# Patient Record
Sex: Female | Born: 1965 | Race: White | Hispanic: No | Marital: Married | Smoking: Current every day smoker
Health system: Southern US, Community
[De-identification: ages and names within clinical notes are randomized; demographics above are authoritative.]

## PROBLEM LIST (undated history)

## (undated) DIAGNOSIS — J309 Allergic rhinitis, unspecified: Secondary | ICD-10-CM

## (undated) DIAGNOSIS — K449 Diaphragmatic hernia without obstruction or gangrene: Secondary | ICD-10-CM

## (undated) DIAGNOSIS — Z91038 Other insect allergy status: Secondary | ICD-10-CM

## (undated) DIAGNOSIS — K5792 Diverticulitis of intestine, part unspecified, without perforation or abscess without bleeding: Secondary | ICD-10-CM

## (undated) DIAGNOSIS — J329 Chronic sinusitis, unspecified: Secondary | ICD-10-CM

## (undated) DIAGNOSIS — E222 Syndrome of inappropriate secretion of antidiuretic hormone: Secondary | ICD-10-CM

## (undated) DIAGNOSIS — Z1211 Encounter for screening for malignant neoplasm of colon: Secondary | ICD-10-CM

## (undated) DIAGNOSIS — M654 Radial styloid tenosynovitis [de Quervain]: Secondary | ICD-10-CM

## (undated) DIAGNOSIS — I10 Essential (primary) hypertension: Secondary | ICD-10-CM

## (undated) DIAGNOSIS — J302 Other seasonal allergic rhinitis: Secondary | ICD-10-CM

## (undated) DIAGNOSIS — E782 Mixed hyperlipidemia: Principal | ICD-10-CM

## (undated) DIAGNOSIS — E871 Hypo-osmolality and hyponatremia: Secondary | ICD-10-CM

## (undated) DIAGNOSIS — N3001 Acute cystitis with hematuria: Principal | ICD-10-CM

## (undated) HISTORY — DX: Other insect allergy status: Z91.038

## (undated) HISTORY — DX: Diverticulitis of intestine, part unspecified, without perforation or abscess without bleeding: K57.92

## (undated) HISTORY — DX: Allergic rhinitis, unspecified: J30.9

## (undated) HISTORY — DX: Diaphragmatic hernia without obstruction or gangrene: K44.9

---

## 2008-04-03 NOTE — Op Note (Unsigned)
PATIENT NAME                  PA #             MR #                Susan [name], Susan [name]                 [id number]       [id number]          SURGEON                                 SURG DATE   DIS DATE         Elson Clan, MD                   04/03/2008  04/03/2008       DATE OF BIRTH    AGE            PATIENT   TYPE      RM #             18-Nov-1965       42             SDA                                 ENDOSCOPY. HISTORY:  The patient is a 43 year old female with   a three month history ofpyrosis, Nexium resulted in headaches, Kapidex has   been only partiallyhelpful.  She denies dysphagia, odynophagia or melenic   stools. MONITORING:  Oxygen, saturation, blood pressure and heart rate were   monitoredcontinuously. MEDICATIONS FOR THE PROCEDURE:  Fentanyl 100   micrograms, Versed 5 mg. DESCRIPTION OF PROCEDURE:  Informed consent was   obtained. The patients pastmedical history, medication list and allergies were   reviewed.  The risks andbenefits and alternatives of procedures were as well   discussed.  The patientwas then placed in the left lateral decubitus position,   IV sedation wasstarted in sequential fashion until the appropriate level of   consciousnesswas achieved.  Hurricaine spray was applied to the back of the   throat.  Theendoscope was then advanced in direct visualization of the tongue,   esophagusand duodenum.  Duodenum mucosal abnormalities were not visualized.    Antegradeand retrograde views of the stomach demonstrated a small hiatal   hernia.  Thescope was then withdrawn through the hiatal hernia and a regular   Z-line wasappreciated. Biopsies were obtained to rule out Barretts esophagus.    Furtherexamination of the esophagus failed to demonstrate obvious   abnormalities. The scope was then withdrawn, the patients procedure terminated   and welltolerated, no immediate complications.   POSTPROCEDURE DIAGNOSIS:  A   regular Z-line hiatal hernia.   I suspect her symptoms are related to acid    reflux.  I have asked her to startProtonix and to discontinue Kapidex and   followup in my office in March.  Iwill contact her with biopsy results.                            Elson Clan, MD [place] /[id number] DD: 04/03/2008  DT:   04/04/2008  Job #: [id number] CC:

## 2008-11-17 LAB — POCT RAPID STREP A: Strep A Ag: NOT DETECTED

## 2008-11-17 NOTE — Progress Notes (Signed)
SUBJECTIVE:   Susan [name] is a 43 y.o. female who complains of congestion, sore throat and headache for 2 days. She denies a history of fevers and denies a history of asthma. Patient admits to smoke cigarettes.     OBJECTIVE:  She appears well, vital signs are as noted. Ears normal.  Throat and pharynx normal.  Neck supple. No adenopathy in the neck. Nose is congested. Sinuses non tender. The chest is clear, without wheezes or rales.    ASSESSMENT:   viral upper respiratory illness    PLAN:  Symptomatic therapy suggested: push fluids, rest and return office visit prn if symptoms persist or worsen. Lack of antibiotic effectiveness discussed with her. Call or return to clinic prn if these symptoms worsen or fail to improve as anticipated. Will write rx for antibiotic as [place] her in case symptoms worsen.

## 2009-01-19 NOTE — Telephone Encounter (Signed)
Suggested the pt be seen before she starts the antibiotic

## 2009-01-19 NOTE — Telephone Encounter (Signed)
Patient [place]  Now has a sinus infection, can she start taking this to help her kick this (sinus/throat/congestion/drainage/some SOB because she coughs so much), can you call her tonight, advise

## 2009-01-20 MED ORDER — AMOXICILLIN-POT CLAVULANATE 875-125 MG PO TABS
875-125 | ORAL_TABLET | Freq: Two times a day (BID) | ORAL | 0.00 refills | 9.50000 days | Status: AC
Start: 2009-01-20 — End: 2009-02-03

## 2009-01-20 MED ORDER — FLUTICASONE PROPIONATE 50 MCG/ACT NA SUSP
50 | Freq: Every day | NASAL | 0.00 refills | 60.00000 days | Status: DC
Start: 2009-01-20 — End: 2009-07-24

## 2009-01-20 NOTE — Progress Notes (Signed)
Subjective:      Patient ID: Susan [name] is a 43 y.o. female.    Sinusitis  This is a new problem. Episode onset: 3 weeks. The problem has been gradually worsening since onset. There has been no fever. The pain is moderate. Associated symptoms include congestion, coughing and sinus pressure. Pertinent negatives include no shortness of breath. She has tried oral decongestants (zyrtec-d,mucinex) for the symptoms. The treatment provided no relief.       Review of Systems   HENT: Positive for congestion, rhinorrhea, postnasal drip and sinus pressure.    Respiratory: Positive for cough. Negative for shortness of breath.        Objective:   Physical Exam   Vitals reviewed.  Constitutional: She appears well-developed. No distress.   HENT:   Right Ear: External ear normal.   Mouth/Throat: Oropharynx is clear and moist. No oropharyngeal exudate.   Cardiovascular: Normal rate, regular rhythm and normal heart sounds.    Pulmonary/Chest: Effort normal and breath sounds normal. She has no wheezes. She has no rales.   Abdominal: Soft.   Lymphadenopathy:     She has no cervical adenopathy.       Assessment:      sinusitis      Plan:      augmentin 875 bid for 10-14 d , flonase , mucinex

## 2009-01-24 ENCOUNTER — Inpatient Hospital Stay: Admit: 2009-01-24 | Discharge: 2009-01-27

## 2009-01-24 LAB — CBC WITH AUTO DIFFERENTIAL
Basophils %: 2.7 % — ABNORMAL HIGH (ref 0.0–2.0)
Basophils Absolute: 0.2 10*3 (ref 0.0–0.2)
Eosinophils %: 0.7 % (ref 0.0–5.0)
Eosinophils Absolute: 0.1 10*3 (ref 0.0–0.6)
Granulocyte Absolute Count: 6.3 10*3 (ref 1.7–7.7)
Hematocrit: 39.6 % (ref 36.0–48.0)
Hemoglobin: 13.3 g/dL (ref 12.0–16.0)
Lymphocytes %: 15.6 % — ABNORMAL LOW (ref 25.0–40.0)
Lymphocytes Absolute: 1.3 10*3 (ref 1.0–5.1)
MCH: 32.8 pg (ref 26–34)
MCHC: 33.7 g/dL (ref 31–36)
MCV: 97.6 fl (ref 80–100)
MPV: 7 fl (ref 5.0–10.5)
Monocytes %: 7.3 %
Monocytes Absolute: 0.6 10*3
Platelets: 337 10*3 (ref 135–450)
RBC: 4.06 10*6 (ref 4.0–5.2)
RDW: 13.8 % (ref 11.5–14.5)
Segs Relative: 73.7 % — ABNORMAL HIGH (ref 42.0–63.0)
WBC: 8.5 10*3 (ref 4.0–11.0)

## 2009-01-24 LAB — POCT GLUCOSE: POC Glucose: 84 mg/dL (ref 70–99)

## 2009-01-24 LAB — URINALYSIS WITH MICROSCOPIC
Bilirubin, Urine: NEGATIVE
Glucose, UA: NEGATIVE
Ketones, Urine: NEGATIVE
Leukocyte Esterase, Urine: NEGATIVE
Nitrite, Urine: NEGATIVE
Protein, UA: NEGATIVE
Specific Gravity, UA: 1.01
Urobilinogen, Urine: 0.2 EU/dl (ref <2.0)
pH, UA: 7 (ref 4.5–8.0)

## 2009-01-24 LAB — BASIC METABOLIC PANEL
BUN: 6 mg/dL — ABNORMAL LOW (ref 7–18)
CO2: 24 meq/L (ref 21–32)
Calcium: 9.2 mg/dL (ref 8.3–10.6)
Chloride: 105 meq/L (ref 99–110)
Creatinine: 0.7 mg/dL (ref 0.6–1.1)
GFR Est, African/Amer: >60
GFR, Estimated: >60 (ref >60)
Glucose: 94 mg/dL (ref 70–99)
Potassium: 5.3 meq/L — ABNORMAL HIGH (ref 3.5–5.1)
Sodium: 138 meq/L (ref 136–145)

## 2009-01-24 LAB — TROPONIN: Troponin I: <0.006 ng/ml

## 2009-01-24 NOTE — Telephone Encounter (Signed)
Dr Braulio Bosch called from the ER, Cynai has a bit of a rapid heartbeat but all tests are normal.  He asked that she be seen 01-25-09 just to do a quick follow up. I spoke with danielle about this and she is going to see her.

## 2009-01-24 NOTE — Telephone Encounter (Signed)
Pt. Was seen last sat. For cold symptoms. Husband called to let you know she is worse and passed out. He was going to take her to the ER.

## 2009-01-25 NOTE — Progress Notes (Signed)
Susan [name] is a 43 y.o. female who was seen in the ER yesterday for feeling "out of sorts" and  heart racing/ no chest pain lasted about 1 hour.  She did not pass out.  The heart racing has not happened before.  She is still complaining of congestion and sinus pressure, she started Augmentin about 4 days ago for this reason.  She notes feeling fatigued for about 3 weeks which is also the duration of time she has been sick.      ER notes/labs reviewed.    O:   BP 110/78  Pulse 88  Wt 119 lb 9.6 oz (54.25 kg)  General appearance: alert, well appearing, and in no distress, oriented to person, place, and time and normal appearing weight.  S1 and S2 normal, no murmurs, clicks, gallops or rubs. Regular rate and rhythm. Chest is clear; no wheezes or rales. No edema or JVD.  ENT exam reveals - both TM fluid noted, neck without nodes, sinuses nontender, post nasal drip noted and nasal mucosa congested.  Lymph: no cervical or supraclavicular adenopathy noted    A:   Encounter Diagnoses   Name Primary?   . Acute sinusitis Yes   . Fatigue        P:   Recommend complete antibiotic treatment and if fatigue persists return to office for further evaluation.  Fatigue likely secondary to illness.    Highly encouraged smoking cessation.    Theodoro Kalata PA-C

## 2009-05-09 LAB — POCT RAPID STREP A: Strep A Ag: NOT DETECTED

## 2009-05-09 NOTE — Progress Notes (Signed)
SUBJECTIVE:   Susan [name] is a 44 y.o. year old female who complains of moderate dry cough, post nasal drip, sinus and nasal congestion and sore throat for 5 days. Symptoms are worsening over that time. She denies a history of sweats, chills and fevers and denies a history of asthma.  She has taken nyquil which helped some.  Sick contacts: several illnesses, no known strep.    History   Substance Use Topics   ??? Tobacco Use: Yes -- 0.5 packs/day   ??? Alcohol Use: No          Current outpatient prescriptions   Medication Sig Dispense Refill   ??? Cetirizine HCl (ZYRTEC ALLERGY PO) Take  by mouth.       ??? Pseudoephedrine HCl (SUDAFED 12 HOUR PO) Take  by mouth.       ??? guaifenesin (MUCINEX) 600 MG SR tablet Take 1,200 mg by mouth 2 times daily.       ??? fluticasone (FLONASE) 50 MCG/ACT nasal spray 1 spray by Nasal route daily.  1 Bottle  2   ??? pantoprazole (PROTONIX) 40 MG tablet Take 40 mg by mouth daily.             Allergies   Allergen Reactions   ??? Sulfa Drugs Hives   ??? Cipro Xr Other (See Comments)     Muscle pain       OBJECTIVE:  BP 130/64   Temp 98.7 ??F (37.1 ??C)   Wt 120 lb 6.4 oz (54.613 kg)  General appearance: alert, well appearing, and in no distress.  S1 and S2 normal, no murmurs, clicks, gallops or rubs. Regular rate and rhythm. Chest is clear; no wheezes or rales.   ENT exam reveals - bilateral TM fluid noted, neck without nodes, sinuses nontender, post nasal drip noted and nasal mucosa congested.  The neck is supple and free of adenopathy or masses, the thyroid is normal without enlargement    ASSESSMENT:   per diagnosis    PLAN:  per orders  Strongly encourage smoking cessation  Symptomatic therapy also suggested: push fluids, rest, use ibuprofen prn and ROV prn if symptoms persist or worsen. Call or return to office prn if these symptoms worsen or fail to improve.    Theodoro Kalata PA-C

## 2009-06-12 MED ORDER — LIDOCAINE HCL (PF) 1 % IJ SOLN
1 | Freq: Once | INTRAMUSCULAR | Status: AC | PRN
Start: 2009-06-12 — End: 2009-06-12

## 2009-06-12 MED ADMIN — lactated ringers infusion: INTRAVENOUS | @ 11:00:00 | NDC 00338011704

## 2009-06-12 NOTE — Plan of Care (Signed)
Post-Operative:  1.  The patient is at or returning to pre-op pulmonary and cardiovascular status at the      conclusion of the immediate postoperative period.  2.  The patient is free from signs and symptoms of chemical, electrical, radiation,      positioning or transfer/transport injury.  3.  The patient/caregiver demonstrates knowledge of medication, pain, and wound      management.  4.  The patient/caregiver demonstrates knowledge of the expected responses to the      operative or invasive procedure.  5.  The patient demonstrates and/or reports adequate pain control throughout the      perioperative period.  6. Patient understands signs and symptoms of surgical site infections and verbalizes importance of hand hygiene.

## 2009-06-12 NOTE — Progress Notes (Signed)
Bleeding in bowels.  Miralax for prep.  Good results

## 2009-06-12 NOTE — Discharge Instructions (Signed)
DISCHARGE INSTRUCTIONS  You have had Colonoscopy      1. You received Medications for Sedation. Therefore:   You may experience light headedness for the next several hours. Plan on quiet relaxation for the remainder of the day.   You should not engage in activity requiring memory or coordination for the remainder of the day. In particular, you should not drive, operate machinery, conduct business affairs, or sign contracts.    You should not consume any alcoholic beverages today or tonight.   You should not take sleeping medication unless one has been specifically    prescribed for you by a physician.    2. Eat a light first meal and then progress back to your regular diet.     3. You may resume taking aspirin.    4. Please see the Medication Reconciliation Record regarding your other medications.    5. Do not expect to have a normal bowel movement for several days as you had cleansing of the large intestine prior to the procedure.    6. Please call the office,  If you do not hear from us within 7 days regarding pending biopsy or lab results.    7. If you experience rectal bleeding, fever or chills, or significant abdominal pain, call me.    8. I requested that you receive information regarding the following:  No Dietary Fiber  No Diverticulosis  No Polyps & Cancer No IBS  No Hemorrhoids  No Instructions re Aspirin     9.     Susan [name] Susan [name]  [phone]

## 2009-06-12 NOTE — Plan of Care (Signed)
Intraoperative:  1.  The patient is free from signs and symptoms of impaired skin.  2.  The patient is free from signs and symptoms of infection.  3.  The patient is free from signs and symptoms of chemical, electrical, radiation,      positioning, or transfer/transport injury.  4.  The patient has wound/tissue perfusion consistent with or improved from baseline      levels established preoperatively.  5.  The patient is the recipient of competent and ethical care within legal standards of      practice.

## 2009-06-12 NOTE — H&P (Signed)
Pre-sedation Assessment    History and Physical / Pre-Sedation Assessment  Patient:  Susan [name]   DOB:   07/02/1965     Intended Procedure: cls      HPI: rectal bleed  Nurses notes reviewed and agreed.  Medications reviewed  Allergies: Allergies   Allergen Reactions   ??? Sulfa Drugs Hives   ??? Cipro Xr Other (See Comments)     Muscle pain             Physical Exam:  Vital Signs: BP 97/69   Pulse 116   Temp(Src) 98 ??F (36.7 ??C) (Temporal)   Resp 16   Ht 5\' 2"  (1.575 m)   Wt 116 lb (52.617 kg)   BMI 21.22 kg/m2   SpO2 98%   LMP 06/11/2009   Breastfeeding? No Body mass index is 21.22 kg/(m^2).  Airway:Normal  Cardiac:Normal  Pulmonary:Normal  Abdomen:Normal  Specific to procedure:       Pre-Procedure Assessment/Plan:  ASA 2 - Patient with mild systemic disease with no functional limitations    Level of Sedation Plan:Mild sedation    Post Procedure plan: Return to same level of care    I assessed the patient and find that the patient is in satisfactory condition to proceed with the planned procedure and sedation plan.    I have explained the risk, benefits, and alternatives to the procedure. The patient understands and agrees to proceed.  Yes    Kumiko Fishman JOSE Adit Riddles  7:32 AM 06/12/2009

## 2009-06-12 NOTE — Op Note (Signed)
PATIENT NAME:                 PA #:            MR #Susan [name], Susan [name]                 [id number]       [id number]            SURGEON:                              SURG DATE:  DIS DATE:          Elson Clan, MD                 06/12/2009                      DATE OF BIRTH:   AGE:           PATIENT TYPE:     RM #:              Sep 06, 1965       43             SDA                                     REFERRING PHYSICIAN:  Tenna Delaine, MD     PATIENT HISTORY:  The patient is a 44 year old female, who was seen in the  office on the 18th.  A week prior to that she had periumbilical discomfort  followed by diarrhea and subsequent hematochezia.  Since that time her bowel  movements have normalized, and her tenderness has improved.  Her p.o. intake  has normalized.     MONITORING:  Oxygen saturation, blood pressure, and heart rate were monitored  continuously.      MEDICATIONS FOR THE PROCEDURE INCLUDE:  Fentanyl 100 mcg, Versed 7 mg.     DESCRIPTION OF PROCEDURE:  Informed consent was obtained.  The patients past  medical history, medication list, and allergies were reviewed.  Risks,  benefits, alternatives, and colon polyp miss rate were discussed.     The patient was then placed in the left lateral decubitus position.  IV  sedation was started in sequential fashion until the appropriate level of  consciousness was achieved.  Digital rectal examination was unremarkable.   Colonoscope was then advanced from the anus to the cecum.  The appendiceal  orifice and ileocecal valve were visualized.  Cecum was photographed.   Colonoscope was then slowly withdrawn.  Each colonic segment was carefully  evaluated.  In the right colon, 2 distinct patchy areas of mucosal  inflammation were appreciated.  This seemed to be present in various forms of  healing.  Biopsies were obtained.  Further examination of the colon did not  demonstrate mucosal abnormalities.  Retroflex view of the rectum was  unremarkable.  The  colonoscope was then withdrawn from the patient.  The  procedure was terminated.  It was well tolerated.  There were no immediate  complications.  The prep was adequate.  Colonoscope withdrawal time exceeded  6 minutes.     POSTPROCEDURE DIAGNOSIS:  Resolving colitis.     PLAN:  I suspect that she likely had an  infectious process that is resolving.   I will contact her with biopsy results, which will dictate further  management.                                            Roylene Heaton Derek Jack, MD     [id number]  DD: 06/12/2009 07:52   DT: 06/12/2009 10:13   Job #: [id number]  CC: Howell Pringle, MD  CC: Onnie Hatchel Derek Jack, MD

## 2009-06-12 NOTE — Plan of Care (Signed)
Pre-Operative:  1.  The patient is free from signs and symptoms of injury.  2.  The patient/caregiver participates in decisions affecting his or her Perioperative plan      of care.  3.  The patient's right to privacy is maintained.  4.  The Patient is the recipient of competent and ethical care within legal standards of      practice.  5.  The patient/caregiver has reduced anxiety.  Interventions - Familiarize with      environment and equipment.  6. Patient understands signs and symptoms of surgical site infections and verbalizes importance of hand hygiene.

## 2009-06-12 NOTE — Brief Op Note (Signed)
Post-Sedation Assessment  Susan [name]   06/12/2009  A pre-sedation re-evaluation was performed immediately prior to beginning the procedure.  Procedure: cls  Preprocedure Dx: rectal bl  Postprocedure Dx: colitis  Medications: Procedural sedation with fentanyl, versed  Complications: None  Estimated Blood Loss: <5cc  Specimens: were obtained    Susan [name] Susan [name]

## 2009-06-12 NOTE — Plan of Care (Signed)
POCT  Pregnancy Test:  neg    Blood Glucose:      (Normal Range 70-99)    PT:      (Normal Range 10-13.5)    INR:      (Normal Range 0.85-1.15)

## 2009-06-14 MED FILL — MIDAZOLAM HCL 5 MG/ML IJ SOLN: 5 MG/ML | INTRAMUSCULAR | Qty: 2

## 2009-06-15 MED FILL — LACTATED RINGERS IV SOLN: INTRAVENOUS | Qty: 1000

## 2009-07-24 NOTE — Progress Notes (Signed)
Subjective:      Patient ID: Susan [name] is a 44 y.o. female.    Agree with MA note: "Can't hear out of rt ear and feels like full of fluid since yesterday had cold sx before this"    Nursing note/chief complaint reviewed and discussed with patient.    Vital signs, Allergies. Past Medical, Social  and Surgical Histories as well as Family History reviewed and discussed with patient.    Otalgia   There is pain in the right ear. This is a new problem. The current episode started in the past 7 days. The problem occurs constantly. The problem has been gradually worsening. There has been no fever. The pain is at a severity of 8/10. The pain is severe. Associated symptoms include coughing, hearing loss and a sore throat. Pertinent negatives include no headaches, rash or vomiting. Treatments tried: Zyrtec, Vicks, Afrin, Sinus Rinse, Sudafed. The treatment provided no relief.       Review of Systems   Constitutional: Negative for fever and chills.   HENT: Positive for hearing loss, ear pain (Right), sore throat, facial swelling (right sided), sneezing, postnasal drip and sinus pressure (rt maxillary). Negative for trouble swallowing and voice change.    Respiratory: Positive for cough.    Gastrointestinal: Negative for nausea and vomiting.   Skin: Negative for rash.   Neurological: Negative for headaches.   Hematological: Positive for adenopathy.   All other systems reviewed and are negative.        Objective:   Physical Exam   Nursing note and vitals reviewed.  Constitutional: She is oriented to person, place, and time. She appears well-developed and well-nourished. She appears distressed.   HENT:   Head: Normocephalic.   Right Ear: External ear normal. Tympanic membrane is erythematous. A middle ear effusion is present. Decreased hearing is noted.   Left Ear: External ear normal. Tympanic membrane is not injected and not erythematous. A middle ear effusion is present. No decreased hearing is noted.   Nose: Mucosal edema  present. No rhinorrhea. Right sinus exhibits maxillary sinus tenderness. Right sinus exhibits no frontal sinus tenderness. Left sinus exhibits maxillary sinus tenderness. Left sinus exhibits no frontal sinus tenderness.   Mouth/Throat: Oropharynx is clear and moist and mucous membranes are normal.   Eyes: Conjunctivae are normal. Right eye exhibits no discharge. Left eye exhibits no discharge.   Neck: Normal range of motion. Neck supple.   Cardiovascular: Normal rate, regular rhythm, normal heart sounds and intact distal pulses.    Pulmonary/Chest: Effort normal and breath sounds normal. No respiratory distress.   Musculoskeletal: Normal range of motion.   Lymphadenopathy:     She has cervical adenopathy.        Right cervical: Posterior cervical adenopathy present.        Left cervical: No posterior cervical adenopathy present.   Neurological: She is alert and oriented to person, place, and time.   Skin: Skin is warm and dry.   Psychiatric: She has a normal mood and affect.       Assessment:      1. Otitis media  amoxicillin-clavulanate (AUGMENTIN) 875-125 MG per tablet   2. Sinusitis  amoxicillin-clavulanate (AUGMENTIN) 875-125 MG per tablet, mometasone (NASONEX) 50 MCG/ACT nasal spray   3. ETD (eustachian tube dysfunction)  predniSONE (DELTASONE) 20 MG tablet, mometasone (NASONEX) 50 MCG/ACT nasal spray   4. Lymphadenopathy  predniSONE (DELTASONE) 20 MG tablet           Plan:  Stop/Slow down your smoking- it delays healing and increases the likelihood of illness  Push plenty of fluids  Neti Pot at bedtime and as needed   Antibiotics until gone (Rx given) (Augmentin)  Nasonex as needed for ear/nasal congestion (Rx given)  Prednisone as directed (Rx given)  Tylenol/Ibuprofen as needed for pain / fever  F/U  in 5-7 days if not improving   Otitis Media (general) instructions given

## 2009-07-24 NOTE — Patient Instructions (Signed)
Stop/Slow down your smoking- it delays healing and increases the likelihood of illness  Push plenty of fluids  Neti Pot at bedtime and as needed   Antibiotics until gone (Rx given) (Augmentin)  Nasonex as needed for ear/nasal congestion (Rx given)  Prednisone as directed (Rx given)  Tylenol/Ibuprofen as needed for pain / fever  F/U  in 5-7 days if not improving   Otitis Media (general) instructions given  Otitis Media   GENERAL INFORMATION:   What is it? Otitis (o-TI-tis) media is an infection (in-FECK-shun) of the middle ear (the space behind the eardrum). This infection is very common in young children, but people of any age can get it. You should feel better two to three days after starting your medicine. If the ear infection is not treated, your eardrum may burst or the infection may spread. Frequent ear infections can cause life-long hearing problems. If your ear infection is being treated with antibiotics, always take them exactly as directed by your caregiver.        What causes an ear infection?   ?? You may get an ear infection when your eustachian (u-STAY-shun) tubes become swollen or blocked. Eustachian tubes are tiny tubes that connect the middle ear to the back of the nose and throat. Eustachian tubes drain fluid away from the middle ear. They keep fresh air flowing in and out of the ears and control air pressure in the middle ear. Fresh air and the right pressure are needed so that you can hear properly.    ?? When eustachian tubes become blocked, usually because of a cold or allergy, fluid cannot drain from the ear. Fluid that is trapped behind the eardrum is a perfect place for germs called bacteria (bak-TEER-e-uh) to grow. As the trapped fluid builds up, it puts increased pressure against the eardrum. If too much pressure builds up, the eardrum may burst. This is usually not a serious problem, because with time, the eardrum repairs itself.   What are the signs and symptoms of an ear infection?   ?? Ear  pain.    ?? Fever.    ?? Trouble hearing.    ?? Your ear may feel plugged or full. You may have ringing or buzzing in your ear.    ?? Headache.    ?? Dizziness or loss of balance.     ?? Nausea (upset stomach) or vomiting (throwing up).     ?? You may also have fluid leaking from your ear if the eardrum has burst.   How is an ear infection diagnosed? Your caregiver will use an otoscope (O-tuh-skop) to look inside your ears. The caregiver may blow a puff of air inside your ears. The otoscope and air help your caregiver to see if your eardrums look healthy. If your ear is infected, the eardrum may be red and bulging. A normal eardrum is able to move a small amount. If there is fluid or pus behind it, the eardrum will not move the way it normally does. A tympanogram (tim-PAN-o-gram) is another test that may be done. It can help your caregiver learn if you have fluid in your middle ear. During the test, an earplug is put into each of your ears to see how the eardrum moves.   How is an ear infection treated?   ?? Your caregiver will plan your treatment based on any past ear problems and the type of infection you have. Acetaminophen (uh-c-tuh-MIN-o-fin) and ibuprofen (i-bew-PRO-fin) may help stop your ear pain  and fever. You may also be given eardrops to treat your ear pain. Your caregiver may or may not choose to give you antibiotic (an-ti-bi-AH-tik) medicine.    ?? Sometimes, otitis media does not go away easily. Your caregiver may start you on a dose of antibiotics, and then decide to give you a higher dose. You may need to try several different antibiotic medicines to make your otitis media go away. This is because your infection may be caused by bacteria that is resistant to (cannot be killed by) the antibiotic you are taking. If you are taking antibiotics, take them exactly as ordered by your caregiver. Keep taking them until the last one is gone, even if you feel better. Stopping your antibiotic before your caregiver tells  you to may cause bacteria to form resistance to that antibiotic.   CARE AGREEMENT:   You have the right to help plan your care. To help with this plan, you must learn about your health condition and how it may be treated. You can then discuss treatment options with your caregivers. Work with them to decide what care may be used to treat you. You always have the right to refuse treatment.   Copyright ?? 2009. [employer]. All rights reserved. Information is for End User's use only and may not be sold, redistributed or otherwise used for commercial purposes.  The above information is an educational aid only. It is not intended as medical advice for individual conditions or treatments. Talk to your doctor, nurse or pharmacist before following any medical regimen to see if it is safe and effective for you.

## 2010-05-09 ENCOUNTER — Inpatient Hospital Stay
Admit: 2010-05-09 | Discharge: 2010-05-09 | Disposition: A | Discharge status: Home / Self Care | Attending: Emergency Medicine

## 2010-05-09 LAB — CBC WITH AUTO DIFFERENTIAL
Basophils %: 0 % (ref 0.0–2.0)
Basophils Absolute: 0 10*3 (ref 0.0–0.2)
Eosinophils %: 0.8 % (ref 0.0–5.0)
Eosinophils Absolute: 0.1 10*3 (ref 0.0–0.6)
Granulocyte Absolute Count: 7.4 10*3 (ref 1.7–7.7)
Hematocrit: 41.9 % (ref 36.0–48.0)
Hemoglobin: 14.3 gm/dl (ref 12.0–16.0)
Lymphocytes %: 17.5 % — ABNORMAL LOW (ref 25.0–40.0)
Lymphocytes Absolute: 1.6 10*3 (ref 1.0–5.1)
MCH: 33 pg (ref 26–34)
MCHC: 34 gm/dl (ref 31–36)
MCV: 97 fl (ref 80–100)
MPV: 8.2 fl (ref 5.0–10.5)
Monocytes %: 2.9 % (ref 0.0–12.0)
Monocytes Absolute: 0.3 10*3 (ref 0.0–1.3)
Platelets: 342 10*3 (ref 135–450)
RBC: 4.32 10*6 (ref 4.0–5.2)
RDW: 14.2 % (ref 11.5–14.5)
Segs Relative: 78.8 % — ABNORMAL HIGH (ref 42.0–63.0)
WBC: 9.4 10*3 (ref 4.0–11.0)

## 2010-05-09 LAB — COMPREHENSIVE METABOLIC PANEL
ALT: 12 U/L (ref 10–40)
AST: 18 U/L (ref 15–37)
Albumin: 4.5 gm/dl (ref 3.4–5.0)
Alkaline Phosphatase: 53 U/L (ref 45–129)
BUN: 6 mg/dl — ABNORMAL LOW (ref 7–18)
CO2: 25 mEq/L (ref 21–32)
Calcium: 10.3 mg/dl (ref 8.3–10.6)
Chloride: 104 mEq/L (ref 99–110)
Creatinine: 0.8 mg/dl (ref 0.6–1.1)
GFR Est, African/Amer: >60
GFR, Estimated: >60 (ref >60)
Glucose: 87 mg/dl (ref 70–99)
Potassium: 4 mEq/L (ref 3.5–5.1)
Sodium: 139 mEq/L (ref 136–145)
Total Bilirubin: 0.51 mg/dl (ref 0.0–1.0)
Total Protein: 7.3 gm/dl (ref 6.4–8.2)

## 2010-05-09 LAB — CK: Total CK: 97 U/L (ref 26–140)

## 2010-05-09 NOTE — ED Provider Notes (Signed)
HPI  History of Present Illness     Patient Identification  Susan [name] is a 45 y.o. female.    Chief Complaint   Other      History of Present Illness:    This is a  45 y.o. female who presents with, "throbbing of the veins of the left leg and right hand and wrist," which started today. Pt recently started on Pravastatin. Pt recently had a CT of abdomen in [place] for hematuria which showed mild to moderate abdominal aortic calcification. Pt is a smoker. No SOB, CP, numbness. No other complaints, modifying factors, or associated symptoms. Pt just drove from [place] last night.   She has no weakness or numbness in arms or legs.      Past Medical History   Diagnosis Date   ??? GERD (gastroesophageal reflux disease)    ??? Hiatal hernia        No current facility-administered medications for this encounter.     Current Outpatient Prescriptions   Medication Sig Dispense Refill   ??? Loratadine (CLARITIN) 10 MG CAPS Take  by mouth.         ??? pravastatin (PRAVACHOL) 20 MG tablet Take 20 mg by mouth daily.         ??? mometasone (NASONEX) 50 MCG/ACT nasal spray 2 sprays by Nasal route daily as needed (Take 1 -2 sprays each nostril once daily as needed for ear congestion). Sample given  17 g  0   ??? pantoprazole (PROTONIX) 40 MG tablet Take 40 mg by mouth daily.           Allergies   Allergen Reactions   ??? Cipro Xr Other (See Comments)     Muscle pain   ??? Sulfa Drugs Hives       History     Social History   ??? Marital Status: Married     Spouse Name: N/A     Number of Children: N/A   ??? Years of Education: N/A     Occupational History   ??? Not on file.     Social History Main Topics   ??? Smoking status: Current Everyday Smoker -- 0.5 packs/day   ??? Smokeless tobacco: Not on file   ??? Alcohol Use: No   ??? Drug Use:    ??? Sexually Active:      Other Topics Concern   ??? Not on file     Social History Narrative   ??? No narrative on file       Nursing Notes Reviewed    Review of Systems  10 systems reviewed, pertinent positives per HPI  otherwise noted to be negative    Physical Exam   Nursing note and vitals reviewed.  Constitutional: She is oriented to person, place, and time. She appears well-developed and well-nourished.   HENT:   Head: Normocephalic.   Right Ear: External ear normal.   Left Ear: External ear normal.   Eyes: Conjunctivae are normal. Pupils are equal, round, and reactive to light. Right eye exhibits no discharge. Left eye exhibits no discharge.   Neck: Neck supple. No tracheal deviation present.   Cardiovascular: Normal rate, regular rhythm, normal heart sounds and intact distal pulses.  Exam reveals no gallop and no friction rub.    No murmur heard.  Pulses:       Dorsalis pedis pulses are 2+ on the right side, and 2+ on the left side.   Pulmonary/Chest: Effort normal and breath sounds normal. No respiratory  distress. She has no wheezes. She has no rales. She exhibits no tenderness.   Abdominal: Soft. Bowel sounds are normal. She exhibits no distension and no mass. No tenderness. She has no rebound and no guarding.   Musculoskeletal: She exhibits no edema.        No obvious deformity   Neurological: She is alert and oriented to person, place, and time. She has normal strength. No sensory deficit. GCS eye subscore is 4. GCS verbal subscore is 5. GCS motor subscore is 6.   Skin: Skin is warm and dry.   Psychiatric: She has a normal mood and affect. Her behavior is normal.       Procedures    MDM    Radiology  Vascular Extremity Venous Duplex Left    05/09/2010  Reason for exam-->rule out dvt   Left arm venous Doppler ultrasound examination 05/09/2010 at 1609 hours   Indication- Left upper extremity swelling.   Findings- The left basilic vein, the left brachial vein, the left axillary vein and left subclavian vein were visualized and found to be patent by compression and/or color flow Doppler. The left cephalic vein was not identified as a discrete structure.   Opinion- No evidence of left upper extremity venous thrombosis.      Dictated on- 05/09/2010 16-32-27        Read BySerita Sheller  M.D.      Released BySerita Sheller  M.D.      Released Date Time- 05/09/10 1636        Transcriptionist- Serita Sheller  M.D.  ------------------------------------------------------------------------------     Vascular Extremity Venous Duplex Left    05/09/2010  Reason for exam-->swelling   LEFT LOWER EXTREMITY VENOUS DUPLEX ULTRASOUND DATED - 05/09/2010   INDICATION- Swelling   COMPARISON- None   FINDINGS-   Left common femoral, superficial femoral, popliteal, and calf veins appear patent   IMPRESSION-   No DVT noted     Dictated on- 05/09/2010 16-26-50        Read ByVernard Gambles M.D.      Released ByVernard Gambles M.D.      Released Date Time- 05/09/10 1631        Transcriptionist- Vernard Gambles M.D.  ------------------------------------------------------------------------------      5:46 PM ABIs >1    No s/s of rhabdo, ABIs>1, thus no claudication, and no sign of DVT by U/S. Reccommended f/u with PCP may consider stopping statin.     Results for orders placed during the hospital encounter of 05/09/10   CBC WITH AUTO DIFFERENTIAL       Component Value Range    WBC 9.4  4.0 - 11.0 (X 1000)    RBC 4.32  4.0 - 5.2 (X(10)6)    Hemoglobin 14.3  12.0 - 16.0 (gm/dl)    Hematocrit 30.8  65.7 - 48.0 (%)    MCV 97.0  80 - 100 (fl)    MCH 33.0  26 - 34 (pg)    MCHC 34.0  31 - 36 (gm/dl)    RDW 84.6  96.2 - 95.2 (%)    Platelets 342  135 - 450 (X(10)3)    MPV 8.2  5.0 - 10.5 (fl)    Segs Relative 78.8 (*) 42.0 - 63.0 (%)    Lymphocytes Relative 17.5 (*) 25.0 - 40.0 (%)    Monocytes Relative 2.9  0.0 - 12.0 (%)    Eosinophils Relative 0.8  0.0 - 5.0 (%)    Basophils Relative 0.0  0.0 - 2.0 (%)    GRANULOCYTE ABSOLUTE COUNT 7.4  1.7 - 7.7 (X(10)3)    Lymphocytes Absolute 1.6  1.0 - 5.1 (X(10)3)    Monocytes Absolute 0.3  0.0 - 1.3 (X(10)3)    Eosinophils Absolute 0.1  0.0 - 0.6 (X(10)3)    Basophils Absolute 0.0  0.0 - 0.2 (X(10)3)    Differential Type Auto      COMPREHENSIVE METABOLIC PANEL       Component Value Range    Sodium 139  136 - 145 (mEq/L)    Potassium 4.0  3.5 - 5.1 (mEq/L)    Chloride 104  99 - 110 (mEq/L)    CO2 25  21 - 32 (mEq/L)    Glucose 87  70 - 99 (mg/dl)    BUN 6 (*) 7 - 18 (mg/dl)    Creatinine, Ser 0.8  0.6 - 1.1 (mg/dl)    Total Protein 7.3  6.4 - 8.2 (gm/dl)    Alb 4.5  3.4 - 5.0 (gm/dl)    Total Bilirubin 1.61  0.0 - 1.0 (mg/dl)    Alkaline Phosphatase 53  45 - 129 (U/L)    AST 18  15 - 37 (U/L)    ALT 12  10 - 40 (U/L)    Calcium 10.3  8.3 - 10.6 (mg/dl)    GFR, Estimated >09  >60 mL/min/1.65m2     GFR Est, African/Amer >60  SEE BELOW    CK       Component Value Range    Total CK 97  26 - 140 (U/L)     1. Cramps, muscle, general        Disclaimer: All medical record entries made by the scribe, Lamarr Lulas, were at my direction. I have reviewed the chart and agree that the record accurately reflects my personal performance of the history physical exam, medical decision making, and the emergency department course for this patient. I have also personally directed the scribe to generate the discharge instructions.          Blenda Peals, MD  05/09/10 [phone]

## 2010-05-09 NOTE — ED Notes (Signed)
LEFT ER VIA WC TO [place] BY RN    Bing Neighbors, RN  05/09/10 [phone]

## 2017-05-28 ENCOUNTER — Ambulatory Visit: Payer: 59 | Admitting: Allergy and Immunology

## 2017-05-28 ENCOUNTER — Encounter: Payer: Self-pay | Admitting: Allergy and Immunology

## 2017-05-28 VITALS — BP 138/86 | HR 84 | Temp 98.2°F | Resp 16 | Ht 61.6 in | Wt 132.0 lb

## 2017-05-28 DIAGNOSIS — J3089 Other allergic rhinitis: Secondary | ICD-10-CM | POA: Diagnosis not present

## 2017-05-28 DIAGNOSIS — H1013 Acute atopic conjunctivitis, bilateral: Secondary | ICD-10-CM | POA: Diagnosis not present

## 2017-05-28 DIAGNOSIS — H1010 Acute atopic conjunctivitis, unspecified eye: Secondary | ICD-10-CM | POA: Insufficient documentation

## 2017-05-28 MED ORDER — FLUTICASONE PROPIONATE 93 MCG/ACT NA EXHU: 2.0000 | INHALANT_SUSPENSION | Freq: Two times a day (BID) | NASAL | 12 refills | Status: AC

## 2017-05-28 MED ORDER — EPINEPHRINE 0.3 MG/0.3ML IJ SOAJ: 0.3000 mg | Freq: Once | INTRAMUSCULAR | 1 refills | Status: AC

## 2017-05-28 MED ORDER — OLOPATADINE HCL 0.2 % OP SOLN: 1.0000 [drp] | OPHTHALMIC | 5 refills | Status: AC

## 2017-05-28 MED ORDER — CARBINOXAMINE MALEATE 6 MG PO TABS: 6.0000 mg | ORAL_TABLET | Freq: Two times a day (BID) | ORAL | 5 refills | Status: AC

## 2017-05-28 NOTE — Assessment & Plan Note (Signed)
   Treatment plan as outlined above for allergic rhinitis.  A prescription has been provided for Pataday, one drop per eye daily as needed.  I have also recommended eye lubricant drops (i.e., Natural Tears) as needed. 

## 2017-05-28 NOTE — Assessment & Plan Note (Addendum)
The patient will resume aeroallergen immunotherapy under our care.  As she plans to be here for the foreseeable future, we will mix her allergen vials in accordance with her previous allergist's vial contents/dose.  Of note, her skin testing indicate reactivity to cat and mold, and immunotherapy sheets  from her previous allergist indicate that the contents of her immunotherapy vial is cat and mold, however the vial itself, with correct her name and date of birth, indicate that dust mite antigen is in the vial.  For now, continue appropriate allergen avoidance measures.  A prescription has been provided for RyVent (carbinoxamine maleate) 6mg  every 6-8 hours as needed.  [facility], 2 actuations per nostril twice a day. Proper technique has been discussed and demonstrated.  If needed, add azelastine nasal spray, 2 sprays per nostril 1-2 times daily.  Nasal saline spray (i.e., Simply Saline) or nasal saline lavage (i.e., NeilMed) is recommended as needed and prior to medicated nasal sprays.  Return for follow-up in 6 months, or sooner if necessary.

## 2017-05-28 NOTE — Patient Instructions (Signed)
Perennial allergic rhinitis The patient will resume aeroallergen immunotherapy under our care.  As she plans to be here for the foreseeable future, we will mix her allergen vials in accordance with her previous allergist's vial contents/dose.   For now, continue appropriate allergen avoidance measures.  A prescription has been provided for RyVent (carbinoxamine maleate) 6mg  every 6-8 hours as needed.  [facility] 2 actuations per nostril twice a day. Proper technique has been discussed and demonstrated.  If needed, add azelastine nasal spray, 2 sprays per nostril 1-2 times daily.  Nasal saline spray (i.e., Simply Saline) or nasal saline lavage (i.e., NeilMed) is recommended as needed and prior to medicated nasal sprays.  Return for follow-up in 6 months, or sooner if necessary.  Allergic conjunctivitis  Treatment plan as outlined above for allergic rhinitis.  A prescription has been provided for Pataday, one drop per eye daily as needed.  I have also recommended eye lubricant drops (i.e., Natural Tears) as needed.   Return in about 6 months (around 11/28/2017), or if symptoms worsen or fail to improve.

## 2017-05-28 NOTE — Progress Notes (Signed)
New Patient Note  RE: Ocia Fenner MRN: [id number] DOB: Jan 18, 1966 Date of Office Visit: 05/28/2017  Referring provider: No ref. provider found Primary carKarolee Stampse provider: System, Pcp Not In  Chief Complaint: Allergic Rhinitis    History of present illness: Karolee Stampsnnette Pintor is a 52 y.o. female presenting today for evaluation of allergic rhinoconjunctivitis. She experiences nasal congestion, rhinorrhea, sneezing, postnasal drainage, nasal pruritus, ocular pruritus, and lacrimation.  She was started on aeroallergen immunotherapy in March 2018 and reached maintenance dose in October 2018, but has not noticed significant benefit at this point.  Her allergy skin tests were positive to cat and mold.  She has 2 cats in the home. [facility] [place] [facility] continuation of treatment.  She is currently attempting to control her nasal/sinus/ocular symptoms with levocetirizine, montelukast, azelastine nasal spray, fluticasone nasal spray, and cromolyn eyedrops without adequate symptom relief.    Assessment and plan: Perennial allergic rhinitis The patient will resume aeroallergen immunotherapy under our care.  As she plans to be here for the foreseeable future, we will mix her allergen vials in accordance with her previous allergist's vial contents/dose.  Of note, her skin testing indicate reactivity to cat and mold, and immunotherapy sheets  from her previous allergist indicate that the contents of her immunotherapy vial is cat and mold, however the vial itself, with correct her name and date of birth, indicate that dust mite antigen is in the vial.  For now, continue appropriate allergen avoidance measures.  A prescription has been provided for RyVent (carbinoxamine maleate) 6mg  every 6-8 hours as needed.  A prescription has been provided for [facility], 2 actuations per nostril twice a day. Proper technique has been discussed and demonstrated.  If needed, add azelastine  nasal spray, 2 sprays per nostril 1-2 times daily.  Nasal saline spray (i.e., Simply Saline) or nasal saline lavage (i.e., NeilMed) is recommended as needed and prior to medicated nasal sprays.  Return for follow-up in 6 months, or sooner if necessary.  Allergic conjunctivitis  Treatment plan as outlined above for allergic rhinitis.  A prescription has been provided for Pataday, one drop per eye daily as needed.  I have also recommended eye lubricant drops (i.e., Natural Tears) as needed.   Meds ordered this encounter  Medications  . Fluticasone Propionate (XHANCE) 93 MCG/ACT EXHU    Sig: Place 2 puffs into the nose 2 (two) times daily.    Dispense:  32 mL    Refill:  12  . Olopatadine HCl (PATADAY) 0.2 % SOLN    Sig: Place 1 drop into both eyes 1 day or 1 dose.    Dispense:  1 Bottle    Refill:  5  . Carbinoxamine Maleate (RYVENT) 6 MG TABS    Sig: Take 6 mg by mouth 2 (two) times daily.    Dispense:  30 tablet    Refill:  5    RUN AS CASH PAY ONLY! BIN [id number] CardholderID [id number]  . EPINEPHrine (AUVI-Q) 0.3 mg/0.3 mL IJ SOAJ injection    Sig: Inject 0.3 mLs (0.3 mg total) into the muscle once for 1 dose.    Dispense:  2 Device    Refill:  1      Physical examination: Blood pressure 138/86, pulse 84, temperature 98.2 F (36.8 C), temperature source Oral, resp. rate 16, height 5' 1.6" (1.565 m), weight 132 lb (59.9 kg), SpO2 98 %.  General: Alert, interactive, in no acute distress.  HEENT: TMs pearly gray, turbinates moderately edematous with thick discharge, post-pharynx moderately erythematous.  Infraorbital cyanosis and edema bilaterally. Neck: Supple without lymphadenopathy. Lungs: Clear to auscultation without wheezing, rhonchi or rales. CV: Normal S1, S2 without murmurs. Abdomen: Nondistended, nontender. Skin: Warm and dry, without lesions or rashes. Extremities:  No clubbing, cyanosis or edema. Neuro:   Grossly intact.  Review of  systems:  Review of systems negative except as noted in HPI / PMHx or noted below: Review of Systems  Constitutional: Negative.   HENT: Negative.   Eyes: Negative.   Respiratory: Negative.   Cardiovascular: Negative.   Gastrointestinal: Negative.   Genitourinary: Negative.   Musculoskeletal: Negative.   Skin: Negative.   Neurological: Negative.   Endo/Heme/Allergies: Negative.   Psychiatric/Behavioral: Negative.     Past medical history:  Past Medical History:  Diagnosis Date  . 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 Diverticulitis   . Hiatal hernia     Past surgical history:  History reviewed. No pertinent surgical history.  Family history: Family History  Problem Relation Age of Onset  . Asthma Mother   . Allergic rhinitis Mother   . Asthma Brother   . Angioedema Neg Hx   . Eczema Neg Hx   . Immunodeficiency Neg Hx   . Urticaria Neg Hx     Social history: Social History   Socioeconomic History  . Marital status: Married    Spouse name: Not on file  . Number of children: Not on file  . Years of education: Not on file  . Highest education level: Not on file  Social Needs  . Financial resource strain: Not on file  . Food insecurity - worry: Not on file  . Food insecurity - inability: Not on file  . Transportation needs - medical: Not on file  . Transportation needs - non-medical: Not on file  Occupational History  . Not on file  Tobacco Use  . Smoking status: Current Every Day Smoker    Packs/day: 0.50    Types: Cigarettes  . Smokeless tobacco: Never Used  Substance and Sexual Activity  . Alcohol use: Yes  . Drug use: No  . Sexual activity: Not on file  Other Topics Concern  . Not on file  Social History Narrative  . Not on file   Environmental History: The patient lives in a house with carpeting throughout, gas heat, and central air.  There are 2 cats in the home which do not have access to her bedroom.  There is no known mold/water damage in the home.  She  is a cigarette smoker, having smoked half pack per day on average for the past 20 years.  Allergies as of 05/28/2017      Reactions   Ciprofloxacin    Sulfa Antibiotics       Medication List        Accurate as of 05/28/17 10:44 AM. Always use your most recent med list.          atorvastatin 40 MG tablet Commonly known as:  LIPITOR Take 40 mg by mouth daily.   azelastine 0.1 % nasal spray Commonly known as:  ASTELIN Place 2 sprays into both nostrils 2 (two) times daily. Use in each nostril as directed   Carbinoxamine Maleate 6 MG Tabs Commonly known as:  RYVENT Take 6 mg by mouth 2 (two) times daily.   cromolyn 4 % ophthalmic solution Commonly known as:  OPTICROM Place 1 drop into both eyes 4 (four) times  daily.   EPINEPHrine 0.3 mg/0.3 mL Soaj injection Commonly known as:  AUVI-Q Inject 0.3 mLs (0.3 mg total) into the muscle once for 1 dose.   fluticasone 50 MCG/ACT nasal spray Commonly known as:  FLONASE Place 2 sprays into both nostrils 2 (two) times daily.   Fluticasone Propionate 93 MCG/ACT Exhu Commonly known as:  XHANCE Place 2 puffs into the nose 2 (two) times daily.   levocetirizine 5 MG tablet Commonly known as:  XYZAL Take 5 mg by mouth every evening.   montelukast 10 MG tablet Commonly known as:  SINGULAIR Take 10 mg by mouth at bedtime.   Olopatadine HCl 0.2 % Soln Commonly known as:  PATADAY Place 1 drop into both eyes 1 day or 1 dose.   pantoprazole 40 MG tablet Commonly known as:  PROTONIX Take 40 mg by mouth daily.   SYSTANE OP Apply 1 drop to eye 2 (two) times daily.       Known medication allergies: Allergies  Allergen Reactions  . Ciprofloxacin   . Sulfa Antibiotics     I appreciate the opportunity to take part in Justene's care. Please do not hesitate to contact me with questions.  Sincerely,   R. Jorene Guest, MD

## 2017-06-05 ENCOUNTER — Encounter: Payer: Self-pay | Admitting: Emergency Medicine

## 2017-06-05 ENCOUNTER — Ambulatory Visit: Payer: 59 | Admitting: Emergency Medicine

## 2017-06-05 VITALS — BP 132/80 | HR 97 | Temp 99.0°F | Resp 16 | Ht 61.6 in | Wt 132.6 lb

## 2017-06-05 DIAGNOSIS — B349 Viral infection, unspecified: Secondary | ICD-10-CM | POA: Diagnosis not present

## 2017-06-05 DIAGNOSIS — R070 Pain in throat: Secondary | ICD-10-CM | POA: Diagnosis not present

## 2017-06-05 DIAGNOSIS — Z7689 Persons encountering health services in other specified circumstances: Secondary | ICD-10-CM | POA: Diagnosis not present

## 2017-06-05 NOTE — Patient Instructions (Addendum)
IF you received an x-ray today, you will receive an invoice from [facility]. Please contact [facility] at [phone] with questions or concerns regarding your invoice.   IF you received labwork today, you will receive an invoice from [place]. Please contact [facility] at [phone] with questions or concerns regarding your invoice.   Our billing staff will not be able to assist you with questions regarding bills from these companies.  You will be contacted with the lab results as soon as they are available. The fastest way to get your results is to activate your My Chart account. Instructions are located on the last page of this paperwork. If you have not heard from [place] regarding the results in 2 weeks, please contact this office.      Viral Illness, Adult Viruses are tiny germs that can get into a person's body and cause illness. There are many different types of viruses, and they cause many types of illness. Viral illnesses can range from mild to severe. They can affect various parts of the body. Common illnesses that are caused by a virus include colds and the flu. Viral illnesses also include serious conditions such as HIV/AIDS (human immunodeficiency virus/acquired immunodeficiency syndrome). A few viruses have been linked to certain cancers. What are the causes? Many types of viruses can cause illness. Viruses invade cells in your body, multiply, and cause the infected cells to malfunction or die. When the cell dies, it releases more of the virus. When this happens, you develop symptoms of the illness, and the virus continues to spread to other cells. If the virus takes over the function of the cell, it can cause the cell to divide and grow out of control, as is the case when a virus causes cancer. Different viruses get into the body in different ways. You can get a virus by:  Swallowing food or water that is contaminated with the virus.  Breathing in droplets  that have been coughed or sneezed into the air by an infected person.  Touching a surface that has been contaminated with the virus and then touching your eyes, nose, or mouth.  Being bitten by an insect or animal that carries the virus.  Having sexual contact with a person who is infected with the virus.  Being exposed to blood or fluids that contain the virus, either through an open cut or during a transfusion.  If a virus enters your body, your body's defense system (immune system) will try to fight the virus. You may be at higher risk for a viral illness if your immune system is weak. What are the signs or symptoms? Symptoms vary depending on the type of virus and the location of the cells that it invades. Common symptoms of the main types of viral illnesses include: Cold and flu viruses  Fever.  Headache.  Sore throat.  Muscle aches.  Nasal congestion.  Cough. Digestive system (gastrointestinal) viruses  Fever.  Abdominal pain.  Nausea.  Diarrhea. Liver viruses (hepatitis)  Loss of appetite.  Tiredness.  Yellowing of the skin (jaundice). Brain and spinal cord viruses  Fever.  Headache.  Stiff neck.  Nausea and vomiting.  Confusion or sleepiness. Skin viruses  Warts.  Itching.  Rash. Sexually transmitted viruses  Discharge.  Swelling.  Redness.  Rash. How is this treated? Viruses can be difficult to treat because they live within cells. Antibiotic medicines do not treat viruses because these drugs do not get inside cells. Treatment for a viral  illness may include:  Resting and drinking plenty of fluids.  Medicines to relieve symptoms. These can include over-the-counter medicine for pain and fever, medicines for cough or congestion, and medicines to relieve diarrhea.  Antiviral medicines. These drugs are available only for certain types of viruses. They may help reduce flu symptoms if taken early. There are also many antiviral medicines  for hepatitis and HIV/AIDS.  Some viral illnesses can be prevented with vaccinations. A common example is the flu shot. Follow these instructions at home: Medicines   Take over-the-counter and prescription medicines only as told by your health care provider.  If you were prescribed an antiviral medicine, take it as told by your health care provider. Do not stop taking the medicine even if you start to feel better.  Be aware of when antibiotics are needed and when they are not needed. Antibiotics do not treat viruses. If your health care provider thinks that you may have a bacterial infection as well as a viral infection, you may get an antibiotic. ? Do not ask for an antibiotic prescription if you have been diagnosed with a viral illness. That will not make your illness go away faster. ? Frequently taking antibiotics when they are not needed can lead to antibiotic resistance. When this develops, the medicine no longer works against the bacteria that it normally fights. General instructions  Drink enough fluids to keep your urine clear or pale yellow.  Rest as much as possible.  Return to your normal activities as told by your health care provider. Ask your health care provider what activities are safe for you.  Keep all follow-up visits as told by your health care provider. This is important. How is this prevented? Take these actions to reduce your risk of viral infection:  Eat a healthy diet and get enough rest.  Wash your hands often with soap and water. This is especially important when you are in public places. If soap and water are not available, use hand sanitizer.  Avoid close contact with friends and family who have a viral illness.  If you travel to areas where viral gastrointestinal infection is common, avoid drinking water or eating raw food.  Keep your immunizations up to date. Get a flu shot every year as told by your health care provider.  Do not share toothbrushes,  nail clippers, razors, or needles with other people.  Always practice safe sex.  Contact a health care provider if:  You have symptoms of a viral illness that do not go away.  Your symptoms come back after going away.  Your symptoms get worse. Get help right away if:  You have trouble breathing.  You have a severe headache or a stiff neck.  You have severe vomiting or abdominal pain. This information is not intended to replace advice given to you by your health care provider. Make sure you discuss any questions you have with your health care provider. Document Released: 07/20/2015 Document Revised: 08/22/2015 Document Reviewed: 07/20/2015 Elsevier Interactive Patient Education  2018 Elsevier Inc.  Sore Throat When you have a sore throat, your throat may:  Hurt.  Burn.  Feel irritated.  Feel scratchy.  Many things can cause a sore throat, including:  An infection.  Allergies.  Dryness in the air.  Smoke or pollution.  Gastroesophageal reflux disease (GERD).  A tumor.  A sore throat can be the first sign of another sickness. It can happen with other problems, like coughing or a fever. Most sore throats  go away without treatment. Follow these instructions at home:  Take over-the-counter medicines only as told by your doctor.  Drink enough fluids to keep your pee (urine) clear or pale yellow.  Rest when you feel you need to.  To help with pain, try: ? Sipping warm liquids, such as broth, herbal tea, or warm water. ? Eating or drinking cold or frozen liquids, such as frozen ice pops. ? Gargling with a salt-water mixture 3-4 times a day or as needed. To make a salt-water mixture, add -1 tsp of salt in 1 cup of warm water. Mix it until you cannot see the salt anymore. ? Sucking on hard candy or throat lozenges. ? Putting a cool-mist humidifier in your bedroom at night. ? Sitting in the bathroom with the door closed for 5-10 minutes while you run hot water in  the shower.  Do not use any tobacco products, such as cigarettes, chewing tobacco, and e-cigarettes. If you need help quitting, ask your doctor. Contact a doctor if:  You have a fever for more than 2-3 days.  You keep having symptoms for more than 2-3 days.  Your throat does not get better in 7 days.  You have a fever and your symptoms suddenly get worse. Get help right away if:  You have trouble breathing.  You cannot swallow fluids, soft foods, or your saliva.  You have swelling in your throat or neck that gets worse.  You keep feeling like you are going to throw up (vomit).  You keep throwing up. This information is not intended to replace advice given to you by your health care provider. Make sure you discuss any questions you have with your health care provider. Document Released: 12/18/2007 Document Revised: 11/04/2015 Document Reviewed: 12/29/2014 Elsevier Interactive Patient Education  [employer].

## 2017-06-05 NOTE — Progress Notes (Signed)
Vanessa Shaw 52 y.o.   Chief Complaint  Patient presents with  . Establish Care    rt side of throat feels swollen x 2 days     HISTORY OF PRESENT ILLNESS: This is a 52 y.o. female complaining of soreness of her throat for 3-4 days.  Feels like an ache with mild swelling.  No other significant symptoms. Here to establish care.  Has a history of chronic allergies and GERD.  Medications reviewed.  HPI   Prior to Admission medications   Medication Sig Start Date End Date Taking? Authorizing Provider  atorvastatin (LIPITOR) 40 MG tablet Take 40 mg by mouth daily.   Yes [provider]  azelastine (ASTELIN) 0.1 % nasal spray Place 2 sprays into both nostrils 2 (two) times daily. Use in each nostril as directed   Yes [provider]  Carbinoxamine Maleate (RYVENT) 6 MG TABS Take 6 mg by mouth 2 (two) times daily. 05/28/17  Yes Bobbitt, Heywood Iles, MD  Fluticasone Propionate Timmothy Sours) 93 MCG/ACT EXHU Place 2 puffs into the nose 2 (two) times daily. 05/28/17  Yes Bobbitt, Heywood Iles, MD  levocetirizine (XYZAL) 5 MG tablet Take 5 mg by mouth every evening.   Yes [provider]  Olopatadine HCl (PATADAY) 0.2 % SOLN Place 1 drop into both eyes 1 day or 1 dose. 05/28/17  Yes Bobbitt, Heywood Iles, MD  pantoprazole (PROTONIX) 40 MG tablet Take 40 mg by mouth daily.   Yes [provider]  Polyethyl Glycol-Propyl Glycol (SYSTANE OP) Apply 1 drop to eye 2 (two) times daily.   Yes [provider]  cromolyn (OPTICROM) 4 % ophthalmic solution Place 1 drop into both eyes 4 (four) times daily.    [provider]  fluticasone (FLONASE) 50 MCG/ACT nasal spray Place 2 sprays into both nostrils 2 (two) times daily.    [provider]  montelukast (SINGULAIR) 10 MG tablet Take 10 mg by mouth at bedtime.    [provider]    Allergies  Allergen Reactions  . Ciprofloxacin   . Sulfa Antibiotics     Patient Active Problem List   Diagnosis Date Noted  . Perennial allergic rhinitis 05/28/2017  . Allergic conjunctivitis 05/28/2017    Past Medical History:  Diagnosis Date  . 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 Diverticulitis   . Hiatal hernia     No past surgical history on file.  Social History   Socioeconomic History  . Marital status: Married    Spouse name: Not on file  . Number of children: Not on file  . Years of education: Not on file  . Highest education level: Not on file  Social Needs  . Financial resource strain: Not on file  . Food insecurity - worry: Not on file  . Food insecurity - inability: Not on file  . Transportation needs - medical: Not on file  . Transportation needs - non-medical: Not on file  Occupational History  . Not on file  Tobacco Use  . Smoking status: Current Every Day Smoker    Packs/day: 0.50    Types: Cigarettes  . Smokeless tobacco: Never Used  Substance and Sexual Activity  . Alcohol use: Yes  . Drug use: No  . Sexual activity: Not on file  Other Topics Concern  . Not on file  Social History Narrative  . Not on file    Family History  Problem Relation Age of Onset  . Asthma Mother   . Allergic rhinitis  Mother   . Asthma Brother   . Angioedema Neg Hx   . Eczema Neg Hx   . Immunodeficiency Neg Hx   . Urticaria Neg Hx      Review of Systems  Constitutional: Negative.  Negative for chills and fever.  HENT: Positive for sore throat. Negative for congestion, nosebleeds and sinus pain.   Eyes: Negative.  Negative for discharge and redness.  Respiratory: Negative.  Negative for cough, shortness of breath and wheezing.   Cardiovascular: Negative.  Negative for chest pain and palpitations.  Gastrointestinal: Negative.  Negative for abdominal pain, nausea and vomiting.  Genitourinary: Negative.  Negative for dysuria and hematuria.  Musculoskeletal: Negative.  Negative for back pain, myalgias and neck pain.  Skin: Negative.  Negative for rash.  Neurological:  Negative.  Negative for dizziness and headaches.  Endo/Heme/Allergies: Negative.   All other systems reviewed and are negative.   Vitals:   06/05/17 0826  BP: 132/80  Pulse: 97  Resp: 16  Temp: 99 F (37.2 C)  SpO2: 98%    Physical Exam  Constitutional: She is oriented to person, place, and time. She appears well-developed and well-nourished.  HENT:  Head: Normocephalic and atraumatic.  Right Ear: Hearing, tympanic membrane, external ear and ear canal normal.  Left Ear: Hearing, tympanic membrane, external ear and ear canal normal.  Nose: Nose normal.  Mouth/Throat: Oropharynx is clear and moist.  Eyes: Conjunctivae and EOM are normal. Pupils are equal, round, and reactive to light.  Neck: Normal range of motion. Neck supple. No JVD present. No thyromegaly present.  Cardiovascular: Normal rate, regular rhythm and normal heart sounds.  Pulmonary/Chest: Effort normal and breath sounds normal.  Musculoskeletal: Normal range of motion. She exhibits no edema.  Lymphadenopathy:    She has no cervical adenopathy.  Neurological: She is alert and oriented to person, place, and time. No sensory deficit. She exhibits normal muscle tone.  Skin: Skin is warm and dry. Capillary refill takes less than 2 seconds. No rash noted.  Psychiatric: She has a normal mood and affect. Her behavior is normal.  Vitals reviewed.    ASSESSMENT & PLAN:  [name] was seen today for establish care.  Diagnoses and all orders for this visit:  Throat pain  Viral illness  Encounter to establish care     Patient Instructions       IF you received an x-ray today, you will receive an invoice from [facility]. Please contact [facility] at [phone] with questions or concerns regarding your invoice.   IF you received labwork today, you will receive an invoice from [facility]. Please contact [facility] at [phone] with questions or concerns regarding your invoice.   Our  billing staff will not be able to assist you with questions regarding bills from these companies.  You will be contacted with the lab results as soon as they are available. The fastest way to get your results is to activate your My Chart account. Instructions are located on the last page of this paperwork. If you have not heard from us regarding the results in 2 weeks, please contact this office.      Viral Illness, Adult Viruses are tiny germs that can get into a person's body and cause illness. There are many different types of viruses, and they cause many types of illness. Viral illnesses can range from mild to severe. They can affect various parts of the body. Common illnesses that are caused by a virus include colds and the flu.  Viral illnesses also include serious conditions such as HIV/AIDS (human immunodeficiency virus/acquired immunodeficiency syndrome). A few viruses have been linked to certain cancers. What are the causes? Many types of viruses can cause illness. Viruses invade cells in your body, multiply, and cause the infected cells to malfunction or die. When the cell dies, it releases more of the virus. When this happens, you develop symptoms of the illness, and the virus continues to spread to other cells. If the virus takes over the function of the cell, it can cause the cell to divide and grow out of control, as is the case when a virus causes cancer. Different viruses get into the body in different ways. You can get a virus by:  Swallowing food or water that is contaminated with the virus.  Breathing in droplets that have been coughed or sneezed into the air by an infected person.  Touching a surface that has been contaminated with the virus and then touching your eyes, nose, or mouth.  Being bitten by an insect or animal that carries the virus.  Having sexual contact with a person who is infected with the virus.  Being exposed to blood or fluids that contain the virus,  either through an open cut or during a transfusion.  If a virus enters your body, your body's defense system (immune system) will try to fight the virus. You may be at higher risk for a viral illness if your immune system is weak. What are the signs or symptoms? Symptoms vary depending on the type of virus and the location of the cells that it invades. Common symptoms of the main types of viral illnesses include: Cold and flu viruses  Fever.  Headache.  Sore throat.  Muscle aches.  Nasal congestion.  Cough. Digestive system (gastrointestinal) viruses  Fever.  Abdominal pain.  Nausea.  Diarrhea. Liver viruses (hepatitis)  Loss of appetite.  Tiredness.  Yellowing of the skin (jaundice). Brain and spinal cord viruses  Fever.  Headache.  Stiff neck.  Nausea and vomiting.  Confusion or sleepiness. Skin viruses  Warts.  Itching.  Rash. Sexually transmitted viruses  Discharge.  Swelling.  Redness.  Rash. How is this treated? Viruses can be difficult to treat because they live within cells. Antibiotic medicines do not treat viruses because these drugs do not get inside cells. Treatment for a viral illness may include:  Resting and drinking plenty of fluids.  Medicines to relieve symptoms. These can include over-the-counter medicine for pain and fever, medicines for cough or congestion, and medicines to relieve diarrhea.  Antiviral medicines. These drugs are available only for certain types of viruses. They may help reduce flu symptoms if taken early. There are also many antiviral medicines for hepatitis and HIV/AIDS.  Some viral illnesses can be prevented with vaccinations. A common example is the flu shot. Follow these instructions at home: Medicines   Take over-the-counter and prescription medicines only as told by your health care provider.  If you were prescribed an antiviral medicine, take it as told by your health care provider. Do not stop  taking the medicine even if you start to feel better.  Be aware of when antibiotics are needed and when they are not needed. Antibiotics do not treat viruses. If your health care provider thinks that you may have a bacterial infection as well as a viral infection, you may get an antibiotic. ? Do not ask for an antibiotic prescription if you have been diagnosed with a viral illness. That will  not make your illness go away faster. ? Frequently taking antibiotics when they are not needed can lead to antibiotic resistance. When this develops, the medicine no longer works against the bacteria that it normally fights. General instructions  Drink enough fluids to keep your urine clear or pale yellow.  Rest as much as possible.  Return to your normal activities as told by your health care provider. Ask your health care provider what activities are safe for you.  Keep all follow-up visits as told by your health care provider. This is important. How is this prevented? Take these actions to reduce your risk of viral infection:  Eat a healthy diet and get enough rest.  Wash your hands often with soap and water. This is especially important when you are in public places. If soap and water are not available, use hand sanitizer.  Avoid close contact with friends and family who have a viral illness.  If you travel to areas where viral gastrointestinal infection is common, avoid drinking water or eating raw food.  Keep your immunizations up to date. Get a flu shot every year as told by your health care provider.  Do not share toothbrushes, nail clippers, razors, or needles with other people.  Always practice safe sex.  Contact a health care provider if:  You have symptoms of a viral illness that do not go away.  Your symptoms come back after going away.  Your symptoms get worse. Get help right away if:  You have trouble breathing.  You have a severe headache or a stiff neck.  You have  severe vomiting or abdominal pain. This information is not intended to replace advice given to you by your health care provider. Make sure you discuss any questions you have with your health care provider. Document Released: 07/20/2015 Document Revised: 08/22/2015 Document Reviewed: 07/20/2015 Elsevier Interactive Patient Education  2018 Elsevier Inc.  Sore Throat When you have a sore throat, your throat may:  Hurt.  Burn.  Feel irritated.  Feel scratchy.  Many things can cause a sore throat, including:  An infection.  Allergies.  Dryness in the air.  Smoke or pollution.  Gastroesophageal reflux disease (GERD).  A tumor.  A sore throat can be the first sign of another sickness. It can happen with other problems, like coughing or a fever. Most sore throats go away without treatment. Follow these instructions at home:  Take over-the-counter medicines only as told by your doctor.  Drink enough fluids to keep your pee (urine) clear or pale yellow.  Rest when you feel you need to.  To help with pain, try: ? Sipping warm liquids, such as broth, herbal tea, or warm water. ? Eating or drinking cold or frozen liquids, such as frozen ice pops. ? Gargling with a salt-water mixture 3-4 times a day or as needed. To make a salt-water mixture, add -1 tsp of salt in 1 cup of warm water. Mix it until you cannot see the salt anymore. ? Sucking on hard candy or throat lozenges. ? Putting a cool-mist humidifier in your bedroom at night. ? Sitting in the bathroom with the door closed for 5-10 minutes while you run hot water in the shower.  Do not use any tobacco products, such as cigarettes, chewing tobacco, and e-cigarettes. If you need help quitting, ask your doctor. Contact a doctor if:  You have a fever for more than 2-3 days.  You keep having symptoms for more than 2-3 days.  Your throat  does not get better in 7 days.  You have a fever and your symptoms suddenly get  worse. Get help right away if:  You have trouble breathing.  You cannot swallow fluids, soft foods, or your saliva.  You have swelling in your throat or neck that gets worse.  You keep feeling like you are going to throw up (vomit).  You keep throwing up. This information is not intended to replace advice given to you by your health care provider. Make sure you discuss any questions you have with your health care provider. Document Released: 12/18/2007 Document Revised: 11/04/2015 Document Reviewed: 12/29/2014 Elsevier Interactive Patient Education  2018 Elsevier Inc.      Edwina Barth, MD [facility]

## 2017-06-17 ENCOUNTER — Ambulatory Visit: Payer: 59 | Admitting: Allergy and Immunology

## 2017-06-17 ENCOUNTER — Encounter: Payer: Self-pay | Admitting: Allergy and Immunology

## 2017-06-17 VITALS — BP 122/82 | HR 62 | Temp 98.4°F | Resp 16

## 2017-06-17 DIAGNOSIS — J3089 Other allergic rhinitis: Secondary | ICD-10-CM

## 2017-06-17 DIAGNOSIS — H1013 Acute atopic conjunctivitis, bilateral: Secondary | ICD-10-CM | POA: Diagnosis not present

## 2017-06-17 DIAGNOSIS — Z91038 Other insect allergy status: Secondary | ICD-10-CM

## 2017-06-17 DIAGNOSIS — Z91018 Allergy to other foods: Secondary | ICD-10-CM | POA: Diagnosis not present

## 2017-06-17 MED ORDER — MOMETASONE FUROATE 50 MCG/ACT NA SUSP: 1.0000 | Freq: Two times a day (BID) | NASAL | 5 refills | Status: AC | PRN

## 2017-06-17 MED ORDER — CARBINOXAMINE MALEATE 6 MG PO TABS: 6.0000 mg | ORAL_TABLET | Freq: Three times a day (TID) | ORAL | 5 refills | Status: AC | PRN

## 2017-06-17 MED ORDER — OLOPATADINE HCL 0.7 % OP SOLN: 1.0000 [drp] | OPHTHALMIC | 5 refills | Status: AC

## 2017-06-17 NOTE — Assessment & Plan Note (Signed)
The patient's history suggests possible food allergy.  Food allergen skin tests were negative today despite a positive histamine control.  However, there is still a 5% chance that the allergy exists.  A laboratory order form has been provided for serum specific IgE against shellfish panel, tree nut panel, cow's milk, and cows milk component.  Carefully avoid these foods and have access to epinephrine autoinjectors until food allergy has been definitively ruled out.Marland Kitchen

## 2017-06-17 NOTE — Assessment & Plan Note (Signed)
The patient's history suggests hymenoptera venom hypersensitivity.  A laboratory order form has been provided for baseline tryptase level and serum specific IgE to hymenoptera panel.  For now, attempt to avoid flying insects and have access to epinephrine autoinjectors.

## 2017-06-17 NOTE — Assessment & Plan Note (Signed)
   Treatment plan as outlined above for allergic rhinitis.  A prescription has been provided for Pazeo, one drop per eye daily as needed.  I have also recommended eye lubricant drops (i.e., Natural Tears) as needed. 

## 2017-06-17 NOTE — Progress Notes (Signed)
ryvent

## 2017-06-17 NOTE — Assessment & Plan Note (Signed)
   Aeroallergen avoidance measures have been discussed and provided in written form.  A prescription has been provided for RyVent (carbinoxamine maleate) 6mg  every 6-8 hours as needed.  A prescription has been provided for Nasonex nasal spray, one spray per nostril 1-2 times daily as needed. Proper nasal spray technique has been discussed and demonstrated.  Nasal saline lavage (NeilMed) has been recommended as needed and prior to medicated nasal sprays along with instructions for proper administration.  If allergen avoidance measures and medications fail to adequately relieve symptoms, aeroallergen immunotherapy will be considered.

## 2017-06-17 NOTE — Patient Instructions (Addendum)
Perennial and seasonal allergic rhinitis  Aeroallergen avoidance measures have been discussed and provided in written form.  A prescription has been provided for RyVent (carbinoxamine maleate) 6mg  every 6-8 hours as needed.  A prescription has been provided for Nasonex nasal spray, one spray per nostril 1-2 times daily as needed. Proper nasal spray technique has been discussed and demonstrated.  Nasal saline lavage (NeilMed) has been recommended as needed and prior to medicated nasal sprays along with instructions for proper administration.  If allergen avoidance measures and medications fail to adequately relieve symptoms, aeroallergen immunotherapy will be considered.  Allergic conjunctivitis  Treatment plan as outlined above for allergic rhinitis.  A prescription has been provided for Pazeo, one drop per eye daily as needed.  I have also recommended eye lubricant drops (i.e., Natural Tears) as needed.  History of food allergy The patient's history suggests possible food allergy.  Food allergen skin tests were negative today despite a positive histamine control.  However, there is still a 5% chance that the allergy exists.  A laboratory order form has been provided for serum specific IgE against shellfish panel, tree nut panel, cow's milk, and cows milk component.  Carefully avoid these foods and have access to epinephrine autoinjectors until food allergy has been definitively ruled out.Marland Kitchen.  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  A laboratory order form has been provided for baseline tryptase level and serum specific IgE to hymenoptera panel.  For now, attempt to avoid flying insects and have access to epinephrine autoinjectors.   When lab results have returned the patient will be called with further recommendations and follow up instructions.  Control of Mold Allergen  Mold and fungi can grow on a variety of  surfaces provided certain temperature and moisture conditions exist.  Outdoor molds grow on plants, decaying vegetation and soil.  The major outdoor mold, Alternaria and Cladosporium, are found in very high numbers during hot and dry conditions.  Generally, a late Summer - Fall peak is seen for common outdoor fungal spores.  Rain will temporarily lower outdoor mold spore count, but counts rise rapidly when the rainy period ends.  The most important indoor molds are Aspergillus and Penicillium.  Dark, humid and poorly ventilated basements are ideal sites for mold growth.  The next most common sites of mold growth are the bathroom and the kitchen.  Outdoor MicrosoftMold Control 1. Use air conditioning and keep windows closed 2. Avoid exposure to decaying vegetation. 3. Avoid leaf raking. 4. Avoid grain handling. 5. Consider wearing a face mask if working in moldy areas.  Indoor Mold Control 1. Maintain humidity below 50%. 2. Clean washable surfaces with 5% bleach solution. 3. Remove sources e.g. Contaminated carpets.  Control of Dog or Cat Allergen  Avoidance is the best way to manage a dog or cat allergy. If you have a dog or cat and are allergic to dog or cats, consider removing the dog or cat from the home. If you have a dog or cat but don't want to find it a new home, or if your family wants a pet even though someone in the household is allergic, here are some strategies that may help keep symptoms at bay:  1. Keep the pet out of your bedroom and restrict it to only a few rooms. Be advised that keeping the dog or cat in only one room will not limit the allergens to that room. 2. Don't pet, hug or kiss the dog  or cat; if you do, wash your hands with soap and water. 3. High-efficiency particulate air (HEPA) cleaners run continuously in a bedroom or living room can reduce allergen levels over time. 4. Place electrostatic material sheet in the air inlet vent in the bedroom. 5. Regular use of a  high-efficiency vacuum cleaner or a central vacuum can reduce allergen levels. 6. Giving your dog or cat a bath at least once a week can reduce airborne allergen.  Reducing Pollen Exposure  The American Academy of Allergy, Asthma and Immunology suggests the following steps to reduce your exposure to pollen during allergy seasons.    1. Do not hang sheets or clothing out to dry; pollen may collect on these items. 2. Do not mow lawns or spend time around freshly cut grass; mowing stirs up pollen. 3. Keep windows closed at night.  Keep car windows closed while driving. 4. Minimize morning activities outdoors, a time when pollen counts are usually at their highest. 5. Stay indoors as much as possible when pollen counts or humidity is high and on windy days when pollen tends to remain in the air longer. 6. Use air conditioning when possible.  Many air conditioners have filters that trap the pollen spores. 7. Use a HEPA room air filter to remove pollen form the indoor air you breathe.

## 2017-06-17 NOTE — Progress Notes (Signed)
Follow-up Note  RE: Vanessa Shaw MRN: [id number] DOB: [birth year] Date of Office Visit: 06/17/2017  Primary care provider: System, Pcp Not In Referring provider: No ref. provider found  History of present illness: Vanessa Shaw is a 52 y.o. female with allergic rhinoconjunctivitis presenting today for allergy skin testing.  He was previously seen in this clinic for her initial evaluation on May 28, 2016.  There was some confusion regarding the content of the vials provided by her previous allergist, therefore we will skin test today to ensure accurate and comprehensive vial content.  She has been off of antihistamines for the past 3 days in anticipation of today's testing.  She also reports some pharyngeal pruritus with consumption of certain tree nuts, sharp chatter, and shellfish.  She reports that she is able to consume other types of cheese and yogurt without symptoms.  She currently carries an epinephrine auto-injector.  She reports that when she was 52 years old she was stung by 3 wasps.  She had a systemic reaction resulting in syncope.  She was treated in the emergency department with epinephrine.  She currently does not have an epinephrine autoinjector but does her best to avoid wasps.  Assessment and plan: Perennial and seasonal allergic rhinitis  Aeroallergen avoidance measures have been discussed and provided in written form.  A prescription has been provided for RyVent (carbinoxamine maleate) 6mg  every 6-8 hours as needed.  A prescription has been provided for Nasonex nasal spray, one spray per nostril 1-2 times daily as needed. Proper nasal spray technique has been discussed and demonstrated.  Nasal saline lavage (NeilMed) has been recommended as needed and prior to medicated nasal sprays along with instructions for proper administration.  If allergen avoidance measures and medications fail to adequately relieve symptoms, aeroallergen immunotherapy will be  considered.  Allergic conjunctivitis  Treatment plan as outlined above for allergic rhinitis.  A prescription has been provided for Pazeo, one drop per eye daily as needed.  I have also recommended eye lubricant drops (i.e., Natural Tears) as needed.  History of food allergy The patient's history suggests possible food allergy.  Food allergen skin tests were negative today despite a positive histamine control.  However, there is still a 5% chance that the allergy exists.  A laboratory order form has been provided for serum specific IgE against shellfish panel, tree nut panel, cow's milk, and cows milk component.  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  A laboratory order form has been provided for baseline tryptase level and serum specific IgE to hymenoptera panel.  For now, attempt to avoid flying insects and have access to epinephrine autoinjectors.   Meds ordered this encounter  Medications  . Carbinoxamine Maleate (RYVENT) 6 MG TABS    Sig: Take 6 mg by mouth every 8 (eight) hours as needed.    Dispense:  30 tablet    Refill:  5    RUN AS CASH PAY ONLY! BIN [id number] RXPCN [id number] [id number]  . mometasone (NASONEX) 50 MCG/ACT nasal spray    Sig: Place 1 spray into the nose 2 (two) times daily as needed.    Dispense:  17 g    Refill:  5  . Olopatadine HCl (PAZEO) 0.7 % SOLN    Sig: Place 1 drop into both eyes 1 day or 1 dose.    Dispense:  1 Bottle    Refill:  5    Diagnostics: Environmental skin testing: Positive to grass pollen, ragweed pollen, weed pollen, molds, and cat hair. Food allergen testing: Negative despite a positive histamine control.    Physical examination: Blood pressure 122/82, pulse 62, temperature 98.4 F (36.9 C), temperature source Oral,  resp. rate 16, SpO2 98 %.  General: Alert, interactive, in no acute distress. HEENT: TMs pearly gray, turbinates edematous with clear discharge, post-pharynx moderately erythematous. Neck: Supple without lymphadenopathy. Lungs: Clear to auscultation without wheezing, rhonchi or rales. CV: Normal S1, S2 without murmurs. Skin: Warm and dry, without lesions or rashes.  The following portions of the patient's history were reviewed and updated as appropriate: allergies, current medications, past family history, past medical history, past social history, past surgical history and problem list.  Allergies as of 06/17/2017      Reactions   Ciprofloxacin    Sulfa Antibiotics       Medication List        Accurate as of 06/17/17 10:36 AM. Always use your most recent med list.          atorvastatin 40 MG tablet Commonly known as:  LIPITOR Take 40 mg by mouth daily.   azelastine 0.1 % nasal spray Commonly known as:  ASTELIN Place 2 sprays into both nostrils 2 (two) times daily. Use in each nostril as directed   Carbinoxamine Maleate 6 MG Tabs Commonly known as:  RYVENT Take 6 mg by mouth 2 (two) times daily.   Carbinoxamine Maleate 6 MG Tabs Commonly known as:  RYVENT Take 6 mg by mouth every 8 (eight) hours as needed.   cromolyn 4 % ophthalmic solution Commonly known as:  OPTICROM Place 1 drop into both eyes 4 (four) times daily.   Fluticasone Propionate 93 MCG/ACT Exhu Commonly known as:  XHANCE Place 2 puffs into the nose 2 (two) times daily.   mometasone 50 MCG/ACT nasal spray Commonly known as:  NASONEX Place 1 spray into the nose 2 (two) times daily as needed.   montelukast 10 MG tablet Commonly known as:  SINGULAIR Take 10 mg by mouth at bedtime.   Olopatadine HCl 0.2 % Soln Commonly known as:  PATADAY Place 1 drop into both eyes 1 day or 1 dose.   Olopatadine HCl 0.7 % Soln Commonly known as:  PAZEO Place 1 drop into both eyes 1 day or 1 dose.   pantoprazole  40 MG tablet Commonly known as:  PROTONIX Take 40 mg by mouth daily.   SYSTANE OP Apply 1 drop to eye 2 (two) times daily.       Allergies  Allergen Reactions  . Ciprofloxacin   . Sulfa Antibiotics    Review of systems: Review of systems negative except as noted in HPI / PMHx or noted below: Constitutional: Negative.  HENT: Negative.   Eyes: Negative.  Respiratory: Negative.   Cardiovascular: Negative.  Gastrointestinal: Negative.  Genitourinary: Negative.  Musculoskeletal: Negative.  Neurological: Negative.  Endo/Heme/Allergies: Negative.  Cutaneous: Negative.  Past Medical History:  Diagnosis Date  . Allergic rhinitis   . 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 Diverticulitis   . Hiatal hernia     Family History  Problem Relation Age of Onset  . Asthma Mother   . Allergic rhinitis Mother   . Asthma Brother   . Angioedema Neg Hx   . Eczema Neg Hx   . Immunodeficiency Neg Hx   . Urticaria Neg Hx  Social History   Socioeconomic History  . Marital status: Married    Spouse name: Not on file  . Number of children: Not on file  . Years of education: Not on file  . Highest education level: Not on file  Occupational History  . Not on file  Social Needs  . Financial resource strain: Not on file  . Food insecurity:    Worry: Not on file    Inability: Not on file  . Transportation needs:    Medical: Not on file    Non-medical: Not on file  Tobacco Use  . Smoking status: Current Every Day Smoker    Packs/day: 0.50    Years: 20.00    Pack years: 10.00    Types: Cigarettes  . Smokeless tobacco: Never Used  Substance and Sexual Activity  . Alcohol use: Yes  . Drug use: No  . Sexual activity: Not on file  Lifestyle  . Physical activity:    Days per week: Not on file    Minutes per session: Not on file  . Stress: Not on file  Relationships  . Social connections:    Talks on phone: Not on file    Gets together: Not on file    Attends religious service: Not  on file    Active member of club or organization: Not on file    Attends meetings of clubs or organizations: Not on file    Relationship status: Not on file  . Intimate partner violence:    Fear of current or ex partner: Not on file    Emotionally abused: Not on file    Physically abused: Not on file    Forced sexual activity: Not on file  Other Topics Concern  . Not on file  Social History Narrative  . Not on file    I appreciate the opportunity to take part in Vanessa Shaw's care. Please do not hesitate to contact me with questions.  Sincerely,   R. Jorene Guestarter Lucca Greggs, MD

## 2017-06-22 LAB — ALLERGEN HYMENOPTERA PANEL
Bumblebee: <0.1 kU/L
Honeybee IgE: <0.1 kU/L
Hornet, White Face, IgE: <0.1 kU/L
Hornet, Yellow, IgE: <0.1 kU/L
Paper Wasp IgE: <0.1 kU/L
Yellow Jacket, IgE: <0.1 kU/L

## 2017-06-22 LAB — ALLERGY PANEL 18, NUT MIX GROUP
Allergen Coconut IgE: <0.1 kU/L
F020-IgE Almond: <0.1 kU/L
F202-IgE Cashew Nut: <0.1 kU/L
Hazelnut (Filbert) IgE: <0.1 kU/L
Peanut IgE: <0.1 kU/L
Pecan Nut IgE: <0.1 kU/L
Sesame Seed IgE: <0.1 kU/L

## 2017-06-22 LAB — ALLERGEN PROFILE, SHELLFISH
Clam IgE: <0.1 kU/L
F023-IgE Crab: <0.1 kU/L
F080-IgE Lobster: <0.1 kU/L
F290-IgE Oyster: <0.1 kU/L
Scallop IgE: <0.1 kU/L
Shrimp IgE: <0.1 kU/L

## 2017-06-22 LAB — MILK COMPONENT PANEL
F076-IgE Alpha Lactalbumin: <0.1 kU/L
F077-IgE Beta Lactoglobulin: <0.1 kU/L
F078-IgE Casein: <0.1 kU/L

## 2017-06-22 LAB — ALLERGEN, BRAZIL NUT, F18: Brazil Nut IgE: <0.1 kU/L

## 2017-06-22 LAB — TRYPTASE: Tryptase: 5.7 ug/L (ref 2.2–13.2)

## 2017-06-22 LAB — ALLERGEN MILK: Milk IgE: <0.1 kU/L

## 2017-06-22 LAB — ALLERGEN WALNUT F256: Walnut IgE: <0.1 kU/L

## 2017-07-09 ENCOUNTER — Encounter: Payer: Self-pay | Admitting: Emergency Medicine

## 2017-07-09 ENCOUNTER — Ambulatory Visit (INDEPENDENT_AMBULATORY_CARE_PROVIDER_SITE_OTHER): Payer: 59 | Admitting: Emergency Medicine

## 2017-07-09 ENCOUNTER — Other Ambulatory Visit: Payer: Self-pay

## 2017-07-09 VITALS — BP 139/85 | HR 88 | Temp 98.7°F | Resp 16 | Ht 61.5 in | Wt 131.4 lb

## 2017-07-09 DIAGNOSIS — Z0000 Encounter for general adult medical examination without abnormal findings: Secondary | ICD-10-CM

## 2017-07-09 NOTE — Progress Notes (Signed)
Vanessa Shaw 52 y.o.   Chief Complaint  Patient presents with  . Annual Exam    patient is FASTING    HISTORY OF PRESENT ILLNESS: This is a 52 y.o. female Here for annual exam; no complaints and no medical concerns.   HPI   Prior to Admission medications   Medication Sig Start Date End Date Taking? Authorizing Provider  atorvastatin (LIPITOR) 40 MG tablet Take 40 mg by mouth daily.   Yes [provider]  azelastine (ASTELIN) 0.1 % nasal spray Place 2 sprays into both nostrils 2 (two) times daily. Use in each nostril as directed   Yes [provider]  Vanessa Shaw Vanessa Shaw (RYVENT) 6 MG TABS Take 6 mg by mouth 2 (two) times daily. 05/28/17  Yes Vanessa Shaw, Vanessa Muta, MD  mometasone (NASONEX) 50 MCG/ACT nasal spray Place 1 spray into the nose 2 (two) times daily as needed. 06/17/17  Yes Vanessa Shaw, Vanessa Muta, MD  montelukast (SINGULAIR) 10 MG tablet Take 10 mg by mouth at bedtime.   Yes [provider]  Olopatadine HCl (PAZEO) 0.7 % SOLN Place 1 drop into both eyes 1 day or 1 dose. 06/17/17  Yes Vanessa Shaw, Vanessa Muta, MD  pantoprazole (PROTONIX) 40 MG tablet Take 40 mg by mouth daily.   Yes [provider]  Polyethyl Glycol-Propyl Glycol (SYSTANE OP) Apply 1 drop to eye 2 (two) times daily.   Yes [provider]  Vanessa Shaw Vanessa Shaw (RYVENT) 6 MG TABS Take 6 mg by mouth every 8 (eight) hours as needed. 06/17/17   Vanessa Shaw, Vanessa Muta, MD  cromolyn (OPTICROM) 4 % ophthalmic solution Place 1 drop into both eyes 4 (four) times daily.    [provider]  Fluticasone Propionate (XHANCE) 93 MCG/ACT EXHU Place 2 puffs into the nose 2 (two) times daily. Patient not taking: Reported on 07/09/2017 05/28/17   Vanessa Shaw, Vanessa Muta, MD  Olopatadine HCl (PATADAY) 0.2 % SOLN Place 1 drop into both eyes 1 day or 1 dose. Patient not taking: Reported on 07/09/2017 05/28/17   Vanessa Shaw, Vanessa Muta, MD    Allergies  Allergen Reactions  . Ciprofloxacin    . Sulfa Antibiotics     Patient Active Problem List   Diagnosis Date Noted  . History of food allergy 06/17/2017  . History of allergy to insect stings 06/17/2017  . Perennial and seasonal allergic rhinitis 05/28/2017  . Allergic conjunctivitis 05/28/2017    Past Medical History:  Diagnosis Date  . Allergic rhinitis   . 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 Diverticulitis   . Hiatal hernia     No past surgical history on file.  Social History   Socioeconomic History  . Marital status: Married    Spouse name: Not on file  . Number of children: Not on file  . Years of education: Not on file  . Highest education level: Not on file  Occupational History  . Not on file  Social Needs  . Financial resource strain: Not on file  . Food insecurity:    Worry: Not on file    Inability: Not on file  . Transportation needs:    Medical: Not on file    Non-medical: Not on file  Tobacco Use  . Smoking status: Current Every Day Smoker    Packs/day: 0.50    Years: 20.00    Pack years: 10.00    Types: Cigarettes  . Smokeless tobacco: Never Used  Substance and Sexual Activity  . Alcohol use: Yes  . Drug  use: No  . Sexual activity: Not on file  Lifestyle  . Physical activity:    Days per week: Not on file    Minutes per session: Not on file  . Stress: Not on file  Relationships  . Social connections:    Talks on phone: Not on file    Gets together: Not on file    Attends religious service: Not on file    Active member of club or organization: Not on file    Attends meetings of clubs or organizations: Not on file    Relationship status: Not on file  . Intimate partner violence:    Fear of current or ex partner: Not on file    Emotionally abused: Not on file    Physically abused: Not on file    Forced sexual activity: Not on file  Other Topics Concern  . Not on file  Social History Narrative  . Not on file    Family History  Problem Relation Age of Onset  . Asthma Mother    . Allergic rhinitis Mother   . Asthma Brother   . Angioedema Neg Hx   . Eczema Neg Hx   . Immunodeficiency Neg Hx   . Urticaria Neg Hx      Review of Systems  Constitutional: Negative.  Negative for chills and fever.  HENT: Negative.  Negative for congestion, nosebleeds and sore throat.   Eyes: Negative.   Respiratory: Negative.  Negative for cough.   Cardiovascular: Negative.  Negative for chest pain and palpitations.  Gastrointestinal: Negative.  Negative for abdominal pain, diarrhea, heartburn, nausea and vomiting.  Genitourinary: Negative.  Negative for dysuria and hematuria.  Musculoskeletal: Positive for back pain (right lumbar area).  Skin: Negative.  Negative for rash.  Neurological: Negative.  Negative for dizziness and headaches.  Endo/Heme/Allergies:       Chronic allergies  All other systems reviewed and are negative.  Vitals:   07/09/17 1539  BP: 139/85  Pulse: 88  Resp: 16  Temp: 98.7 F (37.1 C)  SpO2: 95%     Physical Exam  Constitutional: She appears well-developed and well-nourished.  HENT:  Head: Normocephalic and atraumatic.  Nose: Nose normal.  Mouth/Throat: Oropharynx is clear and moist.  Eyes: Pupils are equal, round, and reactive to light. Conjunctivae are normal.  Neck: Normal range of motion. No thyromegaly present.  Cardiovascular: Normal rate, regular rhythm and normal heart sounds.  Pulmonary/Chest: Effort normal and breath sounds normal.  Abdominal: Soft. Bowel sounds are normal. Hernia confirmed negative in the right inguinal area and confirmed negative in the left inguinal area.  Genitourinary: Vagina normal. Pelvic exam was performed with patient supine. There is no rash, tenderness or lesion on the right labia. There is no rash, tenderness or lesion on the left labia. Cervix exhibits no motion tenderness, no discharge and no friability. No erythema, tenderness or bleeding in the vagina. No vaginal discharge found.  Lymphadenopathy:     She has no cervical adenopathy. No inguinal adenopathy noted on the right or left side.  Vitals reviewed.    ASSESSMENT & PLAN: Vanessa Shaw was seen today for annual exam.  Diagnoses and all orders for this visit:  Routine general medical examination at a health care facility -     CBC with Differential -     Comprehensive metabolic panel -     Hemoglobin A1c -     TSH -     HIV antibody -     Pap  IG and Chlamydia/Gonococcus, NAA ([facility]) -     Cancel: Pap IG, CT/NG w/ reflex HPV when ASC-U   Patient Instructions       IF you received an x-ray today, you will receive an invoice from [facility]. Please contact [facility] at [phone] with questions or concerns regarding your invoice.   IF you received labwork today, you will receive an invoice from [facility]. Please contact [facility] at [phone] with questions or concerns regarding your invoice.   Our billing staff will not be able to assist you with questions regarding bills from these companies.  You will be contacted with the lab results as soon as they are available. The fastest way to get your results is to activate your My Chart account. Instructions are located on the last page of this paperwork. If you have not heard from [place] regarding the results in 2 weeks, please contact this office.     Health Maintenance, Female Adopting a healthy lifestyle and getting preventive care can go a long way to promote health and wellness. Talk with your health care provider about what schedule of regular examinations is right for you. This is a good chance for you to check in with your provider about disease prevention and staying healthy. In between checkups, there are plenty of things you can do on your own. Experts have done a lot of research about which lifestyle changes and preventive measures are most likely to keep you healthy. Ask your health care provider for more information. Weight and diet Eat  a healthy diet  Be sure to include plenty of vegetables, fruits, low-fat dairy products, and lean protein.  Do not eat a lot of foods high in solid fats, added sugars, or salt.  Get regular exercise. This is one of the most important things you can do for your health. ? Most adults should exercise for at least 150 minutes each week. The exercise should increase your heart rate and make you sweat (moderate-intensity exercise). ? Most adults should also do strengthening exercises at least twice a week. This is in addition to the moderate-intensity exercise.  Maintain a healthy weight  Body mass index (BMI) is a measurement that can be used to identify possible weight problems. It estimates body fat based on height and weight. Your health care provider can help determine your BMI and help you achieve or maintain a healthy weight.  For females 21 years of age and older: ? A BMI below 18.5 is considered underweight. ? A BMI of 18.5 to 24.9 is normal. ? A BMI of 25 to 29.9 is considered overweight. ? A BMI of 30 and above is considered obese.  Watch levels of cholesterol and blood lipids  You should start having your blood tested for lipids and cholesterol at 51 years of age, then have this test every 5 years.  You may need to have your cholesterol levels checked more often if: ? Your lipid or cholesterol levels are high. ? You are older than 51 years of age. ? You are at high risk for heart disease.  Cancer screening Lung Cancer  Lung cancer screening is recommended for adults 46-24 years old who are at high risk for lung cancer because of a history of smoking.  A yearly low-dose CT scan of the lungs is recommended for people who: ? Currently smoke. ? Have quit within the past 15 years. ? Have at least a 30-pack-year history of smoking. A pack year is smoking  an average of one pack of cigarettes a day for 1 year.  Yearly screening should continue until it has been 15 years since you  quit.  Yearly screening should stop if you develop a health problem that would prevent you from having lung cancer treatment.  Breast Cancer  Practice breast self-awareness. This means understanding how your breasts normally appear and feel.  It also means doing regular breast self-exams. Let your health care provider know about any changes, no matter how small.  If you are in your 20s or 30s, you should have a clinical breast exam (CBE) by a health care provider every 1-3 years as part of a regular health exam.  If you are 72 or older, have a CBE every year. Also consider having a breast X-ray (mammogram) every year.  If you have a family history of breast cancer, talk to your health care provider about genetic screening.  If you are at high risk for breast cancer, talk to your health care provider about having an MRI and a mammogram every year.  Breast cancer gene (BRCA) assessment is recommended for women who have family members with BRCA-related cancers. BRCA-related cancers include: ? Breast. ? Ovarian. ? Tubal. ? Peritoneal cancers.  Results of the assessment will determine the need for genetic counseling and BRCA1 and BRCA2 testing.  Cervical Cancer Your health care provider may recommend that you be screened regularly for cancer of the pelvic organs (ovaries, uterus, and vagina). This screening involves a pelvic examination, including checking for microscopic changes to the surface of your cervix (Pap test). You may be encouraged to have this screening done every 3 years, beginning at age 5.  For women ages 59-65, health care providers may recommend pelvic exams and Pap testing every 3 years, or they may recommend the Pap and pelvic exam, combined with testing for human papilloma virus (HPV), every 5 years. Some types of HPV increase your risk of cervical cancer. Testing for HPV may also be done on women of any age with unclear Pap test results.  Other health care providers  may not recommend any screening for nonpregnant women who are considered low risk for pelvic cancer and who do not have symptoms. Ask your health care provider if a screening pelvic exam is right for you.  If you have had past treatment for cervical cancer or a condition that could lead to cancer, you need Pap tests and screening for cancer for at least 20 years after your treatment. If Pap tests have been discontinued, your risk factors (such as having a new sexual partner) need to be reassessed to determine if screening should resume. Some women have medical problems that increase the chance of getting cervical cancer. In these cases, your health care provider may recommend more frequent screening and Pap tests.  Colorectal Cancer  This type of cancer can be detected and often prevented.  Routine colorectal cancer screening usually begins at 51 years of age and continues through 51 years of age.  Your health care provider may recommend screening at an earlier age if you have risk factors for colon cancer.  Your health care provider may also recommend using home test kits to check for hidden blood in the stool.  A small camera at the end of a tube can be used to examine your colon directly (sigmoidoscopy or colonoscopy). This is done to check for the earliest forms of colorectal cancer.  Routine screening usually begins at age 5.  Direct examination of  the colon should be repeated every 5-10 years through 51 years of age. However, you may need to be screened more often if early forms of precancerous polyps or small growths are found.  Skin Cancer  Check your skin from head to toe regularly.  Tell your health care provider about any new moles or changes in moles, especially if there is a change in a mole's shape or color.  Also tell your health care provider if you have a mole that is larger than the size of a pencil eraser.  Always use sunscreen. Apply sunscreen liberally and repeatedly  throughout the day.  Protect yourself by wearing long sleeves, pants, a wide-brimmed hat, and sunglasses whenever you are outside.  Heart disease, diabetes, and high blood pressure  High blood pressure causes heart disease and increases the risk of stroke. High blood pressure is more likely to develop in: ? People who have blood pressure in the high end of the normal range (130-139/85-89 mm Hg). ? People who are overweight or obese. ? People who are African American.  If you are 72-89 years of age, have your blood pressure checked every 3-5 years. If you are 102 years of age or older, have your blood pressure checked every year. You should have your blood pressure measured twice-once when you are at a hospital or clinic, and once when you are not at a hospital or clinic. Record the average of the two measurements. To check your blood pressure when you are not at a hospital or clinic, you can use: ? An automated blood pressure machine at a pharmacy. ? A home blood pressure monitor.  If you are between 17 years and 27 years old, ask your health care provider if you should take aspirin to prevent strokes.  Have regular diabetes screenings. This involves taking a blood sample to check your fasting blood sugar level. ? If you are at a normal weight and have a low risk for diabetes, have this test once every three years after 51 years of age. ? If you are overweight and have a high risk for diabetes, consider being tested at a younger age or more often. Preventing infection Hepatitis B  If you have a higher risk for hepatitis B, you should be screened for this virus. You are considered at high risk for hepatitis B if: ? You were born in a country where hepatitis B is common. Ask your health care provider which countries are considered high risk. ? Your parents were born in a high-risk country, and you have not been immunized against hepatitis B (hepatitis B vaccine). ? You have HIV or AIDS. ? You  use needles to inject street drugs. ? You live with someone who has hepatitis B. ? You have had sex with someone who has hepatitis B. ? You get hemodialysis treatment. ? You take certain medicines for conditions, including cancer, organ transplantation, and autoimmune conditions.  Hepatitis C  Blood testing is recommended for: ? Everyone born from [birth year] through 1964. ? Anyone with known risk factors for hepatitis C.  Sexually transmitted infections (STIs)  You should be screened for sexually transmitted infections (STIs) including gonorrhea and chlamydia if: ? You are sexually active and are younger than 51 years of age. ? You are older than 51 years of age and your health care provider tells you that you are at risk for this type of infection. ? Your sexual activity has changed since you were last screened and you are at  an increased risk for chlamydia or gonorrhea. Ask your health care provider if you are at risk.  If you do not have HIV, but are at risk, it may be recommended that you take a prescription medicine daily to prevent HIV infection. This is called pre-exposure prophylaxis (PrEP). You are considered at risk if: ? You are sexually active and do not regularly use condoms or know the HIV status of your partner(s). ? You take drugs by injection. ? You are sexually active with a partner who has HIV.  Talk with your health care provider about whether you are at high risk of being infected with HIV. If you choose to begin PrEP, you should first be tested for HIV. You should then be tested every 3 months for as long as you are taking PrEP. Pregnancy  If you are premenopausal and you may become pregnant, ask your health care provider about preconception counseling.  If you may become pregnant, take 400 to 800 micrograms (mcg) of folic acid every day.  If you want to prevent pregnancy, talk to your health care provider about birth control (contraception). Osteoporosis and  menopause  Osteoporosis is a disease in which the bones lose minerals and strength with aging. This can result in serious bone fractures. Your risk for osteoporosis can be identified using a bone density scan.  If you are 60 years of age or older, or if you are at risk for osteoporosis and fractures, ask your health care provider if you should be screened.  Ask your health care provider whether you should take a calcium or vitamin D supplement to lower your risk for osteoporosis.  Menopause may have certain physical symptoms and risks.  Hormone replacement therapy may reduce some of these symptoms and risks. Talk to your health care provider about whether hormone replacement therapy is right for you. Follow these instructions at home:  Schedule regular health, dental, and eye exams.  Stay current with your immunizations.  Do not use any tobacco products including cigarettes, chewing tobacco, or electronic cigarettes.  If you are pregnant, do not drink alcohol.  If you are breastfeeding, limit how much and how often you drink alcohol.  Limit alcohol intake to no more than 1 drink per day for nonpregnant women. One drink equals 12 ounces of beer, 5 ounces of wine, or 1 ounces of hard liquor.  Do not use street drugs.  Do not share needles.  Ask your health care provider for help if you need support or information about quitting drugs.  Tell your health care provider if you often feel depressed.  Tell your health care provider if you have ever been abused or do not feel safe at home. This information is not intended to replace advice given to you by your health care provider. Make sure you discuss any questions you have with your health care provider. Document Released: 09/23/2010 Document Revised: 08/16/2015 Document Reviewed: 12/12/2014 [employer] Interactive Patient Education  2018 [employer]      Agustina Caroli, MD [facility]

## 2017-07-09 NOTE — Patient Instructions (Addendum)
   IF you received an x-ray today, you will receive an invoice from [facility]. Please contact [facility] at [phone] with questions or concerns regarding your invoice.   IF you received labwork today, you will receive an invoice from [facility]. Please contact [facility] at [phone] with questions or concerns regarding your invoice.   Our billing staff will not be able to assist you with questions regarding bills from these companies.  You will be contacted with the lab results as soon as they are available. The fastest way to get your results is to activate your My Chart account. Instructions are located on the last page of this paperwork. If you have not heard from us regarding the results in 2 weeks, please contact this office.     Health Maintenance, Female Adopting a healthy lifestyle and getting preventive care can go a long way to promote health and wellness. Talk with your health care provider about what schedule of regular examinations is right for you. This is a good chance for you to check in with your provider about disease prevention and staying healthy. In between checkups, there are plenty of things you can do on your own. Experts have done a lot of research about which lifestyle changes and preventive measures are most likely to keep you healthy. Ask your health care provider for more information. Weight and diet Eat a healthy diet  Be sure to include plenty of vegetables, fruits, low-fat dairy products, and lean protein.  Do not eat a lot of foods high in solid fats, added sugars, or salt.  Get regular exercise. This is one of the most important things you can do for your health. ? Most adults should exercise for at least 150 minutes each week. The exercise should increase your heart rate and make you sweat (moderate-intensity exercise). ? Most adults should also do strengthening exercises at least twice a week. This is in addition to the  moderate-intensity exercise.  Maintain a healthy weight  Body mass index (BMI) is a measurement that can be used to identify possible weight problems. It estimates body fat based on height and weight. Your health care provider can help determine your BMI and help you achieve or maintain a healthy weight.  For females 20 years of age and older: ? A BMI below 18.5 is considered underweight. ? A BMI of 18.5 to 24.9 is normal. ? A BMI of 25 to 29.9 is considered overweight. ? A BMI of 30 and above is considered obese.  Watch levels of cholesterol and blood lipids  You should start having your blood tested for lipids and cholesterol at 51 years of age, then have this test every 5 years.  You may need to have your cholesterol levels checked more often if: ? Your lipid or cholesterol levels are high. ? You are older than 50 years of age. ? You are at high risk for heart disease.  Cancer screening Lung Cancer  Lung cancer screening is recommended for adults 55-80 years old who are at high risk for lung cancer because of a history of smoking.  A yearly low-dose CT scan of the lungs is recommended for people who: ? Currently smoke. ? Have quit within the past 15 years. ? Have at least a 30-pack-year history of smoking. A pack year is smoking an average of one pack of cigarettes a day for 1 year.  Yearly screening should continue until it has been 15 years since you quit.  Yearly   screening should stop if you develop a health problem that would prevent you from having lung cancer treatment.  Breast Cancer  Practice breast self-awareness. This means understanding how your breasts normally appear and feel.  It also means doing regular breast self-exams. Let your health care provider know about any changes, no matter how small.  If you are in your 20s or 30s, you should have a clinical breast exam (CBE) by a health care provider every 1-3 years as part of a regular health exam.  If you  are 73 or older, have a CBE every year. Also consider having a breast X-ray (mammogram) every year.  If you have a family history of breast cancer, talk to your health care provider about genetic screening.  If you are at high risk for breast cancer, talk to your health care provider about having an MRI and a mammogram every year.  Breast cancer gene (BRCA) assessment is recommended for women who have family members with BRCA-related cancers. BRCA-related cancers include: ? Breast. ? Ovarian. ? Tubal. ? Peritoneal cancers.  Results of the assessment will determine the need for genetic counseling and BRCA1 and BRCA2 testing.  Cervical Cancer Your health care provider may recommend that you be screened regularly for cancer of the pelvic organs (ovaries, uterus, and vagina). This screening involves a pelvic examination, including checking for microscopic changes to the surface of your cervix (Pap test). You may be encouraged to have this screening done every 3 years, beginning at age 61.  For women ages 90-65, health care providers may recommend pelvic exams and Pap testing every 3 years, or they may recommend the Pap and pelvic exam, combined with testing for human papilloma virus (HPV), every 5 years. Some types of HPV increase your risk of cervical cancer. Testing for HPV may also be done on women of any age with unclear Pap test results.  Other health care providers may not recommend any screening for nonpregnant women who are considered low risk for pelvic cancer and who do not have symptoms. Ask your health care provider if a screening pelvic exam is right for you.  If you have had past treatment for cervical cancer or a condition that could lead to cancer, you need Pap tests and screening for cancer for at least 20 years after your treatment. If Pap tests have been discontinued, your risk factors (such as having a new sexual partner) need to be reassessed to determine if screening should  resume. Some women have medical problems that increase the chance of getting cervical cancer. In these cases, your health care provider may recommend more frequent screening and Pap tests.  Colorectal Cancer  This type of cancer can be detected and often prevented.  Routine colorectal cancer screening usually begins at 51 years of age and continues through 51 years of age.  Your health care provider may recommend screening at an earlier age if you have risk factors for colon cancer.  Your health care provider may also recommend using home test kits to check for hidden blood in the stool.  A small camera at the end of a tube can be used to examine your colon directly (sigmoidoscopy or colonoscopy). This is done to check for the earliest forms of colorectal cancer.  Routine screening usually begins at age 67.  Direct examination of the colon should be repeated every 5-10 years through 51 years of age. However, you may need to be screened more often if early forms of precancerous polyps  or small growths are found.  Skin Cancer  Check your skin from head to toe regularly.  Tell your health care provider about any new moles or changes in moles, especially if there is a change in a mole's shape or color.  Also tell your health care provider if you have a mole that is larger than the size of a pencil eraser.  Always use sunscreen. Apply sunscreen liberally and repeatedly throughout the day.  Protect yourself by wearing long sleeves, pants, a wide-brimmed hat, and sunglasses whenever you are outside.  Heart disease, diabetes, and high blood pressure  High blood pressure causes heart disease and increases the risk of stroke. High blood pressure is more likely to develop in: ? People who have blood pressure in the high end of the normal range (130-139/85-89 mm Hg). ? People who are overweight or obese. ? People who are African American.  If you are 18-39 years of age, have your blood  pressure checked every 3-5 years. If you are 40 years of age or older, have your blood pressure checked every year. You should have your blood pressure measured twice-once when you are at a hospital or clinic, and once when you are not at a hospital or clinic. Record the average of the two measurements. To check your blood pressure when you are not at a hospital or clinic, you can use: ? An automated blood pressure machine at a pharmacy. ? A home blood pressure monitor.  If you are between 55 years and 79 years old, ask your health care provider if you should take aspirin to prevent strokes.  Have regular diabetes screenings. This involves taking a blood sample to check your fasting blood sugar level. ? If you are at a normal weight and have a low risk for diabetes, have this test once every three years after 51 years of age. ? If you are overweight and have a high risk for diabetes, consider being tested at a younger age or more often. Preventing infection Hepatitis B  If you have a higher risk for hepatitis B, you should be screened for this virus. You are considered at high risk for hepatitis B if: ? You were born in a country where hepatitis B is common. Ask your health care provider which countries are considered high risk. ? Your parents were born in a high-risk country, and you have not been immunized against hepatitis B (hepatitis B vaccine). ? You have HIV or AIDS. ? You use needles to inject street drugs. ? You live with someone who has hepatitis B. ? You have had sex with someone who has hepatitis B. ? You get hemodialysis treatment. ? You take certain medicines for conditions, including cancer, organ transplantation, and autoimmune conditions.  Hepatitis C  Blood testing is recommended for: ? Everyone born from 1945 through 1965. ? Anyone with known risk factors for hepatitis C.  Sexually transmitted infections (STIs)  You should be screened for sexually transmitted  infections (STIs) including gonorrhea and chlamydia if: ? You are sexually active and are younger than 51 years of age. ? You are older than 51 years of age and your health care provider tells you that you are at risk for this type of infection. ? Your sexual activity has changed since you were last screened and you are at an increased risk for chlamydia or gonorrhea. Ask your health care provider if you are at risk.  If you do not have HIV, but are at risk,   it may be recommended that you take a prescription medicine daily to prevent HIV infection. This is called pre-exposure prophylaxis (PrEP). You are considered at risk if: ? You are sexually active and do not regularly use condoms or know the HIV status of your partner(s). ? You take drugs by injection. ? You are sexually active with a partner who has HIV.  Talk with your health care provider about whether you are at high risk of being infected with HIV. If you choose to begin PrEP, you should first be tested for HIV. You should then be tested every 3 months for as long as you are taking PrEP. Pregnancy  If you are premenopausal and you may become pregnant, ask your health care provider about preconception counseling.  If you may become pregnant, take 400 to 800 micrograms (mcg) of folic acid every day.  If you want to prevent pregnancy, talk to your health care provider about birth control (contraception). Osteoporosis and menopause  Osteoporosis is a disease in which the bones lose minerals and strength with aging. This can result in serious bone fractures. Your risk for osteoporosis can be identified using a bone density scan.  If you are 34 years of age or older, or if you are at risk for osteoporosis and fractures, ask your health care provider if you should be screened.  Ask your health care provider whether you should take a calcium or vitamin D supplement to lower your risk for osteoporosis.  Menopause may have certain physical  symptoms and risks.  Hormone replacement therapy may reduce some of these symptoms and risks. Talk to your health care provider about whether hormone replacement therapy is right for you. Follow these instructions at home:  Schedule regular health, dental, and eye exams.  Stay current with your immunizations.  Do not use any tobacco products including cigarettes, chewing tobacco, or electronic cigarettes.  If you are pregnant, do not drink alcohol.  If you are breastfeeding, limit how much and how often you drink alcohol.  Limit alcohol intake to no more than 1 drink per day for nonpregnant women. One drink equals 12 ounces of beer, 5 ounces of wine, or 1 ounces of hard liquor.  Do not use street drugs.  Do not share needles.  Ask your health care provider for help if you need support or information about quitting drugs.  Tell your health care provider if you often feel depressed.  Tell your health care provider if you have ever been abused or do not feel safe at home. This information is not intended to replace advice given to you by your health care provider. Make sure you discuss any questions you have with your health care provider. Document Released: 09/23/2010 Document Revised: 08/16/2015 Document Reviewed: 12/12/2014 Elsevier Interactive Patient Education  [employer].

## 2017-07-10 LAB — COMPREHENSIVE METABOLIC PANEL
A/G RATIO: 2.4 — AB (ref 1.2–2.2)
ALT: 15 IU/L (ref 0–32)
AST: 21 IU/L (ref 0–40)
Albumin: 4.8 g/dL (ref 3.5–5.5)
Alkaline Phosphatase: 65 IU/L (ref 39–117)
BILIRUBIN TOTAL: 0.7 mg/dL (ref 0.0–1.2)
BUN/Creatinine Ratio: 8 — ABNORMAL LOW (ref 9–23)
BUN: 6 mg/dL (ref 6–24)
CALCIUM: 9.9 mg/dL (ref 8.7–10.2)
CHLORIDE: 97 mmol/L (ref 96–106)
CO2: 24 mmol/L (ref 20–29)
Creatinine, Ser: 0.75 mg/dL (ref 0.57–1.00)
GFR, EST AFRICAN AMERICAN: 107 mL/min/{1.73_m2} (ref >59)
GFR, EST NON AFRICAN AMERICAN: 93 mL/min/{1.73_m2} (ref >59)
GLUCOSE: 88 mg/dL (ref 65–99)
Globulin, Total: 2 g/dL (ref 1.5–4.5)
POTASSIUM: 4.1 mmol/L (ref 3.5–5.2)
Sodium: 136 mmol/L (ref 134–144)
TOTAL PROTEIN: 6.8 g/dL (ref 6.0–8.5)

## 2017-07-10 LAB — CBC WITH DIFFERENTIAL/PLATELET
BASOS ABS: 0 10*3/uL (ref 0.0–0.2)
BASOS: 0 %
EOS (ABSOLUTE): 0.2 10*3/uL (ref 0.0–0.4)
Eos: 2 %
Hematocrit: 41.2 % (ref 34.0–46.6)
Hemoglobin: 13.5 g/dL (ref 11.1–15.9)
IMMATURE GRANS (ABS): 0 10*3/uL (ref 0.0–0.1)
IMMATURE GRANULOCYTES: 0 %
LYMPHS: 28 %
Lymphocytes Absolute: 2.8 10*3/uL (ref 0.7–3.1)
MCH: 30.8 pg (ref 26.6–33.0)
MCHC: 32.8 g/dL (ref 31.5–35.7)
MCV: 94 fL (ref 79–97)
MONOS ABS: 0.8 10*3/uL (ref 0.1–0.9)
Monocytes: 8 %
NEUTROS PCT: 62 %
Neutrophils Absolute: 6.4 10*3/uL (ref 1.4–7.0)
PLATELETS: 425 10*3/uL — AB (ref 150–379)
RBC: 4.39 x10E6/uL (ref 3.77–5.28)
RDW: 14.5 % (ref 12.3–15.4)
WBC: 10.3 10*3/uL (ref 3.4–10.8)

## 2017-07-10 LAB — HEMOGLOBIN A1C
ESTIMATED AVERAGE GLUCOSE: 120 mg/dL
Hgb A1c MFr Bld: 5.8 % — ABNORMAL HIGH (ref 4.8–5.6)

## 2017-07-10 LAB — HIV ANTIBODY (ROUTINE TESTING W REFLEX): HIV SCREEN 4TH GENERATION: NONREACTIVE

## 2017-07-10 LAB — TSH: TSH: 1.86 u[IU]/mL (ref 0.450–4.500)

## 2017-07-12 LAB — PAP IG AND CT-NG NAA
Chlamydia, Nuc. Acid Amp: NEGATIVE
Gonococcus by Nucleic Acid Amp: NEGATIVE
PAP SMEAR COMMENT: 0

## 2017-07-14 ENCOUNTER — Encounter: Payer: Self-pay | Admitting: Radiology

## 2017-07-16 ENCOUNTER — Telehealth: Payer: Self-pay

## 2017-07-16 NOTE — Telephone Encounter (Signed)
Pt given results per notes of Dr. Alvy BimlerSagardia on 07/13/17.Unable to document in result note due to [facility].

## 2017-07-16 NOTE — Telephone Encounter (Signed)
Copied from CRM [phone]. Topic: Quick Communication - Lab Results >> Jul 14, 2017  8:55 AM Clydia Llanoosenthal, [place] wrote: Called patient to inform them of  lab results. When patient returns call, triage nurse may disclose results.

## 2017-08-20 ENCOUNTER — Emergency Department (HOSPITAL_COMMUNITY): Payer: Self-pay

## 2017-08-20 ENCOUNTER — Emergency Department (HOSPITAL_COMMUNITY)
Admission: EM | Admit: 2017-08-20 | Discharge: 2017-08-20 | Disposition: A | Discharge status: Home / Self Care | Payer: Self-pay | Attending: Emergency Medicine | Admitting: Emergency Medicine

## 2017-08-20 ENCOUNTER — Encounter (HOSPITAL_COMMUNITY): Payer: Self-pay | Admitting: Emergency Medicine

## 2017-08-20 DIAGNOSIS — R42 Dizziness and giddiness: Secondary | ICD-10-CM | POA: Insufficient documentation

## 2017-08-20 DIAGNOSIS — Z79899 Other long term (current) drug therapy: Secondary | ICD-10-CM | POA: Insufficient documentation

## 2017-08-20 DIAGNOSIS — E871 Hypo-osmolality and hyponatremia: Secondary | ICD-10-CM | POA: Insufficient documentation

## 2017-08-20 LAB — URINALYSIS, ROUTINE W REFLEX MICROSCOPIC
BACTERIA UA: NONE SEEN
BILIRUBIN URINE: NEGATIVE
Glucose, UA: NEGATIVE mg/dL
KETONES UR: 5 mg/dL — AB
Leukocytes, UA: NEGATIVE
NITRITE: NEGATIVE
PROTEIN: NEGATIVE mg/dL
Specific Gravity, Urine: 1.003 — ABNORMAL LOW (ref 1.005–1.030)
pH: 7 (ref 5.0–8.0)

## 2017-08-20 LAB — BASIC METABOLIC PANEL
ANION GAP: 7 (ref 5–15)
BUN: 7 mg/dL (ref 6–20)
CALCIUM: 9.2 mg/dL (ref 8.9–10.3)
CO2: 28 mmol/L (ref 22–32)
Chloride: 92 mmol/L — ABNORMAL LOW (ref 101–111)
Creatinine, Ser: 0.74 mg/dL (ref 0.44–1.00)
Glucose, Bld: 105 mg/dL — ABNORMAL HIGH (ref 65–99)
POTASSIUM: 3.9 mmol/L (ref 3.5–5.1)
Sodium: 127 mmol/L — ABNORMAL LOW (ref 135–145)

## 2017-08-20 LAB — CBC
HEMATOCRIT: 39.1 % (ref 36.0–46.0)
HEMOGLOBIN: 13.6 g/dL (ref 12.0–15.0)
MCH: 32.3 pg (ref 26.0–34.0)
MCHC: 34.8 g/dL (ref 30.0–36.0)
MCV: 92.9 fL (ref 78.0–100.0)
Platelets: 387 10*3/uL (ref 150–400)
RBC: 4.21 MIL/uL (ref 3.87–5.11)
RDW: 13.7 % (ref 11.5–15.5)
WBC: 11.1 10*3/uL — ABNORMAL HIGH (ref 4.0–10.5)

## 2017-08-20 LAB — I-STAT BETA HCG BLOOD, ED (MC, WL, AP ONLY)

## 2017-08-20 LAB — CBG MONITORING, ED: GLUCOSE-CAPILLARY: 86 mg/dL (ref 65–99)

## 2017-08-20 NOTE — ED Provider Notes (Signed)
[facility]-EMERGENCY DEPT Provider Note   CSN: [id number] Arrival date & time: 08/20/17  1458     History   Chief Complaint Chief Complaint  Patient presents with  . Dizziness    HPI Vanessa Shaw is a 52 y.o. female history of ocular migraines, here presenting with dizziness, headache.  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  She states that she feels lightheaded the posterior aspect of the head.  She denies any blurry vision or changes in vision or vomiting. She denies any weakness or numbness.  She states that this is not similar to her previous ocular migraines. Denies personal or family hx of cerebral aneurysms   The history is provided by the patient and the spouse.    History reviewed. No pertinent past medical history.  There are no active problems to display for this patient.   History reviewed. No pertinent surgical history.   OB History   None      Home Medications    Prior to Admission medications   Medication Sig Start Date End Date Taking? Authorizing Provider  atorvastatin (LIPITOR) 40 MG tablet Take 40 mg by mouth daily. 08/15/17  Yes [provider]  CROMOLYN SODIUM OP Apply 1 drop to eye 4 (four) times daily.   Yes [provider]  ibuprofen (ADVIL,MOTRIN) 200 MG tablet Take 600 mg by mouth daily as needed ("sore").   Yes [provider]  mometasone (NASONEX) 50 MCG/ACT nasal spray PLACE 1 SPRAY INTO THE NOSE 2 (TWO) TIMES DAILY AS NEEDED. 06/18/17  Yes [provider]  montelukast (SINGULAIR) 10 MG tablet Take 10 mg by mouth every evening. 08/15/17  Yes [provider]  Olopatadine HCl (PAZEO OP) Place 1 drop into both eyes daily.    Yes [provider]  pantoprazole (PROTONIX) 40 MG tablet Take 40 mg by mouth daily. 08/15/17  Yes [provider]  RYVENT 6 MG TABS  Take 1 tablet by mouth every 8 (eight) hours as needed. 07/10/17  Yes [provider]    Family History No family history on file.  Social History Social History   Tobacco Use  . Smoking status: Not on file  Substance Use Topics  . Alcohol use: Not on file  . Drug use: Not on file     Allergies   Ciprofloxacin and Sulfa antibiotics   Review of Systems Review of Systems  Neurological: Positive for dizziness.  All other systems reviewed and are negative.    Physical Exam Updated Vital Signs BP (!) 144/89 (BP Location: Left Arm)   Pulse 69   Temp 98.5 F (36.9 C) (Oral)   Resp 16   Ht  (1.575 m)   Wt 59.1 kg (130 lb 3.2 oz)   SpO2 96%   BMI 23.81 kg/m   Physical Exam  Constitutional: She is oriented to person, place, and time. She appears well-developed.  HENT:  Head: Normocephalic and atraumatic.  Mouth/Throat: Oropharynx is clear and moist.  Eyes: Pupils are equal, round, and reactive to light. Conjunctivae and EOM are normal.  Neck: Normal range of motion. Neck supple.  Cardiovascular: Normal rate, regular rhythm and normal heart sounds.  Pulmonary/Chest: Effort normal and breath sounds normal. No stridor. No respiratory distress. She has no wheezes.  Abdominal: Soft. Bowel sounds are normal. She exhibits no distension. There is no  tenderness. There is no guarding.  Musculoskeletal: Normal range of motion. She exhibits no edema.  Neurological: She is alert and oriented to person, place, and time. She displays normal reflexes. No cranial nerve deficit. Coordination normal.  CN 2- 12 intact. Nl strength and sensation. Nl finger to nose. Nl gait   Skin: Skin is warm.  Psychiatric: She has a normal mood and affect.  Nursing note and vitals reviewed.    ED Treatments / Results  Labs (all labs ordered are listed, but only abnormal results are displayed) Labs Reviewed  BASIC METABOLIC PANEL - Abnormal; Notable for the following components:       Result Value   Sodium 127 (*)    Chloride 92 (*)    Glucose, Bld 105 (*)    All other components within normal limits  CBC - Abnormal; Notable for the following components:   WBC 11.1 (*)    All other components within normal limits  URINALYSIS, ROUTINE W REFLEX MICROSCOPIC - Abnormal; Notable for the following components:   Color, Urine STRAW (*)    Specific Gravity, Urine 1.003 (*)    Hgb urine dipstick MODERATE (*)    Ketones, ur 5 (*)    All other components within normal limits  CBG MONITORING, ED  CBG MONITORING, ED  I-STAT BETA HCG BLOOD, ED ([facility], [facility], [facility] ONLY)    EKG EKG Interpretation  Date/Time:  Thursday Aug 20 2017 15:10:22 EDT Ventricular Rate:  78 PR Interval:    QRS Duration: 90 QT Interval:  365 QTC Calculation: 416 R Axis:   70 Text Interpretation:  Sinus rhythm No previous ECGs available Confirmed by Richardean Canal [id number]) on 08/20/2017 6:09:39 PM   Radiology Ct Head Wo Contrast  Result Date: 08/20/2017 CLINICAL DATA:  Sudden onset dizziness 1 hour ago. EXAM: CT HEAD WITHOUT CONTRAST TECHNIQUE: Contiguous axial images were obtained from the base of the skull through the vertex without intravenous contrast. COMPARISON:  None. FINDINGS: Brain: No evidence of acute infarction, hemorrhage, hydrocephalus, extra-axial collection or mass lesion/mass effect. Vascular: No hyperdense vessel or unexpected calcification. Skull: Normal. Negative for fracture or focal lesion. Sinuses/Orbits: No acute finding. Other: None. IMPRESSION: No cause for the patient's symptoms identified. No acute intracranial abnormality noted. Electronically Signed   By: Gerome Sam III M.D   On: 08/20/2017 19:53    Procedures Procedures (including critical care time)  Medications Ordered in ED Medications - No data to display   Initial Impression / Assessment and Plan / ED Course  I have reviewed the triage vital signs and the nursing notes.  Pertinent labs & imaging results that were  available during my care of the patient were reviewed by me and considered in my medical decision making (see chart for details).     Vanessa Shaw is a 52 y.o. female here with dizziness, headaches. Likely migraines. Consider SAH as well and she is within 4 hr window so if CT head neg, will not need LP. Will get labs as well.   8:22 PM CT head unremarkable. Labs showed sodium of 127. UA showed no UTI but there is very low specific gravity. She states that she drinks lots of water. I wonder if she has hyponatremia causing her symptoms. I told her to eat more salt or to drink gatorate instead. Recommend repeat BMP in a week.    Final Clinical Impressions(s) / ED Diagnoses   Final diagnoses:  None    ED Discharge Orders    None  Charlynne Pander, MD 08/20/17 [phone]

## 2017-08-20 NOTE — ED Notes (Signed)
EDP Freida Busman made aware of patient status.

## 2017-08-20 NOTE — Discharge Instructions (Signed)
Your salt level is low and that is likely causing your dizziness. Increase salt intake and drink gatorade instead of water.   Continue tylenol, motrin for headaches.   Repeat BMP with your doctor in 1-2 weeks   See your doctor  Return to ER if you have worse headaches, dizziness, passing out, weakness, numbness, trouble speaking.

## 2017-08-20 NOTE — ED Triage Notes (Signed)
Patient c/o sudden onset of dizziness x1 hour ago. Denies headache and blurred vision. A&Ox4. Ambulatory.

## 2017-08-21 ENCOUNTER — Encounter: Payer: Self-pay | Admitting: Emergency Medicine

## 2017-12-01 ENCOUNTER — Telehealth: Payer: Self-pay | Admitting: *Deleted

## 2017-12-01 MED ORDER — MONTELUKAST SODIUM 10 MG PO TABS: 10.0000 mg | ORAL_TABLET | Freq: Every day | ORAL | 3 refills | Status: AC

## 2017-12-01 MED ORDER — LEVOCETIRIZINE DIHYDROCHLORIDE 5 MG PO TABS: 5.0000 mg | ORAL_TABLET | Freq: Every evening | ORAL | 3 refills | Status: AC

## 2017-12-01 MED ORDER — MOMETASONE FUROATE 50 MCG/ACT NA SUSP: 1.0000 | Freq: Two times a day (BID) | NASAL | 3 refills | Status: AC | PRN

## 2017-12-01 NOTE — Telephone Encounter (Signed)
Pt has moved to [place]. She wanted refills on her singulair, levocetirizine, mometasone nasal spray. Sent meds, she will work on finding a Lawyer.

## 2017-12-03 ENCOUNTER — Ambulatory Visit: Payer: 59 | Admitting: Allergy and Immunology

## 2017-12-08 ENCOUNTER — Telehealth: Payer: Self-pay | Admitting: Emergency Medicine

## 2017-12-08 NOTE — Telephone Encounter (Signed)
Copied from CRM [id number]. Topic: Quick Communication - Rx Refill/Question >> Dec 08, 2017 10:45 AM Gaynelle AduPoole, Shalonda wrote: Medication: atorvastatin (LIPITOR) 40 MG tablet,pantoprazole (PROTONIX) 40 MG tablet  Has the patient contacted their pharmacy? No   Preferred Pharmacy (with phone number or street name): [id number] - [place], [place] - [phone] (Phone) [phone] (Fax)    Agent: Please be advised that RX refills may take up to 3 business days. We ask that you follow-up with your pharmacy.

## 2017-12-11 NOTE — Telephone Encounter (Signed)
Lipitor 40 mg tab refill. Previously prescribed by historical provider. No lipid panel noted in lab results.  Pantoprazole (Protonix) 40 mg tab refill. Previously prescribed by historical provider.  Last OV: 07/09/17 PCP: Dr. Alvy BimlerSagardia Pharmacy:[facility] in [place]  [address]  Unable to refill medications per protocol. Medications not addressed during OV on 07/09/17 and previously filled by historical provider.   Called pt and spoke to pt regarding refills of requested medications. Pt states at the time of her visit with Dr. Alvy BimlerSagardia she did not need refills and the last refills were by her doctor in [place] before moving. Pt also states that she believes that she had a lipid panel done by another provider before her visit with Dr. Alvy BimlerSagardia.Pt asking if she could have refills of the medications until she is able to find a provider in [place]. Pt notes that she is out of the medications at this time.

## 2017-12-14 ENCOUNTER — Telehealth: Payer: Self-pay | Admitting: *Deleted

## 2017-12-14 MED ORDER — ATORVASTATIN CALCIUM 40 MG PO TABS: 40.0000 mg | ORAL_TABLET | Freq: Every day | ORAL | 0 refills | Status: AC

## 2017-12-14 MED ORDER — PANTOPRAZOLE SODIUM 40 MG PO TBEC: 40.0000 mg | DELAYED_RELEASE_TABLET | Freq: Every day | ORAL | 0 refills | Status: AC

## 2017-12-14 NOTE — Telephone Encounter (Signed)
Done

## 2017-12-14 NOTE — Telephone Encounter (Signed)
Pt advised medication sent to pharmacy for Atorvastatin and Protonix.

## 2017-12-14 NOTE — Addendum Note (Signed)
Addended by: Evie LacksSAGARDIA, Evarose Alt[place] J on: 12/14/2017 10:03 AM   Modules accepted: Orders

## 2017-12-14 NOTE — Telephone Encounter (Signed)
Pt is requesting a refill for Atorvastatin 40 mg and Protonix 40 mg. Last filled on 08/15/17 in epic. Per PEC note she stated that [place] has moved to [place] and would like refills until she finds a doctor there. CPE here was on 07/09/17.

## 2018-04-20 LAB — HM MAMMOGRAPHY: Mammography, External: NEGATIVE

## 2018-05-10 ENCOUNTER — Emergency Department: Admit: 2018-05-10 | Payer: PRIVATE HEALTH INSURANCE | Primary: Family Medicine

## 2018-05-10 ENCOUNTER — Emergency Department: Payer: PRIVATE HEALTH INSURANCE | Primary: Family Medicine

## 2018-05-10 ENCOUNTER — Inpatient Hospital Stay
Admit: 2018-05-10 | Discharge: 2018-05-11 | Disposition: A | Discharge status: Home / Self Care | Payer: PRIVATE HEALTH INSURANCE | Attending: Emergency Medicine

## 2018-05-10 DIAGNOSIS — R079 Chest pain, unspecified: Secondary | ICD-10-CM

## 2018-05-10 LAB — CBC WITH AUTO DIFFERENTIAL
Basophils %: 1 %
Basophils Absolute: 0.1 10*3/uL (ref 0.0–0.2)
Eosinophils %: 1.7 %
Eosinophils Absolute: 0.1 10*3/uL (ref 0.0–0.6)
Hematocrit: 42.4 % (ref 36.0–48.0)
Hemoglobin: 14.4 g/dL (ref 12.0–16.0)
Lymphocytes %: 28.6 %
Lymphocytes Absolute: 2.3 10*3/uL (ref 1.0–5.1)
MCH: 32.2 pg (ref 26.0–34.0)
MCHC: 33.9 g/dL (ref 31.0–36.0)
MCV: 95 fL (ref 80.0–100.0)
MPV: 7.3 fL (ref 5.0–10.5)
Monocytes %: 7.1 %
Monocytes Absolute: 0.6 10*3/uL (ref 0.0–1.3)
Neutrophils %: 61.6 %
Neutrophils Absolute: 5 10*3/uL (ref 1.7–7.7)
Platelets: 378 10*3/uL (ref 135–450)
RBC: 4.46 M/uL (ref 4.00–5.20)
RDW: 13.6 % (ref 12.4–15.4)
WBC: 8 10*3/uL (ref 4.0–11.0)

## 2018-05-10 LAB — COMPREHENSIVE METABOLIC PANEL
ALT: 11 U/L (ref 10–40)
AST: 25 U/L (ref 15–37)
Albumin/Globulin Ratio: 1.6 (ref 1.1–2.2)
Albumin: 3.4 g/dL (ref 3.4–5.0)
Alkaline Phosphatase: 39 U/L — ABNORMAL LOW (ref 40–129)
Anion Gap: 7 (ref 3–16)
BUN: 5 mg/dL — ABNORMAL LOW (ref 7–20)
CO2: 22 mmol/L (ref 21–32)
Calcium: 7.1 mg/dL — ABNORMAL LOW (ref 8.3–10.6)
Chloride: 98 mmol/L — ABNORMAL LOW (ref 99–110)
Creatinine: 0.9 mg/dL (ref 0.6–1.1)
GFR African American: >60 (ref >60)
GFR Non-African American: >60 (ref >60)
Globulin: 2.1 g/dL
Glucose: 85 mg/dL (ref 70–99)
Potassium: 5.2 mmol/L — ABNORMAL HIGH (ref 3.5–5.1)
Sodium: 127 mmol/L — ABNORMAL LOW (ref 136–145)
Total Bilirubin: 0.4 mg/dL (ref 0.0–1.0)
Total Protein: 5.5 g/dL — ABNORMAL LOW (ref 6.4–8.2)

## 2018-05-10 LAB — SPECIMEN REJECTION

## 2018-05-10 LAB — URINALYSIS
Bilirubin Urine: NEGATIVE
Glucose, Ur: NEGATIVE mg/dL
Ketones, Urine: NEGATIVE mg/dL
Leukocyte Esterase, Urine: NEGATIVE
Nitrite, Urine: NEGATIVE
Protein, UA: NEGATIVE mg/dL
Specific Gravity, UA: 1.01 (ref 1.005–1.030)
Urobilinogen, Urine: 0.2 E.U./dL (ref <2.0)
pH, UA: 7 (ref 5.0–8.0)

## 2018-05-10 LAB — TROPONIN: Troponin: <0.01 ng/mL (ref <0.01)

## 2018-05-10 LAB — LIPASE: Lipase: 30 U/L (ref 13.0–60.0)

## 2018-05-10 LAB — MICROSCOPIC URINALYSIS

## 2018-05-10 LAB — D-DIMER, QUANTITATIVE: D-Dimer, Quant: 473 ng/mL DDU — ABNORMAL HIGH (ref 0–229)

## 2018-05-10 LAB — PREGNANCY, URINE: HCG(Urine) Pregnancy Test: NEGATIVE

## 2018-05-10 LAB — BRAIN NATRIURETIC PEPTIDE: Pro-BNP: 57 pg/mL (ref 0–124)

## 2018-05-10 MED ORDER — NITROGLYCERIN 2 % TD OINT
2 % | Freq: Once | TRANSDERMAL | Status: AC
Start: 2018-05-10 — End: 2018-05-10
  Administered 2018-05-10: 22:00:00 1 [in_us] via TOPICAL

## 2018-05-10 MED ORDER — ASPIRIN 81 MG PO CHEW
81 MG | Freq: Once | ORAL | Status: AC
Start: 2018-05-10 — End: 2018-05-10
  Administered 2018-05-10: 22:00:00 243 mg via ORAL

## 2018-05-10 MED ORDER — IOPAMIDOL 76 % IV SOLN
76 % | Freq: Once | INTRAVENOUS | Status: AC | PRN
Start: 2018-05-10 — End: 2018-05-10
  Administered 2018-05-10: 23:00:00 75 mL via INTRAVENOUS

## 2018-05-10 MED FILL — ASPIRIN LOW STRENGTH 81 MG PO CHEW: 81 mg | ORAL | Qty: 3

## 2018-05-10 MED FILL — NITRO-BID 2 % TD OINT: 2 % | TRANSDERMAL | Qty: 1

## 2018-05-10 NOTE — ED Provider Notes (Signed)
[facility] Emergency Department    CHIEF COMPLAINT  Chief Complaint   Patient presents with   ??? Chest Pain     since this am      HISTORY OF PRESENT ILLNESS  Susan [name] is a 53 y.o. female  who presents to the ED complaining of chest discomfort, central, since this morning.  Has history of hiatal hernia on Protonix chronically.  Different sensation than her previous heartburn due to its severity and persistence today.  Under a lot of stress due to sick family member in the hospital with an MI.  Smoker.  On atorvastatin for HLD.  No dx of HTN but is hypertensive here.  No diabetes.  Just traveled here from [place] where she now lives to visit her mother.  Radiates into the back.  Feels a little SOB.  No abdominal pains, vomiting or diarrhea, but some nausea.  No personal history of cardiac assessments in the past.    No other complaints, modifying factors or associated symptoms.     I have reviewed the following from the nursing documentation.    Past Medical History:   Diagnosis Date   ??? GERD (gastroesophageal reflux disease)    ??? Hiatal hernia      Past Surgical History:   Procedure Laterality Date   ??? COLONOSCOPY      2011     History reviewed. No pertinent family history.  Social History     Socioeconomic History   ??? Marital status: Married     Spouse name: Not on file   ??? Number of children: Not on file   ??? Years of education: Not on file   ??? Highest education level: Not on file   Occupational History   ??? Not on file   Social Needs   ??? Financial resource strain: Not on file   ??? Food insecurity:     Worry: Not on file     Inability: Not on file   ??? Transportation needs:     Medical: Not on file     Non-medical: Not on file   Tobacco Use   ??? Smoking status: Current Every Day Smoker     Packs/day: 0.50   ??? Smokeless tobacco: Never Used   Substance and Sexual Activity   ??? Alcohol use: No   ??? Drug use: Not on file   ??? Sexual activity: Not on file   Lifestyle   ??? Physical activity:     Days per  week: Not on file     Minutes per session: Not on file   ??? Stress: Not on file   Relationships   ??? Social connections:     Talks on phone: Not on file     Gets together: Not on file     Attends religious service: Not on file     Active member of club or organization: Not on file     Attends meetings of clubs or organizations: Not on file     Relationship status: Not on file   ??? Intimate partner violence:     Fear of current or ex partner: Not on file     Emotionally abused: Not on file     Physically abused: Not on file     Forced sexual activity: Not on file   Other Topics Concern   ??? Not on file   Social History Narrative   ??? Not on file     No current facility-administered  medications for this encounter.      Current Outpatient Medications   Medication Sig Dispense Refill   ??? montelukast (SINGULAIR) 10 MG tablet Take 10 mg by mouth nightly     ??? atorvastatin (LIPITOR) 40 MG tablet Take 40 mg by mouth daily     ??? calcium citrate-vitamin D (CITRICAL + D) 315-250 MG-UNIT TABS per tablet Take 1 tablet by mouth 2 times daily (with meals)     ??? Loratadine (CLARITIN) 10 MG CAPS Take  by mouth.       ??? pantoprazole (PROTONIX) 40 MG tablet Take 40 mg by mouth daily.       Allergies   Allergen Reactions   ??? Cipro Xr Other (See Comments)     Muscle pain   ??? Sulfa Antibiotics Hives       REVIEW OF SYSTEMS  10 systems reviewed, pertinent positives per HPI otherwise noted to be negative.    PHYSICAL EXAM  BP 132/76    Pulse 80    Temp 98.4 ??F (36.9 ??C)    Resp 18    Wt 118 lb (53.5 kg)    LMP 07/19/2016    SpO2 97%    BMI 21.58 kg/m??    GENERAL APPEARANCE: Awake and alert. Cooperative. No distress.  HENT: Normocephalic. Atraumatic. Mucous membranes are moist.  NECK: Supple.   EYES: PERRL. EOM's grossly intact.  HEART/CHEST: RRR. No murmurs.  No chest wall tenderness.  LUNGS: Respirations unlabored. CTAB. Good air exchange. Speaking comfortably in full sentences.   ABDOMEN: No tenderness. Soft. Non-distended. No masses. No  organomegaly. No guarding or rebound. Normal bowel sounds throughout.  MUSCULOSKELETAL: No extremity edema. Compartments soft.  No deformity.  No tenderness in the extremities.  All extremities neurovascularly intact.  SKIN: Warm and dry. No acute rashes.   NEUROLOGICAL: Alert and oriented. CN's 2-12 intact. No gross facial drooping. Strength 5/5, sensation intact. 2 plus DTR's in knees bilaterally.  Gait normal.  PSYCHIATRIC: Normal mood and affect.    LABS  I have reviewed all labs for this visit.   Results for orders placed or performed during the hospital encounter of 05/10/18   CBC Auto Differential   Result Value Ref Range    WBC 8.0 4.0 - 11.0 K/uL    RBC 4.46 4.00 - 5.20 M/uL    Hemoglobin 14.4 12.0 - 16.0 g/dL    Hematocrit 78.242.4 95.636.0 - 48.0 %    MCV 95.0 80.0 - 100.0 fL    MCH 32.2 26.0 - 34.0 pg    MCHC 33.9 31.0 - 36.0 g/dL    RDW 21.313.6 08.612.4 - 57.815.4 %    Platelets 378 135 - 450 K/uL    MPV 7.3 5.0 - 10.5 fL    Neutrophils % 61.6 %    Lymphocytes % 28.6 %    Monocytes % 7.1 %    Eosinophils % 1.7 %    Basophils % 1.0 %    Neutrophils Absolute 5.0 1.7 - 7.7 K/uL    Lymphocytes Absolute 2.3 1.0 - 5.1 K/uL    Monocytes Absolute 0.6 0.0 - 1.3 K/uL    Eosinophils Absolute 0.1 0.0 - 0.6 K/uL    Basophils Absolute 0.1 0.0 - 0.2 K/uL   Urinalysis   Result Value Ref Range    Color, UA Yellow Straw/Yellow    Clarity, UA Clear Clear    Glucose, Ur Negative Negative mg/dL    Bilirubin Urine Negative Negative    Ketones, Urine Negative Negative  mg/dL    Specific Gravity, UA 1.010 1.005 - 1.030    Blood, Urine MODERATE (A) Negative    pH, UA 7.0 5.0 - 8.0    Protein, UA Negative Negative mg/dL    Urobilinogen, Urine 0.2 <2.0 E.U./dL    Nitrite, Urine Negative Negative    Leukocyte Esterase, Urine Negative Negative    Microscopic Examination YES     Urine Type NotGiven    SPECIMEN REJECTION   Result Value Ref Range    Rejected Test cmp     Reason for Rejection see below    Comprehensive Metabolic Panel   Result Value Ref  Range    Sodium 127 (L) 136 - 145 mmol/L    Potassium 5.2 (H) 3.5 - 5.1 mmol/L    Chloride 98 (L) 99 - 110 mmol/L    CO2 22 21 - 32 mmol/L    Anion Gap 7 3 - 16    Glucose 85 70 - 99 mg/dL    BUN 5 (L) 7 - 20 mg/dL    CREATININE 0.9 0.6 - 1.1 mg/dL    GFR Non-African American >60 >60    GFR African American >60 >60    Calcium 7.1 (L) 8.3 - 10.6 mg/dL    Total Protein 5.5 (L) 6.4 - 8.2 g/dL    Alb 3.4 3.4 - 5.0 g/dL    Albumin/Globulin Ratio 1.6 1.1 - 2.2    Total Bilirubin 0.4 0.0 - 1.0 mg/dL    Alkaline Phosphatase 39 (L) 40 - 129 U/L    ALT 11 10 - 40 U/L    AST 25 15 - 37 U/L    Globulin 2.1 g/dL   Pregnancy, Urine   Result Value Ref Range    HCG(Urine) Pregnancy Test Negative Detects HCG level >20 MIU/mL   Microscopic Urinalysis   Result Value Ref Range    WBC, UA 3-5 0 - 5 /HPF    RBC, UA 11-20 (A) 0 - 4 /HPF    Epi Cells 2-5 0 - 5 /HPF    Bacteria, UA 1+ (A) None Seen /HPF   Troponin   Result Value Ref Range    Troponin <0.01 <0.01 ng/mL   Brain Natriuretic Peptide   Result Value Ref Range    Pro-BNP 57 0 - 124 pg/mL   D-dimer, quantitative   Result Value Ref Range    D-Dimer, Quant 473 (H) 0 - 229 ng/mL DDU   Lipase   Result Value Ref Range    Lipase 30.0 13.0 - 60.0 U/L   Troponin   Result Value Ref Range    Troponin <0.01 <0.01 ng/mL   EKG 12 Lead   Result Value Ref Range    Ventricular Rate 81 BPM    Atrial Rate 81 BPM    P-R Interval 146 ms    QRS Duration 78 ms    Q-T Interval 348 ms    QTc Calculation (Bazett) 404 ms    P Axis 84 degrees    R Axis 64 degrees    T Axis 66 degrees    Diagnosis       Normal sinus rhythmNormal ECGNo previous ECGs available       The 12 lead EKG was interpreted by me as follows:  Rate: normal with a rate of 81  Rhythm: sinus  Axis: normal  Intervals: normal PR, narrow QRS, normal QTc  ST segments: no ST elevations or depressions  T waves: no abnormal inversions  Non-specific T wave  changes: not present  Prior EKG comparison: EKG dated 01/24/09 is not significantly  different    RADIOLOGY    Xr Chest Standard (2 Vw)    Result Date: 05/10/2018  EXAMINATION: TWO XRAY VIEWS OF THE CHEST 05/10/2018 4:40 pm COMPARISON: 02/20/2009 HISTORY: ORDERING SYSTEM PROVIDED HISTORY: chest pain TECHNOLOGIST PROVIDED HISTORY: Reason for exam:->chest pain Reason for Exam: chest pains since this morning Acuity: Acute Type of Exam: Initial FINDINGS: Heart size and pulmonary vessels within normal limits.  Lungs appear hyperinflated.  Lungs clear.  Costophrenic angles sharp     Probable obstructive airways disease with no acute process demonstrated     Ct Chest Pulmonary Embolism W Contrast    Result Date: 05/10/2018  EXAMINATION: CTA OF THE CHEST, 05/10/2018 6:01 pm TECHNIQUE: CTA of the chest was performed after the administration of intravenous contrast.  Multiplanar reformatted images are provided for review.  MIP images are provided for review. Dose modulation, iterative reconstruction, and/or weight based adjustment of the mA/kV was utilized to reduce the radiation dose to as low as reasonably achievable. COMPARISON: None HISTORY: ORDERING SYSTEM PROVIDED HISTORY: +D-dimer CP TECHNOLOGIST PROVIDED HISTORY: Reason for exam:->+D-dimer CP Reason for Exam: Chest pain and burning starting this am, hx of blood clot in right leg. Acuity: Acute Type of Exam: Initial Relevant Medical/Surgical History: Current smoker x20 yrs, no hx of surg to chest; hiatal hernia. Acute shortness of breath and chest pain. FINDINGS: Pulmonary Arteries: Pulmonary arteries are adequately opacified for evaluation.  No evidence of intraluminal filling defect to suggest pulmonary embolism.  Main pulmonary artery is normal in caliber. Mediastinum: No evidence of mediastinal lymphadenopathy.  The heart and pericardium demonstrate no acute abnormality.  There is no acute abnormality of the thoracic aorta. Lungs/pleura: The lungs are without acute process.  No focal consolidation or pulmonary edema.  No evidence of pleural effusion or  pneumothorax. Upper Abdomen: Limited images of the upper abdomen are unremarkable. Soft Tissues/Bones: No aggressive lytic or blastic bony lesion.     No evidence of pulmonary embolism or acute pulmonary abnormality.     ED COURSE/MDM  Patient seen and evaluated. Old records reviewed. Labs and imaging reviewed and results discussed with patient.      After initial evaluation, differential diagnostic considerations included: acute coronary syndrome, pulmonary embolism, COPD/asthma, pneumonia, musculoskeletal, reflux/PUD/gastritis, pneumothorax, CHF, thoracic aortic dissection, anxiety    The patient's ED workup was notable for chest pain with initially reassuring work-up including a normal EKG and a negative troponin.  Her d-dimer was positive but CAT scan demonstrates no PE or other intrathoracic findings.  She is on a chronic PPI and also states that anxiety may be contributing, however she is also mildly hyponatremic and was given fluids for this and has a history of hyperlipidemia, smoking, is hypertensive initially on arrival without a formal diagnosis of this and a family history of CAD.  As such based on her heart score I did recommend admission to the hospital however she is adamant that she would refuse this.  She was willing to compromise and obtain a delta troponin which is not my preferred management of her care however was the best I could convince her to do in a shared medical decision making discussion.  Her second troponin was negative.  She says because her mother was discharged from the hospital today for cardiac disease she is insisting that she needs to be discharged home.  She is from [place] and has no providers in this area but I  did recommend she follow-up closely with her PCP and consider an outpatient stress test urgently as well as return precautions strictly to the ED with any new or worsening symptoms including worsening chest pains, shortness of breath, syncope, or other  concerns.      During the patient's ED course, the patient was given:  Medications   aspirin chewable tablet 243 mg (243 mg Oral Given 05/10/18 1652)   nitroglycerin (NITRO-BID) 2 % ointment 1 inch (1 inch Topical Given 05/10/18 1652)   iopamidol (ISOVUE-370) 76 % injection 75 mL (75 mLs Intravenous Given 05/10/18 1806)   aluminum & magnesium hydroxide-simethicone (MAALOX) 30 mL, lidocaine viscous hcl (XYLOCAINE) 5 mL (GI COCKTAIL) ( Oral Given 05/10/18 1931)   0.9 % sodium chloride IV bolus 1,000 mL (1,000 mLs Intravenous New Bag 05/10/18 1931)        CLINICAL IMPRESSION  1. Acute chest pain    2. Hyponatremia        Blood pressure 132/76, pulse 80, temperature 98.4 ??F (36.9 ??C), resp. rate 18, weight 118 lb (53.5 kg), last menstrual period 07/19/2016, SpO2 97 %, not currently breastfeeding.    DISPOSITION  Susan [name] was discharged to home in stable condition.    I have discussed the findings of today's workup with the patient and addressed the patient's questions and concerns.  Important warning signs as well as new or worsening symptoms which would necessitate immediate return to the ED were discussed.  The plan is to discharge from the ED at this time, and the patient is in stable condition.  The patient acknowledged understanding is agreeable with this plan.      Follow-up with:  Your PCP in [place]    Schedule an appointment as soon as possible for a visit in 1 week  For symptom re-evaluation - get your sodium rechecked and consider a stress test    [facility]  ED  [address]  [place] [address]  [phone]  Go to   If symptoms worsen      DISCLAIMER: This chart was created using Dragon dictation software.  Efforts were made by me to ensure accuracy, however some errors may be present due to limitations of this technology and occasionally words are not transcribed correctly.       Dalbert Batman, MD  05/10/18 2042

## 2018-05-10 NOTE — ED Notes (Signed)
PIV in Right AC with sluggish flush and no blood return. PIV fluids hooked up but not continuously flowing (PIV very positional). RN attempting to reposition and site blowing. Site clean, dry, intact. Dressing applied.      Kelby Aline, RN  05/10/18 2007

## 2018-05-11 LAB — EKG 12-LEAD
Atrial Rate: 81 {beats}/min
P Axis: 84 degrees
P-R Interval: 146 ms
Q-T Interval: 348 ms
QRS Duration: 78 ms
QTc Calculation (Bazett): 404 ms
R Axis: 64 degrees
T Axis: 66 degrees
Ventricular Rate: 81 {beats}/min

## 2018-05-11 LAB — TROPONIN: Troponin: <0.01 ng/mL (ref <0.01)

## 2018-05-11 MED ORDER — ALUM & MAG HYDROXIDE-SIMETH 200-200-20 MG/5ML PO SUSP
200-200-205 MG/5ML | Freq: Once | ORAL | Status: AC
Start: 2018-05-11 — End: 2018-05-10
  Administered 2018-05-11: 01:00:00 via ORAL

## 2018-05-11 MED ORDER — SODIUM CHLORIDE 0.9% BOLUS (FLUID RESUSCITATION)
0.9 % | Freq: Once | INTRAVENOUS | Status: AC
Start: 2018-05-11 — End: 2018-05-10
  Administered 2018-05-11: 01:00:00 1000 mL via INTRAVENOUS

## 2018-05-11 MED FILL — MAG-AL PLUS 200-200-20 MG/5ML PO LIQD: 200-200-20 MG/5ML | ORAL | Qty: 30

## 2018-05-11 MED FILL — SODIUM CHLORIDE 0.9 % IV SOLN: 0.9 % | INTRAVENOUS | Qty: 1000

## 2018-11-10 IMAGING — CT CT HEAD W/O CM
3 series · 16 of 46 positions shown, 19 images · non-contrast
Comparison: None.

CLINICAL DATA: Sudden onset dizziness 1 hour ago.

EXAM:
CT HEAD WITHOUT CONTRAST
TECHNIQUE: Contiguous axial images were obtained from the base of the skull
through the vertex without intravenous contrast.

[Series 2: head wo · axial · 0.47mm/px · z∈[+1229,+1349]mm · 10 of 29 slices shown, 13 images]
[im 3/29  brain]
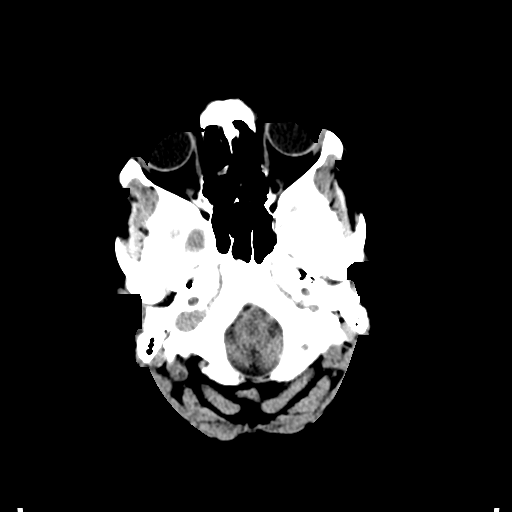
[im 3/29  bone]
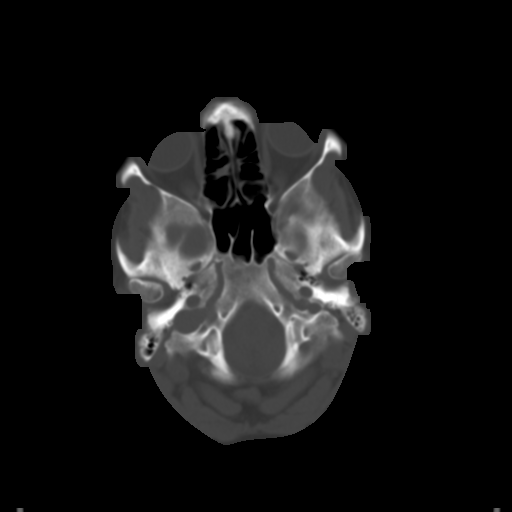
[im 6/29  brain]
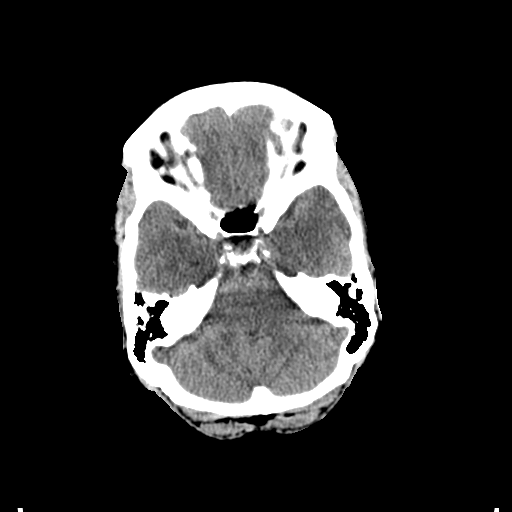
[im 8/29  brain]
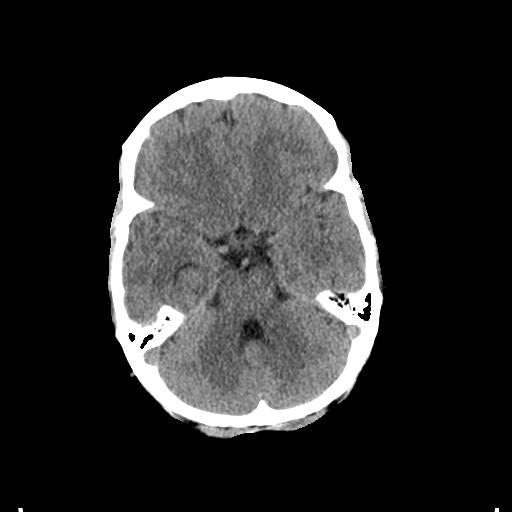
[im 11/29  brain]
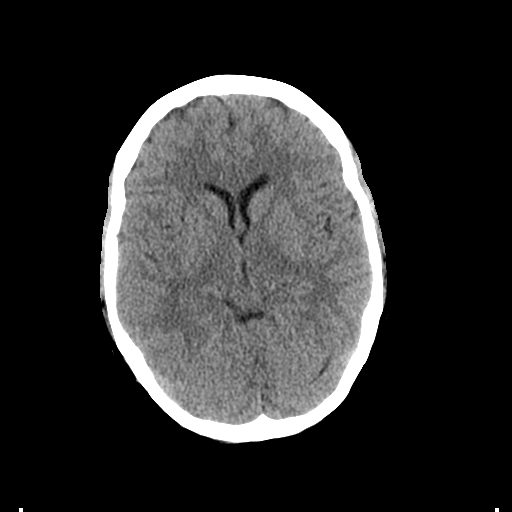
[im 14/29  brain]
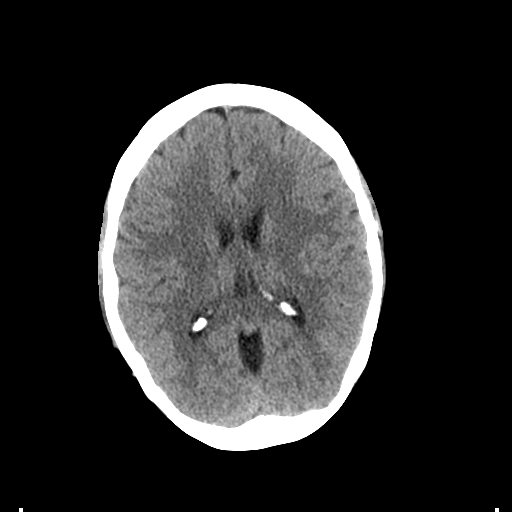
[im 14/29  bone]
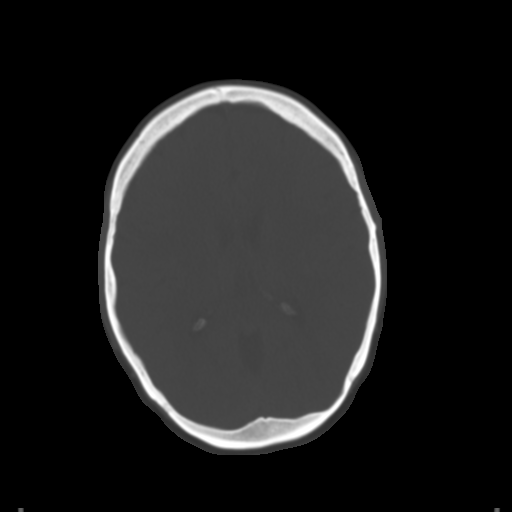
[im 16/29  brain]
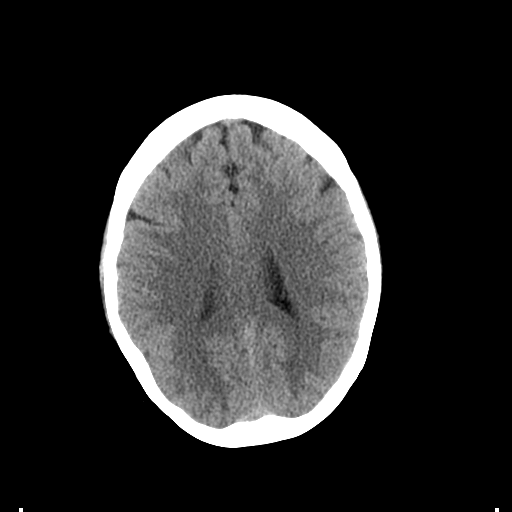
[im 19/29  brain]
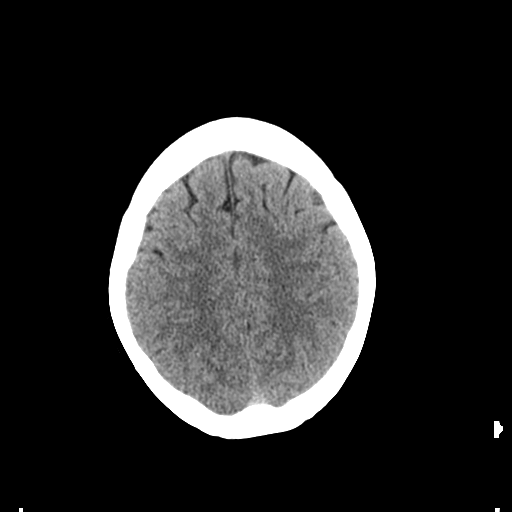
[im 22/29  brain]
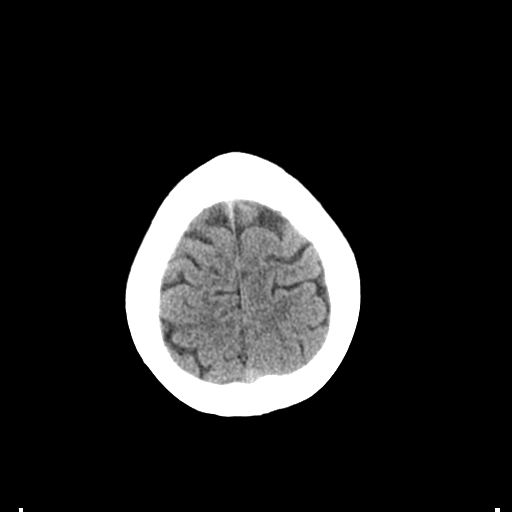
[im 24/29  brain]
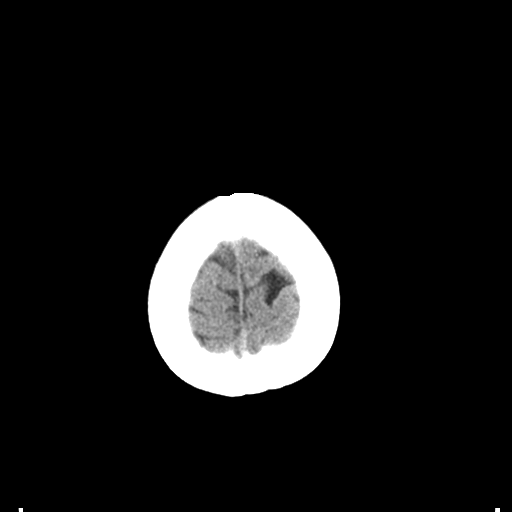
[im 24/29  bone]
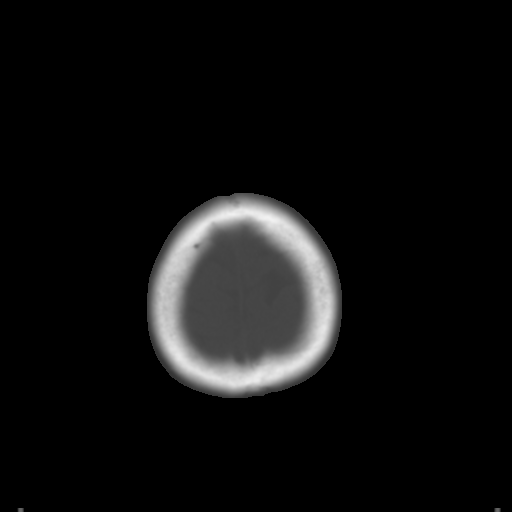
[im 27/29  brain]
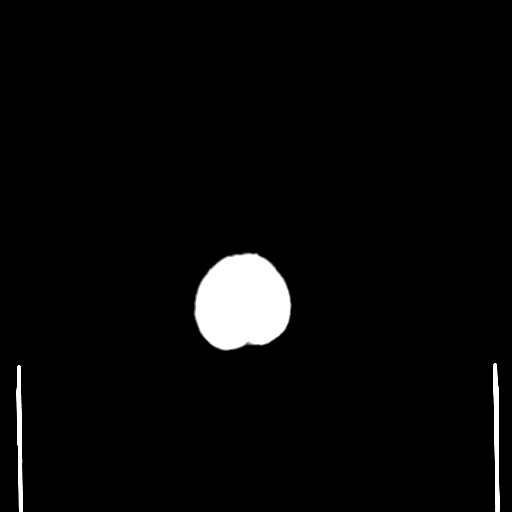

[Series 4: coronal soft tissue · coronal · 0.28mm/px · 3 of 63 slices shown]
[im 21/63  brain]
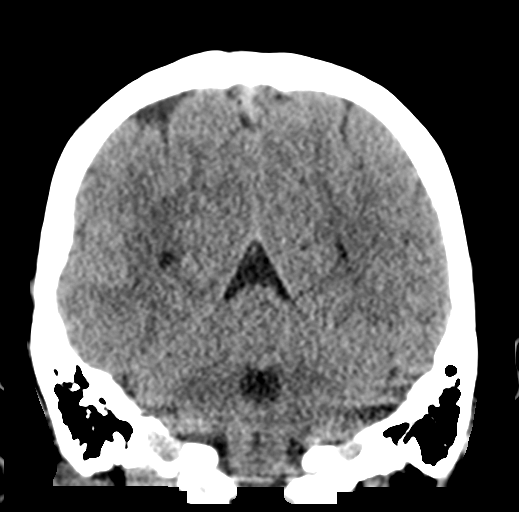
[im 28/63  brain]
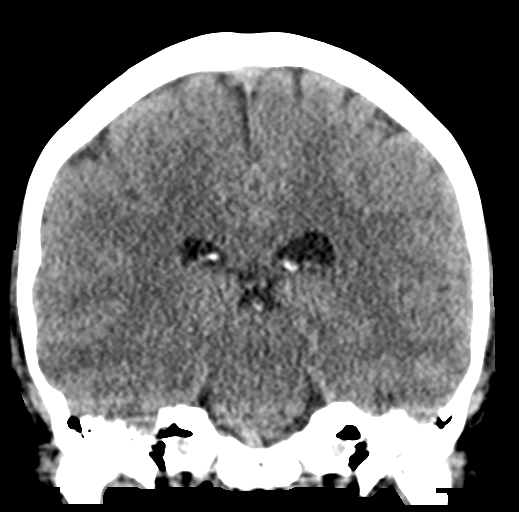
[im 35/63  brain]
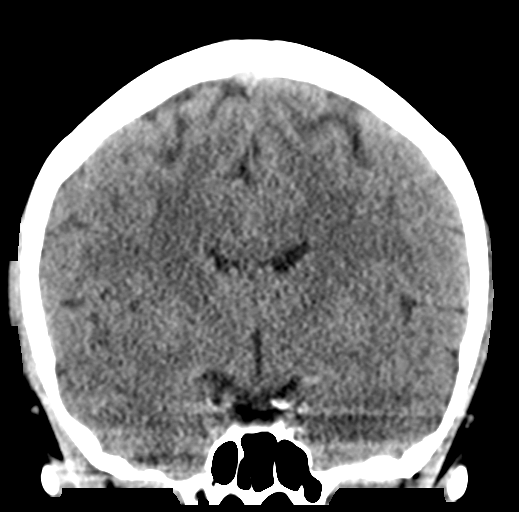

[Series 5: sagittal soft tissue · sagittal · 0.28mm/px · 3 of 49 slices shown]
[im 17/49  brain]
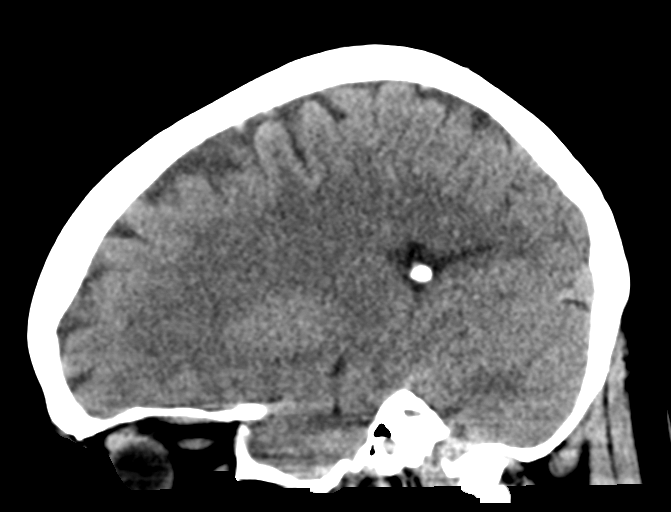
[im 25/49  brain]
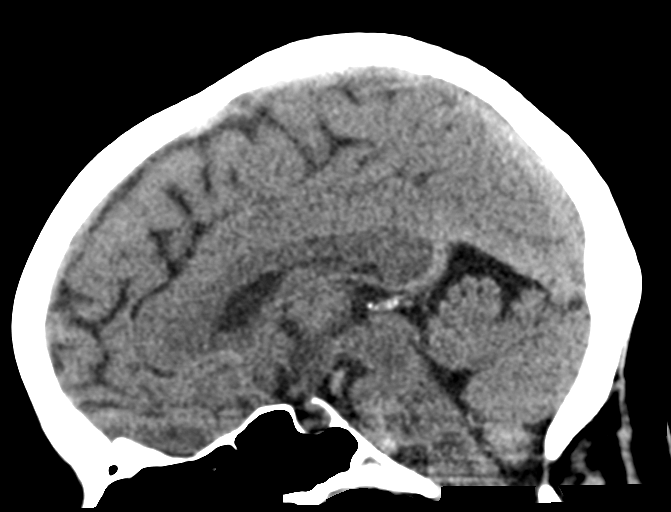
[im 33/49  brain]
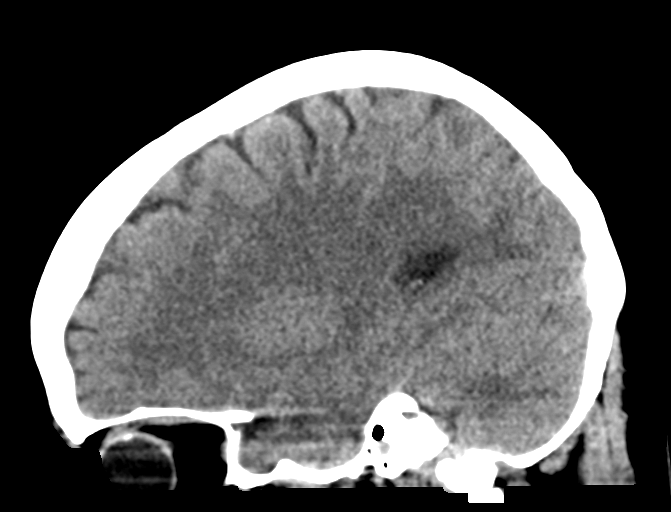

[16 of 46 positions shown; findings below may reference images not displayed]

FINDINGS: Brain: No evidence of acute infarction, hemorrhage, hydrocephalus,
extra-axial collection or mass lesion/mass effect.

Vascular: No hyperdense vessel or unexpected calcification.

Skull: Normal. Negative for fracture or focal lesion.

Sinuses/Orbits: No acute finding.

Other: None.
IMPRESSION: No cause for the patient's symptoms identified. No acute
intracranial abnormality noted.

## 2020-11-15 ENCOUNTER — Ambulatory Visit
Admit: 2020-11-15 | Discharge: 2020-11-15 | Payer: PRIVATE HEALTH INSURANCE | Attending: Family Medicine | Primary: Family Medicine

## 2020-11-15 ENCOUNTER — Inpatient Hospital Stay: Payer: PRIVATE HEALTH INSURANCE | Primary: Family Medicine

## 2020-11-15 DIAGNOSIS — Z0000 Encounter for general adult medical examination without abnormal findings: Secondary | ICD-10-CM

## 2020-11-15 DIAGNOSIS — Z7689 Persons encountering health services in other specified circumstances: Secondary | ICD-10-CM

## 2020-11-15 LAB — CBC WITH AUTO DIFFERENTIAL
Basophils %: 0.9 %
Basophils Absolute: 0.1 10*3/uL (ref 0.0–0.2)
Eosinophils %: 2.4 %
Eosinophils Absolute: 0.2 10*3/uL (ref 0.0–0.6)
Hematocrit: 42.3 % (ref 36.0–48.0)
Hemoglobin: 14.3 g/dL (ref 12.0–16.0)
Lymphocytes %: 20.1 %
Lymphocytes Absolute: 1.5 10*3/uL (ref 1.0–5.1)
MCH: 32.3 pg (ref 26.0–34.0)
MCHC: 33.8 g/dL (ref 31.0–36.0)
MCV: 95.5 fL (ref 80.0–100.0)
MPV: 7 fL (ref 5.0–10.5)
Monocytes %: 8.7 %
Monocytes Absolute: 0.7 10*3/uL (ref 0.0–1.3)
Neutrophils %: 67.9 %
Neutrophils Absolute: 5.1 10*3/uL (ref 1.7–7.7)
Platelets: 436 10*3/uL (ref 135–450)
RBC: 4.43 M/uL (ref 4.00–5.20)
RDW: 14.3 % (ref 12.4–15.4)
WBC: 7.6 10*3/uL (ref 4.0–11.0)

## 2020-11-15 LAB — LIPID PANEL
Cholesterol, Total: 163 mg/dL (ref 0–199)
HDL: 55 mg/dL (ref 40–60)
LDL Calculated: 93 mg/dL (ref <100)
Triglycerides: 77 mg/dL (ref 0–150)
VLDL Cholesterol Calculated: 15 mg/dL

## 2020-11-15 LAB — MAGNESIUM: Magnesium: 2 mg/dL (ref 1.80–2.40)

## 2020-11-15 LAB — MICROALBUMIN / CREATININE URINE RATIO
Creatinine, Ur: 53.8 mg/dL (ref 28.0–259.0)
Microalbumin, Random Urine: <1.2 mg/dL (ref <2.0)

## 2020-11-15 LAB — COMPREHENSIVE METABOLIC PANEL
ALT: 15 U/L (ref 10–40)
AST: 21 U/L (ref 15–37)
Albumin/Globulin Ratio: 1.8 (ref 1.1–2.2)
Albumin: 4.8 g/dL (ref 3.4–5.0)
Alkaline Phosphatase: 53 U/L (ref 40–129)
Anion Gap: 10 (ref 3–16)
BUN: 5 mg/dL — ABNORMAL LOW (ref 7–20)
CO2: 28 mmol/L (ref 21–32)
Calcium: 9.6 mg/dL (ref 8.3–10.6)
Chloride: 94 mmol/L — ABNORMAL LOW (ref 99–110)
Creatinine: 0.7 mg/dL (ref 0.6–1.1)
GFR African American: >60 (ref >60)
GFR Non-African American: >60 (ref >60)
Glucose: 85 mg/dL (ref 70–99)
Potassium: 4.4 mmol/L (ref 3.5–5.1)
Sodium: 132 mmol/L — ABNORMAL LOW (ref 136–145)
Total Bilirubin: 0.3 mg/dL (ref 0.0–1.0)
Total Protein: 7.4 g/dL (ref 6.4–8.2)

## 2020-11-15 LAB — VITAMIN D 25 HYDROXY: Vit D, 25-Hydroxy: 86.1 ng/mL (ref >=30)

## 2020-11-15 LAB — HEPATITIS C ANTIBODY: Hep C Ab Interp: NONREACTIVE

## 2020-11-15 MED ORDER — PANTOPRAZOLE SODIUM 40 MG PO TBEC
40 MG | ORAL_TABLET | Freq: Every day | ORAL | 1 refills | Status: DC
Start: 2020-11-15 — End: 2021-02-20

## 2020-11-15 MED ORDER — OLMESARTAN MEDOXOMIL 5 MG PO TABS
5 MG | ORAL_TABLET | ORAL | 1 refills | Status: DC
Start: 2020-11-15 — End: 2021-02-20

## 2020-11-15 MED ORDER — AZELASTINE HCL 0.15 % NA SOLN
0.15 % | NASAL | 3 refills | Status: AC
Start: 2020-11-15 — End: 2021-02-20

## 2020-11-15 MED ORDER — MONTELUKAST SODIUM 10 MG PO TABS
10 MG | ORAL_TABLET | Freq: Every evening | ORAL | 1 refills | Status: DC
Start: 2020-11-15 — End: 2021-02-20

## 2020-11-15 MED ORDER — LEVOCETIRIZINE DIHYDROCHLORIDE 5 MG PO TABS
5 MG | ORAL_TABLET | ORAL | 1 refills | Status: DC
Start: 2020-11-15 — End: 2021-02-20

## 2020-11-15 MED ORDER — EPINEPHRINE 0.3 MG/0.3ML IJ SOAJ
0.30.3 MG/ML | Freq: Once | INTRAMUSCULAR | 0 refills | Status: AC
Start: 2020-11-15 — End: 2020-11-15

## 2020-11-15 MED ORDER — MOMETASONE FUROATE 50 MCG/ACT NA SUSP
50 MCG/ACT | Freq: Every day | NASAL | 1 refills | Status: DC
Start: 2020-11-15 — End: 2021-02-12

## 2020-11-15 MED ORDER — ROSUVASTATIN CALCIUM 20 MG PO TABS
20 MG | ORAL_TABLET | ORAL | 1 refills | Status: DC
Start: 2020-11-15 — End: 2021-02-20

## 2020-11-15 NOTE — Other (Signed)
Left message for patient to call back

## 2020-11-15 NOTE — Progress Notes (Signed)
[facility]                                             [address] [place], [place] [address]        Phone: [phone]                                     Name:  Susan [name]  DOB:    08-04-1965      Consultants:   Patient Care Team:  Darlin Drop Minola Guin, DO as PCP - General (Family Medicine)    Chief Complaint:     Susan [name] is a 55 y.o. female  who presents today for a New Patient care visit with Personalized Prevention Plan Services as noted below.    HPI:     Susan [name] 55 y.o. female with PMH of allergies, hypertension, chronic hyponatremia, elevated platelet count, possible hyperlipidemia, GERD, vitamin D deficiency, and tobacco use    Allergies  She has multiple environmental allergies. She also has food allergies. 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     azelastine HCl 0.15 % SOLN, USE 2 SPRAY(S) TWICE DAILY INTRANASALLY AS DIRECTED    mometasone (NASONEX) 50 MCG/ACT nasal spray, 2 sprays by Nasal route    loratadine (CLARITIN) 10 MG capsule, Take  by mouth    levocetirizine (XYZAL) 5 MG tablet, TAKE 1 TABLET BY MOUTH ONCE DAILY IN THE EVENING  She has a prescription for epipen.   She is taking a break from Xyzal, 1 week, and is planning to restart next week. She is taking the Claritin during this break.      montelukast (SINGULAIR) 10 MG tablet, Take 10 mg by mouth nightly  She feels that her allergies are not well controlled. She has done allergy shots in the past. She had an allergist in [place]. She is not interested in getting referred to an allergist. She feels that she has tried everything.  She also uses simply saline nasal spray.     HTN    olmesartan (BENICAR) 5 MG tablet, TAKE 1 & 1/2 (ONE & ONE-HALF) TABLETS BY MOUTH ONCE DAILY  She was on an ACE prior and had issues with cough. She checks her blood pressure at home sometimes. Typically runs around  130/80-90. Patient denies cardiac symptoms.    Hyponatremia  Patient states she has chronic low sodium     Elevated platelet  Patient states she has chronic elevated platelets    Unclear    rosuvastatin (CRESTOR) 20 MG tablet, TAKE 1 TABLET BY MOUTH ONCE DAILY AT BEDTIME    GERD  Hiatal hernia. If she doesn't take Protonix she notices that her symptoms are worse. No reflux symptoms with protonix. She has had multiple EGDs in the past. No history of esophagitis that she knows of. She has seen Dr. Eugenio Hoes in the past.     pantoprazole (PROTONIX) 40 MG tablet, Take 40 mg by mouth daily.    Hx Vitamin D deficiency    calcium citrate-vitamin D (CITRICAL +  D) 315-250 MG-UNIT TABS per tablet, Take 1 tablet by mouth 2 times daily (with meals)    GAD 7 SCORE 11/15/2020   GAD-7 Total Score 0     Interpretation of GAD-7 score: 5-9 = mild anxiety, 10-14 = moderate anxiety, 15+ = severe anxiety. Recommend referral to behavioral health for scores 10 or greater.     PHQ Scores 11/15/2020   PHQ2 Score 0   PHQ9 Score 1     Interpretation of Total Score Depression Severity: 1-4 = Minimal depression, 5-9 = Mild depression, 10-14 = Moderate depression, 15-19 = Moderately severe depression, 20-27 = Severe depression     Tobacco use  Patient is at the contemplation (Acknowledges the subject but not prepared and/or interested in making a change) stage today.  Currently smoking half a pack a day.  She knows that she should quit.  Her goal was to focus on quitting once she moved back to [place].    There is no problem list on file for this patient.        Past Medical History:    Past Medical History:   Diagnosis Date    GERD (gastroesophageal reflux disease)     Hiatal hernia        Past Surgical History:  Past Surgical History:   Procedure Laterality Date    COLONOSCOPY      2011       Home Meds:  Prior to Visit Medications    Medication Sig Taking? Authorizing Provider   EPINEPHrine (EPIPEN) 0.3 MG/0.3ML SOAJ injection USE 1 PEN  INTRAMUSCULARLY AR NEEDED ONLY FOR SEVERE REACTION FOR 1 DAY Yes Historical Provider, MD   azelastine HCl 0.15 % SOLN USE 2 SPRAY(S) TWICE DAILY INTRANASALLY AS DIRECTED Yes Tekia Waterbury K Evone Arseneau, DO   levocetirizine (XYZAL) 5 MG tablet TAKE 1 TABLET BY MOUTH ONCE DAILY IN THE EVENING Yes Jnyah Brazee K Ifeanyi Mickelson, DO   mometasone (NASONEX) 50 MCG/ACT nasal spray 2 sprays by Nasal route daily Yes Jeffory Snelgrove K Covey Baller, DO   montelukast (SINGULAIR) 10 MG tablet Take 1 tablet by mouth nightly Yes Ailah Barna K Lillias Difrancesco, DO   olmesartan (BENICAR) 5 MG tablet TAKE 1 & 1/2 (ONE & ONE-HALF) TABLETS BY MOUTH ONCE DAILY Yes Freddy Kinne K Nikko Goldwire, DO   pantoprazole (PROTONIX) 40 MG tablet Take 1 tablet by mouth daily Yes Chynah Orihuela K Sharen Youngren, DO   rosuvastatin (CRESTOR) 20 MG tablet TAKE 1 TABLET BY MOUTH ONCE DAILY AT BEDTIME Yes Nekhi Liwanag K Lillee Mooneyhan, DO   EPINEPHrine (EPIPEN 2-PAK) 0.3 MG/0.3ML SOAJ injection Inject 0.3 mLs into the muscle once for 1 dose Use as directed for allergic reaction Yes Shanan Mcmiller K Zayvien Canning, DO   calcium citrate-vitamin D (CITRICAL + D) 315-250 MG-UNIT TABS per tablet Take 1 tablet by mouth 2 times daily (with meals) Yes Historical Provider, MD   loratadine (CLARITIN) 10 MG capsule Take  by mouth.   Yes Historical Provider, MD   mometasone (NASONEX) 50 MCG/ACT nasal spray 2 sprays by Nasal route daily as needed (Take 1 -2 sprays each nostril once daily as needed for ear congestion). Sample given  Awanda Mink, APRN - CNP       Allergies:    Food, Prednazoline, Cipro xr, and Sulfa antibiotics    Family History:   No family history on file.    Social History:  Social History       Tobacco History       Smoking Status  Every Day Smoking Frequency  0.50 packs/day Smoking  Tobacco Type  Cigarettes      Smokeless Tobacco Use  Never              Alcohol History       Alcohol Use Status  No              Drug Use       Drug Use Status  Not Asked              Sexual Activity       Sexually Active  Not Asked                       Health Maintenance  Completed:  Health Maintenance   Topic Date Due    Pneumococcal 0-64 years Vaccine (1 - PCV) Never done    HIV screen  Never done    Cervical cancer screen  Never done    Colorectal Cancer Screen  Never done    Breast cancer screen  Never done    Shingles vaccine (1 of 2) 10/08/2016    COVID-19 Vaccine (3 - Booster for Pfizer series) 01/23/2020    Flu vaccine (1) 11/22/2020    Lipids  11/15/2021    Depression Screen  11/15/2021    DTaP/Tdap/Td vaccine (2 - Td or Tdap) 07/22/2026    Hepatitis C screen  Completed    Hepatitis A vaccine  Aged Out    Hepatitis B vaccine  Aged Out    Hib vaccine  Aged Out    Meningococcal (ACWY) vaccine  Aged Out          Immunization History   Administered Date(s) Administered    COVID-19, PFIZER PURPLE top, DILUTE for use, (age 50 y+), 39mg/0.3mL 08/02/2019, 08/23/2019    Hepatitis B Adult (Engerix-B) 06/18/2016, 07/21/2016, 12/19/2016    Influenza Virus Vaccine 06/18/2016    Influenza, FLUARIX, FLULAVAL, (age 14105mo+) AND AFLURIA, FLUZONE (age 858y+), PF 12/19/2016    MMR 06/27/2016, 07/28/2016    PPD Test 11/10/2016    Tdap (Boostrix, Adacel) 07/21/2016    Varicella (Varivax) 06/27/2016    Zoster Live (Zostavax) 06/27/2016, 07/28/2016         Review of Systems:  Review of Systems   Constitutional:  Negative for activity change, chills and fever.   HENT:  Positive for rhinorrhea. Negative for congestion, sinus pressure, sinus pain and sore throat.    Eyes:  Positive for itching. Negative for pain and discharge.   Respiratory:  Negative for cough and shortness of breath.    Cardiovascular:  Negative for chest pain and palpitations.   Gastrointestinal:  Negative for abdominal pain, nausea and vomiting.   Genitourinary:  Negative for difficulty urinating and dysuria.   Musculoskeletal:  Negative for arthralgias and myalgias.   Skin:  Negative for rash and wound.   Neurological:  Negative for dizziness and syncope.   Psychiatric/Behavioral:  Negative for agitation and behavioral problems.        Physical Exam:   Vitals:    11/15/20 1002 11/15/20 1017   BP: (!) 142/70 134/82   Site: Right Upper Arm    Position: Sitting    Cuff Size: Small Adult    Pulse: 85    Temp: 97 ??F (36.1 ??C)    TempSrc: Temporal    SpO2: 99%    Weight: 137 lb 9.6 oz (62.4 kg)    Height: 5' 1.22" (1.555 m)      Body mass index is 25.81 kg/m??.  Wt Readings from Last 3 Encounters:   11/15/20 137 lb 9.6 oz (62.4 kg)   05/10/18 118 lb (53.5 kg)   07/24/09 120 lb 6.4 oz (54.6 kg)       BP Readings from Last 3 Encounters:   11/15/20 134/82   05/10/18 139/76   07/24/09 110/60       Physical Exam  Constitutional:       Appearance: Normal appearance.   HENT:      Head: Normocephalic and atraumatic.      Nose: Nose normal. No congestion or rhinorrhea.   Eyes:      Extraocular Movements: Extraocular movements intact.      Conjunctiva/sclera: Conjunctivae normal.      Pupils: Pupils are equal, round, and reactive to light.   Cardiovascular:      Rate and Rhythm: Normal rate and regular rhythm.      Heart sounds: Normal heart sounds.      Comments: Left radial pulse weaker than right, but good capillary refill. No neuropathy.  Pulmonary:      Effort: Pulmonary effort is normal.      Breath sounds: Normal breath sounds.   Abdominal:      General: Abdomen is flat. Bowel sounds are normal.      Palpations: Abdomen is soft.      Tenderness: There is no abdominal tenderness. There is no guarding or rebound.   Musculoskeletal:         General: Normal range of motion.   Skin:     General: Skin is warm and dry.      Capillary Refill: Capillary refill takes less than 2 seconds.   Neurological:      General: No focal deficit present.      Mental Status: She is alert and oriented to person, place, and time.   Psychiatric:         Mood and Affect: Mood normal.         Behavior: Behavior normal.            Lab Review:   No visits with results within 2 Month(s) from this visit.   Latest known visit with results is:   Admission on 05/10/2018, Discharged on  05/10/2018   Component Date Value    WBC 05/10/2018 8.0     RBC 05/10/2018 4.46     Hemoglobin 05/10/2018 14.4     Hematocrit 05/10/2018 42.4     MCV 05/10/2018 95.0     MCH 05/10/2018 32.2     MCHC 05/10/2018 33.9     RDW 05/10/2018 13.6     Platelets 05/10/2018 378     MPV 05/10/2018 7.3     Neutrophils % 05/10/2018 61.6     Lymphocytes % 05/10/2018 28.6     Monocytes % 05/10/2018 7.1     Eosinophils % 05/10/2018 1.7     Basophils % 05/10/2018 1.0     Neutrophils Absolute 05/10/2018 5.0     Lymphocytes Absolute 05/10/2018 2.3     Monocytes Absolute 05/10/2018 0.6     Eosinophils Absolute 05/10/2018 0.1     Basophils Absolute 05/10/2018 0.1     Color, UA 05/10/2018 Yellow     Clarity, UA 05/10/2018 Clear     Glucose, Ur 05/10/2018 Negative     Bilirubin Urine 05/10/2018 Negative     Ketones, Urine 05/10/2018 Negative     Specific Gravity, UA 05/10/2018 1.010     Blood, Urine 05/10/2018 MODERATE (A)    pH, UA  05/10/2018 7.0     Protein, UA 05/10/2018 Negative     Urobilinogen, Urine 05/10/2018 0.2     Nitrite, Urine 05/10/2018 Negative     Leukocyte Esterase, Urine 05/10/2018 Negative     Microscopic Examination 05/10/2018 YES     Urine Type 05/10/2018 NotGiven     Rejected Test 05/10/2018 cmp     Reason for Rejection 05/10/2018 see below     Sodium 05/10/2018 127 (A)    Potassium 05/10/2018 5.2 (A)    Chloride 05/10/2018 98 (A)    CO2 05/10/2018 22     Anion Gap 05/10/2018 7     Glucose 05/10/2018 85     BUN 05/10/2018 5 (A)    Creatinine 05/10/2018 0.9     GFR Non-African American 05/10/2018 >60     GFR African American 05/10/2018 >60     Calcium 05/10/2018 7.1 (A)    Total Protein 05/10/2018 5.5 (A)    Albumin 05/10/2018 3.4     Albumin/Globulin Ratio 05/10/2018 1.6     Total Bilirubin 05/10/2018 0.4     Alkaline Phosphatase 05/10/2018 39 (A)    ALT 05/10/2018 11     AST 05/10/2018 25     Globulin 05/10/2018 2.1     HCG(Urine) Pregnancy Test 05/10/2018 Negative     Ventricular Rate 05/10/2018 81     Atrial Rate  05/10/2018 81     P-R Interval 05/10/2018 146     QRS Duration 05/10/2018 78     Q-T Interval 05/10/2018 348     QTc Calculation (Bazett) 05/10/2018 404     P Axis 05/10/2018 84     R Axis 05/10/2018 64     T Axis 05/10/2018 66     Diagnosis 05/10/2018 Normal sinus rhythmNormal ECGNo previous ECGs availableConfirmed by Calla Kicks [phone]) on 05/11/2018 9:43:00 AM     WBC, UA 05/10/2018 3-5     RBC, UA 05/10/2018 11-20 (A)    Epithelial Cells, UA 05/10/2018 2-5     Bacteria, UA 05/10/2018 1+ (A)    Troponin 05/10/2018 <0.01     Pro-BNP 05/10/2018 57     D-Dimer, Quant 05/10/2018 473 (A)    Lipase 05/10/2018 30.0     Troponin 05/10/2018 <0.01           Assessment/Plan:    Susan [name] 55 y.o. female with PMH of allergies, hypertension, chronic hyponatremia, elevated platelet count, possible hyperlipidemia, GERD, vitamin D deficiency, and tobacco use    Diagnoses and all orders for this visit:    Encounter to establish care    Mixed hyperlipidemia  Patient is unsure of whether or not she was diagnosed with hyperlipidemia.  She states that she was having her lipids checked every 3 months.  Records request to primary PCP was filled out today.  -     Lipid Panel; Future  - Continue rosuvastatin 20 mg daily  - Follow-up 3 months    Essential hypertension, well controlled  -     MICROALBUMIN / CREATININE URINE RATIO; Future  - Continue home  olmesartan 5 mg 1.5 tablets daily  - Patient would like to either decrease dose to 1 tab or increase dose to 2 tabs daily because cutting pills in half is difficult and the pill often breaks into multiple pieces.  Recommend keeping blood pressure log.  Proper blood pressure measuring technique was discussed with patient.  We will consider dose adjustment at follow-up based on blood pressure log.  -Follow-up 3 months  Gastroesophageal reflux disease without esophagitis, well controlled  -     Magnesium; Future  - Continue pantoprazole 40 mg daily  - Follow-up as needed for  reflux    Abnormal radial pulse, left weaker than right  -Per patient she has had extensive work-up for this in the past, with no conclusive findings  -Records release from prior PCP filled out today.  -We will discuss in depth at follow-up appointment in 3 months.    Tobacco use, not at goal  - Recommend smoking cessation and discussed the long term health effects of continuing to smoking   - Patient counseled on the different modalities to aid in quitting; such as medication, nicotine patches, gum and lozenges.   - Recommend the CDC and 1-800-quitnow for additional information   - Recommend low-dose lung CT.  We will discuss further at follow-up appointment in 3 months.    Colon cancer screening  -     [facility]FL - Laurell Josephs, MD, Gastroenterology, [place]    Diabetes mellitus screening  -     Hemoglobin A1C; Future    Vitamin D deficiency  Consider discontinuing vitamin D supplement if vitamin D is no longer deficient.  -     Vitamin D 25 Hydroxy; Future    Patient reported history of anemia, hyponatremia, and elevated platelet ct.  -     CBC with Auto Differential; Future  -     Comprehensive Metabolic Panel; Future  Records release from prior PCP filled out today.  Will manage based on lab results.    Healthcare maintenance  -     HIV Screen; Future  -     Hepatitis C Antibody; Future  - Recommend shingles vaccine.  Patient has received Zostavax in the past.  The improved efficacy of Shingrix compared to Zostavax was discussed with the patient.  She will consider getting Shingrix at her local pharmacy.  - Per patient she is up-to-date on cervical cancer screening and  mammogram    Health Maintenance Due:  Health Maintenance Due   Topic Date Due    Pneumococcal 0-64 years Vaccine (1 - PCV) Never done    HIV screen  Never done    Cervical cancer screen  Never done    Colorectal Cancer Screen  Never done    Breast cancer screen  Never done    Shingles vaccine (1 of 2) 10/08/2016    COVID-19 Vaccine (3 - Booster  for Pfizer series) 01/23/2020        Health care decision maker:  <75 years old      Health Maintenance: (USPSTF Recommendations)  (F) Breast Cancer Screen: (40-49 (C), 50-74 biennial screening mammogram (B))  (F) Cervical Cancer Screen: (21-29 q69yrcytology alone; 30-65 q356yrytology alone, q5y17yrth hrHPV alone, or q5yr59yrology+hrHPV (A))  CRC/Colonoscopy Screening: (adults 45-49 (B), 50-75 (A))  Lung Ca Screening: Annual LDCT (+smoker age 37-853-80oked within 15 years, total of 20 pack yr history (B)):  DEXA Screen: (women >65 and older, <65 if at risk/postmenopausal (B))  HIV Screen: (15-(4-65old, and all pregnant patients (A)):  Hep C Screen: (18-79 yr old (B)):  HCC Screen: (all pts with cirrhosis and high risk Hep B (US qKoreamo)):  Immunizations:    RTC:  Return in about 3 months (around 02/15/2021).     EMR Dragon/transcription disclaimer:  Much of this encounter note is electronic transcription/translation of spoken language to printed texts.  The electronic translation of spoken language Bianey Tesoro  be erroneous, or at times, nonsensical words or phrases Brelee Renk be inadvertently transcribed.  Although I have reviewed the note for such errors, some Aarushi Hemric still exist.

## 2020-11-16 LAB — HIV SCREEN
HIV ANTIGEN: NONREACTIVE
HIV Ag/Ab: NONREACTIVE
HIV-1 Antibody: NONREACTIVE
HIV-2 Ab: NONREACTIVE

## 2020-11-16 LAB — HEMOGLOBIN A1C
Hemoglobin A1C: 5.9 %
eAG: 122.6 mg/dL

## 2020-11-27 ENCOUNTER — Encounter

## 2020-11-27 MED ORDER — MOMETASONE FUROATE 50 MCG/ACT NA SUSP
50 MCG/ACT | Freq: Every day | NASAL | 0 refills | Status: DC | PRN
Start: 2020-11-27 — End: 2021-02-20

## 2020-11-27 NOTE — Telephone Encounter (Signed)
mometasone (NASONEX) 50 MCG/ACT nasal spray     [facility] [address], [place] - [address] - P [phone] Carmon Ginsberg [phone]   [address] [place],  [place] [address]   Phone:  [phone]  Fax:  [phone]        Pt requesting refill.         Dr. May put it in the system but did not send it to the pharmacy.      Refill Request       Last Seen: Last Seen Department: 11/15/2020  Last Seen by PCP: 11/15/2020    Last Written:     Next Appointment:   Future Appointments   Date Time Provider Department Center   02/20/2021 10:00 AM Magdelene K May, DO [facility]             Requested Prescriptions      No prescriptions requested or ordered in this encounter

## 2020-11-27 NOTE — Telephone Encounter (Signed)
mometasone (NASONEX) 50 MCG/ACT nasal spray    [facility] [address] [place] - [address] - P [phone] Carmon Ginsberg [phone]   [address] [place], [place] [address]   Phone:  [phone]  Fax:  [phone]      Pt requesting refill.       Dr. May put it in the system but did not send it to the pharmacy.

## 2020-11-30 ENCOUNTER — Telehealth

## 2020-11-30 NOTE — Telephone Encounter (Signed)
mometasone (NASONEX) 50 MCG/ACT nasal spray      [facility] [address], [place] - [address] - P [phone] Carmon Ginsberg [phone]   [address] [place] [address]   Phone:  [phone]  Fax:  [phone]      Pharmacy states that they did not receive this medication. Pt is going out of town tomorrow and need it asap.    Please call pt when sent [phone]

## 2020-11-30 NOTE — Telephone Encounter (Signed)
Rx was sent on 11/27/20.  [facility] given a verbal because they didn't have the Rx.   [phone] (home)  pt informed

## 2021-02-12 NOTE — Progress Notes (Signed)
Patient instructed to:  Bring Picture ID, insurance card, proof of address  Dress Comfortable  No jewelry  No Makeup  No contacts  Shower evening before or morning of surgery with antibacterial soap.  Nothing to eat or drink after midnight the day before surgery.  If instructed by MD to take meds day of surgery, take with only a sip of water.  If taking am beta blocker, take morning of surgery with sip of water per Dr. Ziegler.

## 2021-02-12 NOTE — Progress Notes (Signed)
Obstructive Sleep Apnea (OSA) Screening     Patient:  Susan [name], Hush    Date of Birth: 1966/01/15      Medical Record #:  [id number]                     Date:  02/12/2021     1.  Are you a loud and/or regular snorer?     []   Yes       [x]  No    2.  Have you been observed to gasp or stop breathing during sleep?     []   Yes       [x]  No    3.  Do you feel tired or groggy upon awakening or do you awaken with a headache?           []   Yes       []  No    4.  Are you often tired or fatigued during the wake time hours?     []   Yes       []  No    5.  Do you fall asleep sitting, reading, watching TV or driving?     []   Yes       []  No    6.  Do you often have problems with memory or concentration?     []   Yes       []  No    **If patient's score is ?3 they are considered high risk for OSA.  An Anesthesia provider will evaluate the patient and develop a plan of care the day of surgery.    Note:  If the patient's BMI is more than 35 kg m???? , has neck circumference > 40 cm, and/or high blood pressure the risk is greater (?? American Sleep Apnea Association, 2006).

## 2021-02-18 ENCOUNTER — Encounter

## 2021-02-18 NOTE — Telephone Encounter (Signed)
Subject: Refill Request     Name of Medication? montelukast (SINGULAIR) 10 MG tablet   Patient-reported dosage and instructions? 1 time daily   How many days do you have left? 7  Name of Medication? levocetirizine (XYZAL) 5 MG tablet   Patient-reported dosage and instructions? 1 nightly   How many days do you have left? 6     Name of Medication? azelastine HCl 0.15 % SOLN   Patient-reported dosage and instructions? every   Name of Medication? olmesartan (BENICAR) 5 MG tablet     Name of Medication? hydroCHLOROthiazide (MICROZIDE) 12.5 MG capsule   Patient-reported dosage and instructions? 1 time daily   Name of Medication? rosuvastatin (CRESTOR) 20 MG tablet   Patient-reported dosage and instructions? 1 nightly  Name of Medication? pantoprazole (PROTONIX) 40 MG tablet   Patient-reported dosage and instructions? 1 time daily   Name of Medication? mometasone (NASONEX) 50 MCG/ACT nasal spray     Refill Request       Last Seen: Last Seen Department: 11/15/2020  Last Seen by PCP: 11/15/2020    Last Written: 11/15/2020    Next Appointment:   Future Appointments   Date Time Provider Department Center   02/20/2021 10:00 AM Magdelene K May, DO [facility]   04/11/2021  1:30 PM Magdelene K May, DO [facility]             Requested Prescriptions     Pending Prescriptions Disp Refills    mometasone (NASONEX) 50 MCG/ACT nasal spray 17 g 0     Sig: 2 sprays by Nasal route daily as needed (Take 1 -2 sprays each nostril once daily as needed for ear congestion)    pantoprazole (PROTONIX) 40 MG tablet 90 tablet 1     Sig: Take 1 tablet by mouth daily    rosuvastatin (CRESTOR) 20 MG tablet 90 tablet 1     Sig: TAKE 1 TABLET BY MOUTH ONCE DAILY AT BEDTIME    hydroCHLOROthiazide (MICROZIDE) 12.5 MG capsule 90 capsule 0     Sig: Take 1 capsule by mouth daily    olmesartan (BENICAR) 5 MG tablet 180 tablet 1     Sig: TAKE 1 & 1/2 (ONE & ONE-HALF) TABLETS BY MOUTH ONCE DAILY    azelastine HCl 0.15 % SOLN 0.15 mL 3     Sig: USE 2  SPRAY(S) TWICE DAILY INTRANASALLY AS DIRECTED    levocetirizine (XYZAL) 5 MG tablet 90 tablet 1     Sig: TAKE 1 TABLET BY MOUTH ONCE DAILY IN THE EVENING    montelukast (SINGULAIR) 10 MG tablet 90 tablet 1     Sig: Take 1 tablet by mouth nightly

## 2021-02-19 ENCOUNTER — Inpatient Hospital Stay: Payer: PRIVATE HEALTH INSURANCE

## 2021-02-19 MED ORDER — LIDOCAINE HCL 1 % IJ SOLN 20 ML
1 % | INTRAMUSCULAR | Status: DC | PRN
Start: 2021-02-19 — End: 2021-02-19
  Administered 2021-02-19: 14:00:00 50 via INTRAVENOUS

## 2021-02-19 MED ORDER — LIDOCAINE HCL (PF) 1 % IJ SOLN
1 % | Freq: Once | INTRAMUSCULAR | Status: DC | PRN
Start: 2021-02-19 — End: 2021-02-19

## 2021-02-19 MED ORDER — LACTATED RINGERS IV SOLN
Freq: Once | INTRAVENOUS | Status: AC
Start: 2021-02-19 — End: 2021-02-19
  Administered 2021-02-19: 14:00:00 via INTRAVENOUS

## 2021-02-19 MED ORDER — PROPOFOL 200 MG/20ML IV EMUL
20020 MG/20ML | INTRAVENOUS | Status: DC | PRN
Start: 2021-02-19 — End: 2021-02-19
  Administered 2021-02-19 (×2): 25 via INTRAVENOUS
  Administered 2021-02-19: 14:00:00 50 via INTRAVENOUS
  Administered 2021-02-19: 14:00:00 20 via INTRAVENOUS
  Administered 2021-02-19 (×6): 25 via INTRAVENOUS

## 2021-02-19 NOTE — Anesthesia Post-Procedure Evaluation (Signed)
Department of Anesthesiology  Postprocedure Note    Patient: Susan [name]  MRN: [id number]  Birthdate: 1965/12/29  Date of evaluation: 02/19/2021      Procedure Summary     Date: 02/19/21 Room / Location: [facility] / [place]    Anesthesia Start: 0920 Anesthesia Stop: 0944    Procedure: COLONOSCOPY POLYPECTOMY SNARE/COLD BIOPSY Diagnosis:       Encounter for screening colonoscopy      (Encounter for screening colonoscopy [Z12.11])    Surgeons: Laurell Josephs, MD Responsible Provider: Bayard Males, MD    Anesthesia Type: MAC ASA Status: 2          Anesthesia Type: MAC    Aldrete Phase I: Aldrete Score: 10    Aldrete Phase II: Aldrete Score: 10      Anesthesia Post Evaluation    Patient location during evaluation: PACU  Patient participation: complete - patient participated  Level of consciousness: awake and alert  Airway patency: patent  Nausea & Vomiting: no nausea and no vomiting  Complications: no  Cardiovascular status: hemodynamically stable  Respiratory status: acceptable, room air and spontaneous ventilation  Hydration status: stable  Comments:  --------------------               02/19/21                  1000       --------------------     BP:      (!) 117/59     Pulse:       77         Resp:        15         Temp:                   SpO2:       100%       --------------------

## 2021-02-19 NOTE — Procedures (Signed)
Colonoscopy Procedure  Note          Patient: Susan [name]  DOB: [birth year]  CSN:     Procedure: Colonoscopy with polypectomy (cold snare)    Date:  02/19/2021    Surgeon:  Suzy Bouchard, MD, MD    Referring Provider:  Dr. May    Preoperative Diagnosis:  Screening for colon cancer    Postoperative Diagnosis:  Colon polyps removed    Anesthesia:  Propofol    EBL: <5 mL    Indications: This is a 55 y.o. year old female who presents today with screening for colon cancer.      Procedure:   An informed consent was obtained from the patient after explanation of indications, benefits, possible risks and complications of the procedure.  The patient was then taken to the endoscopy suite, placed in the left lateral decubitus position, and the above IV anesthesia was administered.      With the patient in left lateral decubitus position and a digital rectal examination was performed, no abnormalities noted.  The scope was advanced all the way to the cecum, the mucosa appeared normal.  Appendix was identified.  The terminal ileum was briefly intubated, the mucosa appeared normal.  The mucosa in the ascending, transverse, descending, sigmoid and rectum appeared normal.  In the sigmoid colon a 54mm polyp was cold snared. Four 64mm polyps were removed with a cold biopsy forceps.  Sigmoid diverticuli were seen.  On retroflexion internal hemorrhoids were noted.  The scope was straightened, the colon was decompressed and the scope was withdrawn from the patient.     The patient tolerated the procedure well and was taken to the PACU in good condition.    Impression:  Colon polyps removed    Recommendations:  Await pathology.    Suzy Bouchard, MD, MD   [facility]  02/19/2021

## 2021-02-19 NOTE — Discharge Instructions (Addendum)
PATIENT INSTRUCTIONS  POST-SEDATION    Susan [name]          IMMEDIATELY FOLLOWING PROCEDURE:    Do not drive or operate machinery for the first twenty four hours after surgery.     Do not make any important decisions for twenty four hours after surgery or while taking narcotic pain medications or sedatives.     You should NOT BE LEFT UNATTENDED OR ALONE. A responsible adult should be with you for the rest of the day of your procedure and also during the night for your protection and safety.    You may experience some light headedness. Rest at home with activity as tolerated. You may not need to go to bed, but it is important to rest for the next 24 hours. You should not engage in athletic sports such as basketball, volleyball, jogging, skating, or activities requiring refined motor skills for 24 hours.   If you develop intractable nausea and vomiting or a severe headache please notify your doctor immediately.   You are not expected to have any fever, but if you feel warm, take your temperature. If you have a fever 101 degrees or higher, call your doctor.     If you have had an Endoscopy:   *Eat lightly for your first meal and gradually resume your normal / prescribed diet.    *If you have had a colonoscopy, do not expect a normal bowel movement for approximately three days due to the cleansing of the large intestine prior to colonoscopy.    ONCE YOU ARE HOME, IF YOU SHOULD HAVE:  Difficulty in breathing, persistent nausea or vomiting, bleeding you feel is excessive, or pain that is unusual, increased abdominal bloating, or any swelling, fever / chills, call your physician. If you cannot contact your physician, but feel that your signs and symptoms need a physician's attention, go to the Emergency Department.      FOLLOW-UP:    Please follow up with @PCP @ as scheduled or needed.    Dr. , MD will call you with the biopsy findings.  Call Dr. Suzy Bouchard, MD if there are any GI concerns.  [phone]    Repeat Colonoscopy based on pathology results.    You may be receiving a follow up phone call to ask about your care.         Colonoscopy: What to Expect at Home  Your Recovery  After you have a colonoscopy, you will stay at the clinic for 1 to 2 hours until the medicines wear off. Then you can go home. But you will need to arrange for a ride. Your doctor will tell you when you can eat and do your other usual activities.  Your doctor will talk to you about when you will need your next colonoscopy. Your doctor can help you decide how often you need to be checked. This will depend on the results of your test and your risk for colorectal cancer.  After the test, you may be bloated or have gas pains. You may need to pass gas. If a biopsy was done or a polyp was removed, you may have streaks of blood in your stool (feces) for a few days. Problems such as heavy rectal bleeding may not occur until several weeks after the test. This isn't common. But it can happen after polyps are removed.  This care sheet gives you a general idea about how long it will take for you to recover. But each person recovers  at a different pace. Follow the steps below to get better as quickly as possible.  How can you care for yourself at home?  Activity    Rest when you feel tired.     You can do your normal activities when it feels okay to do so.   Diet    Follow your doctor's directions for eating.     Unless your doctor has told you not to, drink plenty of fluids. This helps to replace the fluids that were lost during the colon prep.     Do not drink alcohol.   Medicines    Your doctor will tell you if and when you can restart your medicines. He or she will also give you instructions about taking any new medicines.     If you take blood thinners, such as warfarin (Coumadin), clopidogrel (Plavix), or aspirin, be sure to talk to your doctor. He or she will tell you if and when to start taking those medicines again. Make sure  that you understand exactly what your doctor wants you to do.     If polyps were removed or a biopsy was done during the test, your doctor may tell you not to take aspirin or other anti-inflammatory medicines for a few days. These include ibuprofen (Advil, Motrin) and naproxen (Aleve).   Other instructions    For your safety, do not drive or operate machinery until the medicine wears off and you can think clearly. Your doctor may tell you not to drive or operate machinery until the day after your test.     Do not sign legal documents or make major decisions until the medicine wears off and you can think clearly. The anesthesia can make it hard for you to fully understand what you are agreeing to.   Follow-up care is a key part of your treatment and safety. Be sure to make and go to all appointments, and call your doctor if you are having problems. It's also a good idea to know your test results and keep a list of the medicines you take.  When should you call for help?  Call 911 anytime you think you may need emergency care. For example, call if:    You passed out (lost consciousness).     You pass maroon or bloody stools.     You have trouble breathing.    Call your doctor now or seek immediate medical care if:    You have pain that does not get better after you take pain medicine.     You are sick to your stomach or cannot drink fluids.     You have new or worse belly pain.     You have blood in your stools.     You have a fever.     You cannot pass stools or gas.    Watch closely for changes in your health, and be sure to contact your doctor if you have any problems.  Where can you learn more?  Go to [email or web address] and sign in to your MyChart account. Enter E264 in the [place] box to learn more about "Colonoscopy: What to Expect at Home."     If you do not have an account, please click on the "Sign Up Now" link.  Current as of: Aug 03, 2015  Content Version: 11.6  ??  2006-2018 [employer]. Care instructions adapted under license by [employer]. If you have questions about a medical condition or this instruction,  always ask your healthcare professional. [employer] disclaims any warranty or liability for your use of this information.

## 2021-02-19 NOTE — Anesthesia Pre-Procedure Evaluation (Signed)
Department of Anesthesiology  Preprocedure Note       Name:  Susan [name]   Age:  55 y.o.  DOB:  February 24, 1966                                          MRN:  [id number]         Date:  02/19/2021      Surgeon: Moishe Spice):  Suzy Bouchard, MD    Procedure: Procedure(s):  COLONOSCOPY    Medications prior to admission:   Prior to Admission medications    Medication Sig Start Date End Date Taking? Authorizing Provider   aspirin 81 MG EC tablet Take 81 mg by mouth daily   Yes Historical Provider, MD   hydroCHLOROthiazide (MICROZIDE) 12.5 MG capsule Take 12.5 mg by mouth daily   Yes Historical Provider, MD   mometasone (NASONEX) 50 MCG/ACT nasal spray 2 sprays by Nasal route daily as needed (Take 1 -2 sprays each nostril once daily as needed for ear congestion)  Patient taking differently: 2 sprays by Nasal route daily 11/27/20 11/27/21  Magdelene K May, DO   EPINEPHrine (EPIPEN) 0.3 MG/0.3ML SOAJ injection USE 1 PEN INTRAMUSCULARLY AR NEEDED ONLY FOR SEVERE REACTION FOR 1 DAY 10/04/20   Historical Provider, MD   azelastine HCl 0.15 % SOLN USE 2 SPRAY(S) TWICE DAILY INTRANASALLY AS DIRECTED 11/15/20   Magdelene K May, DO   levocetirizine (XYZAL) 5 MG tablet TAKE 1 TABLET BY MOUTH ONCE DAILY IN THE EVENING 11/15/20   Magdelene K May, DO   montelukast (SINGULAIR) 10 MG tablet Take 1 tablet by mouth nightly 11/15/20   Magdelene K May, DO   olmesartan (BENICAR) 5 MG tablet TAKE 1 & 1/2 (ONE & ONE-HALF) TABLETS BY MOUTH ONCE DAILY 11/15/20   Magdelene K May, DO   pantoprazole (PROTONIX) 40 MG tablet Take 1 tablet by mouth daily 11/15/20   Magdelene K May, DO   rosuvastatin (CRESTOR) 20 MG tablet TAKE 1 TABLET BY MOUTH ONCE DAILY AT BEDTIME 11/15/20   Magdelene K May, DO       Current medications:    Current Facility-Administered Medications   Medication Dose Route Frequency Provider Last Rate Last Admin   ??? lidocaine PF 1 % injection 0.1 mL  0.1 mL IntraDERmal Once PRN Suzy Bouchard, MD           Allergies:    Allergies   Allergen Reactions    ??? Food Hives     Sharp cheeses, high mold content food, some nuts   ??? Prednazoline Headaches     Prednisones Makes bp rise    ??? Cipro Xr Other (See Comments)     Muscle pain   ??? Sulfa Antibiotics Hives       Problem List:  There is no problem list on file for this patient.      Past Medical History:        Diagnosis Date   ??? Arthritis    ??? GERD (gastroesophageal reflux disease)    ??? Hiatal hernia    ??? Hypertension    ??? Thyroid disease     cysts       Past Surgical History:        Procedure Laterality Date   ??? COLONOSCOPY      2011   ??? ESOPHAGOGASTRODUODENOSCOPY         Social History:  Social History     Tobacco Use   ??? Smoking status: Every Day     Packs/day: 0.50     Types: Cigarettes     Start date: 48   ??? Smokeless tobacco: Never   Substance Use Topics   ??? Alcohol use: No                                Ready to quit: Not Answered  Counseling given: Not Answered      Vital Signs (Current):   Vitals:    02/12/21 1602 02/19/21 0835   BP:  137/77   Pulse:  93   Resp:  20   Temp:  97.3 ??F (36.3 ??C)   TempSrc:  Oral   SpO2:  95%   Weight: 130 lb (59 kg)    Height: 5' 2.5" (1.588 m)                                               BP Readings from Last 3 Encounters:   02/19/21 137/77   11/15/20 134/82   05/10/18 139/76       NPO Status: Time of last liquid consumption: 0350                        Time of last solid consumption: 1900                        Date of last liquid consumption: 02/19/21                        Date of last solid food consumption: 02/17/21    BMI:   Wt Readings from Last 3 Encounters:   02/12/21 130 lb (59 kg)   11/15/20 137 lb 9.6 oz (62.4 kg)   05/10/18 118 lb (53.5 kg)     Body mass index is 23.4 kg/m??.    CBC:   Lab Results   Component Value Date/Time    WBC 7.6 11/15/2020 11:30 AM    RBC 4.43 11/15/2020 11:30 AM    HGB 14.3 11/15/2020 11:30 AM    HCT 42.3 11/15/2020 11:30 AM    MCV 95.5 11/15/2020 11:30 AM    RDW 14.3 11/15/2020 11:30 AM    PLT 436 11/15/2020 11:30 AM       CMP:   Lab  Results   Component Value Date/Time    NA 132 11/15/2020 11:30 AM    K 4.4 11/15/2020 11:30 AM    CL 94 11/15/2020 11:30 AM    CO2 28 11/15/2020 11:30 AM    BUN 5 11/15/2020 11:30 AM    CREATININE 0.7 11/15/2020 11:30 AM    GFRAA >60 11/15/2020 11:30 AM    GFRAA >60 05/09/2010 04:45 PM    AGRATIO 1.8 11/15/2020 11:30 AM    LABGLOM >60 11/15/2020 11:30 AM    GLUCOSE 85 11/15/2020 11:30 AM    PROT 7.4 11/15/2020 11:30 AM    PROT 7.3 05/09/2010 04:45 PM    CALCIUM 9.6 11/15/2020 11:30 AM    BILITOT 0.3 11/15/2020 11:30 AM    ALKPHOS 53 11/15/2020 11:30 AM    AST 21 11/15/2020 11:30 AM    ALT 15 11/15/2020 11:30 AM  POC Tests: No results for input(s): POCGLU, POCNA, POCK, POCCL, POCBUN, POCHEMO, POCHCT in the last 72 hours.    Coags: No results found for: PROTIME, INR, APTT    HCG (If Applicable):   Lab Results   Component Value Date    PREGTESTUR Negative 05/10/2018        ABGs: No results found for: PHART, PO2ART, PCO2ART, HCO3ART, BEART, O2SATART     Type & Screen (If Applicable):  No results found for: LABABO, LABRH    Drug/Infectious Status (If Applicable):  No results found for: HIV, HEPCAB    COVID-19 Screening (If Applicable): No results found for: COVID19        Anesthesia Evaluation  Patient summary reviewed no history of anesthetic complications:   Airway: Mallampati: II  TM distance: >3 FB   Neck ROM: full  Mouth opening: > = 3 FB   Dental: normal exam         Pulmonary:Negative Pulmonary ROS and normal exam  breath sounds clear to auscultation                             Cardiovascular:    (+) hypertension:,         Rhythm: regular  Rate: normal                    Neuro/Psych:   Negative Neuro/Psych ROS              GI/Hepatic/Renal:   (+) hiatal hernia, GERD:,           Endo/Other: Negative Endo/Other ROS                    Abdominal:             Vascular: negative vascular ROS.         Other Findings:           Anesthesia Plan      general     ASA 2       Induction: intravenous.      Anesthetic plan  and risks discussed with patient.      Plan discussed with CRNA.    Attending anesthesiologist reviewed and agrees with Preprocedure content                Anastasio Champion, MD   02/19/2021

## 2021-02-19 NOTE — H&P (Signed)
Gastroenterology Note             Pre-operative History and Physical    Patient: Susan [name]  DOB: Aug 20, 1965  CSN:     History Obtained From:  patient and/or guardian.     HISTORY OF PRESENT ILLNESS:    The patient is a 55 y.o. female  here for colonoscopy.     Past Medical History:    Past Medical History:   Diagnosis Date    Arthritis     GERD (gastroesophageal reflux disease)     Hiatal hernia     Hypertension     Thyroid disease     cysts     Past Surgical History:    Past Surgical History:   Procedure Laterality Date    COLONOSCOPY      2011    ESOPHAGOGASTRODUODENOSCOPY       Medications Prior to Admission:   No current facility-administered medications on file prior to encounter.     Current Outpatient Medications on File Prior to Encounter   Medication Sig Dispense Refill    aspirin 81 MG EC tablet Take 81 mg by mouth daily      hydroCHLOROthiazide (MICROZIDE) 12.5 MG capsule Take 12.5 mg by mouth daily      mometasone (NASONEX) 50 MCG/ACT nasal spray 2 sprays by Nasal route daily as needed (Take 1 -2 sprays each nostril once daily as needed for ear congestion) (Patient taking differently: 2 sprays by Nasal route daily) 17 g 0    EPINEPHrine (EPIPEN) 0.3 MG/0.3ML SOAJ injection USE 1 PEN INTRAMUSCULARLY AR NEEDED ONLY FOR SEVERE REACTION FOR 1 DAY      azelastine HCl 0.15 % SOLN USE 2 SPRAY(S) TWICE DAILY INTRANASALLY AS DIRECTED 0.15 mL 3    levocetirizine (XYZAL) 5 MG tablet TAKE 1 TABLET BY MOUTH ONCE DAILY IN THE EVENING 90 tablet 1    montelukast (SINGULAIR) 10 MG tablet Take 1 tablet by mouth nightly 90 tablet 1    olmesartan (BENICAR) 5 MG tablet TAKE 1 & 1/2 (ONE & ONE-HALF) TABLETS BY MOUTH ONCE DAILY 180 tablet 1    pantoprazole (PROTONIX) 40 MG tablet Take 1 tablet by mouth daily 90 tablet 1    rosuvastatin (CRESTOR) 20 MG tablet TAKE 1 TABLET BY MOUTH ONCE DAILY AT BEDTIME 90 tablet 1        Allergies:  Food, Prednazoline, Cipro xr, and Sulfa antibiotics      Social History:   Social  History     Tobacco Use    Smoking status: Every Day     Packs/day: 0.50     Types: Cigarettes     Start date: 1986    Smokeless tobacco: Never   Substance Use Topics    Alcohol use: No     Family History:   Family History   Problem Relation Age of Onset    Diabetes Mother         borderline    Kidney Disease Father     Dementia Father        PHYSICAL EXAM:      BP 137/77    Pulse 93    Temp 97.3 ??F (36.3 ??C) (Oral)    Resp 20    Ht 5' 2.5" (1.588 m)    Wt 130 lb (59 kg)    LMP 07/19/2016    SpO2 95%    BMI 23.40 kg/m??  I        Heart:   RRR,  normal s1s2    Lungs:  CTA bilat,  Normal effort    Abdomen:   NT, ND      ASA Grade:  ASA 2 - Patient with mild systemic disease with no functional limitations    Mallampati Class: 2          ASSESSMENT AND PLAN:    1.  Patient is a 55 y.o. female here for Colonoscopy with MAC.   2.  Procedure options, risks and benefits reviewed with patient.  Patient expresses understanding.    Laurell Josephs, MD,   [place]  02/19/2021

## 2021-02-20 ENCOUNTER — Encounter: Payer: PRIVATE HEALTH INSURANCE | Attending: Family Medicine | Primary: Family Medicine

## 2021-02-20 DIAGNOSIS — Z23 Encounter for immunization: Secondary | ICD-10-CM

## 2021-02-20 MED ORDER — ROSUVASTATIN CALCIUM 20 MG PO TABS
20 MG | ORAL_TABLET | ORAL | 1 refills | Status: DC
Start: 2021-02-20 — End: 2021-08-20

## 2021-02-20 MED ORDER — PANTOPRAZOLE SODIUM 40 MG PO TBEC
40 MG | ORAL_TABLET | Freq: Every day | ORAL | 1 refills | Status: DC
Start: 2021-02-20 — End: 2021-08-20

## 2021-02-20 MED ORDER — MONTELUKAST SODIUM 10 MG PO TABS
10 MG | ORAL_TABLET | Freq: Every evening | ORAL | 1 refills | Status: DC
Start: 2021-02-20 — End: 2021-08-20

## 2021-02-20 MED ORDER — AZELASTINE HCL 0.15 % NA SOLN
0.15 % | NASAL | 3 refills | Status: DC
Start: 2021-02-20 — End: 2021-04-11

## 2021-02-20 MED ORDER — LEVOCETIRIZINE DIHYDROCHLORIDE 5 MG PO TABS
5 MG | ORAL_TABLET | ORAL | 1 refills | Status: DC
Start: 2021-02-20 — End: 2021-08-20

## 2021-02-20 MED ORDER — HYDROCHLOROTHIAZIDE 12.5 MG PO CAPS
12.5 MG | ORAL_CAPSULE | Freq: Every day | ORAL | 0 refills | Status: DC
Start: 2021-02-20 — End: 2021-04-11

## 2021-02-20 MED ORDER — MOMETASONE FUROATE 50 MCG/ACT NA SUSP
50 MCG/ACT | Freq: Every day | NASAL | 3 refills | Status: DC
Start: 2021-02-20 — End: 2021-04-11

## 2021-02-20 MED ORDER — OLMESARTAN MEDOXOMIL 5 MG PO TABS
5 MG | ORAL_TABLET | ORAL | 1 refills | Status: DC
Start: 2021-02-20 — End: 2021-04-12

## 2021-04-11 ENCOUNTER — Inpatient Hospital Stay: Payer: PRIVATE HEALTH INSURANCE | Primary: Family Medicine

## 2021-04-11 ENCOUNTER — Ambulatory Visit
Admit: 2021-04-11 | Discharge: 2021-04-11 | Payer: PRIVATE HEALTH INSURANCE | Attending: Family Medicine | Primary: Family Medicine

## 2021-04-11 DIAGNOSIS — E871 Hypo-osmolality and hyponatremia: Secondary | ICD-10-CM

## 2021-04-11 DIAGNOSIS — J302 Other seasonal allergic rhinitis: Secondary | ICD-10-CM

## 2021-04-11 MED ORDER — MOMETASONE FUROATE 50 MCG/ACT NA SUSP
50 MCG/ACT | Freq: Every day | NASAL | 3 refills | Status: AC
Start: 2021-04-11 — End: 2022-04-11

## 2021-04-11 MED ORDER — HYDROCHLOROTHIAZIDE 12.5 MG PO CAPS
12.5 MG | ORAL_CAPSULE | Freq: Every day | ORAL | 3 refills | Status: AC
Start: 2021-04-11 — End: 2021-09-19

## 2021-04-11 MED ORDER — AZELASTINE HCL 0.15 % NA SOLN
0.15 % | Freq: Every day | NASAL | 3 refills | Status: AC
Start: 2021-04-11 — End: 2021-10-24

## 2021-04-11 MED ORDER — OLOPATADINE HCL 0.1 % OP SOLN
0.1 % | Freq: Two times a day (BID) | OPHTHALMIC | 3 refills | Status: AC
Start: 2021-04-11 — End: 2021-05-11

## 2021-04-11 MED ORDER — DRY EYE RELIEF DROPS 0.2-0.2-1 % OP SOLN
OPHTHALMIC | 3 refills | Status: AC | PRN
Start: 2021-04-11 — End: 2021-10-24

## 2021-04-11 NOTE — Assessment & Plan Note (Addendum)
Borderline controlled, continue current medications   - Pataday prescribed today  - Patient has seen ENT in the past and a surgical procedure was recommended patient defered. She does not wish to establish with a new ENT  - f/u 2 weeks

## 2021-04-11 NOTE — Progress Notes (Signed)
[facility]                                             [address] [place], [place] [address]        Phone: [phone]      Name:  Susan [name]  DOB:    1965-04-02    Consultants:   Patient Care Team:  Darlin Drop Kenzlie Disch, DO as PCP - General (Family Medicine)  Laurell Josephs, MD as Consulting Physician (Gastroenterology)    Chief Complaint:     Susan [name] is a 56 y.o. female  who presents today for an established patient care visit with Personalized Prevention Plan Services as noted below.    HPI:     Susan [name] 56 y.o. female has Seasonal allergies; Mixed hyperlipidemia; Essential hypertension; Chronic hyponatremia; Family history of elevated lipoprotein (a); Sinusitis; ETD (eustachian tube dysfunction); Gastroesophageal reflux disease; Dry eyes; Prediabetes; Weight gain; and Tobacco use on their problem list.     Recent weight gain  She has been eating less and exercising more but has gained weight. Walks dog multiple times a day. She has been focusing on eating fruits and vegetables. No bradycardia, fatigue, dry skin, constipation.  Wt Readings from Last 3 Encounters:   04/11/21 143 lb 12.8 oz (65.2 kg)   02/12/21 130 lb (59 kg)   11/15/20 137 lb 9.6 oz (62.4 kg)        Seasonal allergies  She continues to have symptoms despite taking the following medications. Main complaint of sinus pressure. She has had allergy shots in the past and it did not work. Patient tolerating medication(s) well without adverse effects.     mometasone (NASONEX) 50 MCG/ACT nasal spray, 2 sprays by Nasal route daily, Disp: 1 each, Rfl: 3    azelastine HCl 0.15 % SOLN, USE 2 SPRAY(S) TWICE DAILY INTRANASALLY AS DIRECTED, Disp: 0.15 mL, Rfl: 3    montelukast (SINGULAIR) 10 MG tablet, Take 1 tablet by mouth nightly, Disp: 90 tablet, Rfl: 1    Levocetirizine 5 mg daily    HLD  Genetic factor for increased  lipoprotein a  Patient tolerating medication(s) well without adverse effects.     rosuvastatin (CRESTOR) 20 MG tablet, TAKE 1 TABLET BY MOUTH ONCE DAILY AT BEDTIME, Disp: 90 tablet, Rfl: 1  Lab Results   Component Value Date    CHOL 163 11/15/2020    TRIG 77 11/15/2020    HDL 55 11/15/2020    LDLCALC 93 11/15/2020    LABVLDL 15 11/15/2020       GERD, hiatal hernia  Patient tolerating medication(s) well without adverse effects.   Patient tolerating medication(s) well without adverse effects. She has been having reflux symptoms twice a weeks. Everything gives her heartburn. She has a hx of hiatal hernia.     pantoprazole (PROTONIX) 40 MG tablet, Take 1 tablet by mouth daily, Disp: 90 tablet, Rfl: 1    HTN  Patient denies any exertional chest pain, dyspnea, palpitations, syncope, orthopnea, edema or paroxysmal nocturnal dyspnea. Patient tolerating medication(s) well without adverse effects.     hydroCHLOROthiazide (MICROZIDE) 12.5 MG capsule, Take  1 capsule by mouth daily, Disp: 90 capsule, Rfl: 0    olmesartan (BENICAR) 5 MG tablet, TAKE 1 & 1/2 (ONE & ONE-HALF) TABLETS BY MOUTH ONCE DAILY, Disp: 180 tablet, Rfl: 1  Lab Results   Component Value Date    LABMICR <1.20 11/15/2020          Was started on aspirin after getting COVID    aspirin 81 MG EC tablet, Take 81 mg by mouth daily, Disp: , Rfl:       Colonoscopy 02/19/2021, hyperplastic polyp, 10 year follow up    Tobacco use  Patient is at the preparation (Acknowledges the subject with an intent to take action) stage today.    She would like to try Chantix.   She is going on cruise in 2 days.     Prediabetes  Hemoglobin A1C   Date Value Ref Range Status   11/15/2020 5.9 See comment % Final     Comment:     Comment:  Diagnosis of Diabetes: > or = 6.5%  Increased risk of diabetes (Prediabetes): 5.7-6.4%  Glycemic Control: Nonpregnant Adults: <7.0%                    Pregnant: <6.0%                Past Medical History:    Past Medical History:   Diagnosis Date     Arthritis     GERD (gastroesophageal reflux disease)     Hiatal hernia     Hypertension     Thyroid disease     cysts       Past Surgical History:  Past Surgical History:   Procedure Laterality Date    COLONOSCOPY      2011    COLONOSCOPY N/A 02/19/2021    COLONOSCOPY POLYPECTOMY SNARE/COLD BIOPSY performed by Laurell Josephs, MD at [facility]:  Prior to Visit Medications    Medication Sig Taking? Authorizing Provider   olmesartan (BENICAR) 5 MG tablet Take 1 tablet by mouth daily TAKE 1 & 1/2 (ONE & ONE-HALF) TABLETS BY MOUTH ONCE DAILY Yes Jeoffrey Eleazer K Osie Merkin, DO   azelastine HCl 0.15 % SOLN 1 spray by Each Nostril route daily USE 2 SPRAY(S) TWICE DAILY INTRANASALLY AS DIRECTED Yes Shaun Zuccaro K Rhiannan Kievit, DO   mometasone (NASONEX) 50 MCG/ACT nasal spray 2 sprays by Nasal route daily Yes Lorie Melichar K Susanna Benge, DO   hydroCHLOROthiazide (MICROZIDE) 12.5 MG capsule Take 1 capsule by mouth daily Yes Vicente Weidler K Joeleen Wortley, DO   olopatadine (PATANOL) 0.1 % ophthalmic solution Place 1 drop into both eyes 2 times daily Yes Rita Prom K Angelos Wasco, DO   glycerin-hypromellose-PEG 400 (DRY EYE RELIEF DROPS) 0.2-0.2-1 % SOLN opthalmic solution Place 1 drop into both eyes as needed (as needed for dry eyes) Yes Filbert Craze K Allina Riches, DO   pantoprazole (PROTONIX) 40 MG tablet Take 1 tablet by mouth daily Yes Kaisha Wachob K Sheniah Supak, DO   rosuvastatin (CRESTOR) 20 MG tablet TAKE 1 TABLET BY MOUTH ONCE DAILY AT BEDTIME Yes Kristen Fromm K Glennie Rodda, DO   levocetirizine (XYZAL) 5 MG tablet TAKE 1 TABLET BY MOUTH ONCE DAILY IN THE EVENING Yes Jaloni Davoli K Jaleel Allen, DO   montelukast (SINGULAIR) 10 MG tablet Take 1 tablet by mouth nightly Yes Bence Trapp K Brando Taves, DO   aspirin 81 MG EC tablet Take 81 mg by mouth daily Yes Historical Provider, MD   EPINEPHrine (EPIPEN) 0.3  MG/0.3ML SOAJ injection USE 1 PEN INTRAMUSCULARLY AR NEEDED ONLY FOR SEVERE REACTION FOR 1 DAY Yes Historical Provider, MD       Allergies:    Food, Prednazoline, Cipro xr, and  Sulfa antibiotics    Family History:       Problem Relation Age of Onset    Diabetes Mother         borderline    Kidney Disease Father     Dementia Father          Health Maintenance Completed:  Health Maintenance   Topic Date Due    Pneumococcal 0-64 years Vaccine (1 - PCV) Never done    Cervical cancer screen  Never done    Shingles vaccine (1 of 2) 10/08/2016    COVID-19 Vaccine (3 - Booster for Pfizer series) 10/18/2019    Breast cancer screen  04/20/2020    A1C test (Diabetic or Prediabetic)  11/15/2021    Lipids  11/15/2021    Depression Screen  04/11/2022    DTaP/Tdap/Td vaccine (2 - Td or Tdap) 07/22/2026    Colorectal Cancer Screen  02/20/2031    Flu vaccine  Completed    Hepatitis C screen  Completed    HIV screen  Completed    Hepatitis A vaccine  Aged Out    Hib vaccine  Aged Out    Meningococcal (ACWY) vaccine  Aged Out          Immunization History   Administered Date(s) Administered    COVID-19, PFIZER PURPLE top, DILUTE for use, (age 7 y+), 75mg/0.3mL 08/02/2019, 08/23/2019    Hepatitis B Adult (Engerix-B) 06/18/2016, 07/21/2016, 12/19/2016    Influenza Virus Vaccine 06/18/2016    Influenza, FLUARIX, FLULAVAL, FChristianne Dolin(age 281mo+) AND AFLURIA, (age 1139y+), PF, 0.545m09/28/2018    MMR 06/27/2016, 07/28/2016    PPD Test 11/10/2016    Tdap (Boostrix, Adacel) 07/21/2016    Varicella (Varivax) 06/27/2016    Zoster Live (Zostavax) 06/27/2016, 07/28/2016         Review of Systems:  Review of Systems   Constitutional:  Negative for activity change, chills and fever.   HENT:  Positive for congestion and sinus pressure. Negative for rhinorrhea and sore throat.    Eyes:  Positive for redness and itching. Negative for pain and discharge.   Respiratory:  Negative for cough and shortness of breath.    Cardiovascular:  Negative for chest pain and palpitations.   Gastrointestinal:  Negative for abdominal pain, nausea and vomiting.   Genitourinary:  Negative for difficulty urinating and dysuria.   Musculoskeletal:   Negative for arthralgias and myalgias.   Skin:  Negative for rash and wound.   Neurological:  Negative for dizziness and syncope.   Psychiatric/Behavioral:  Negative for agitation and behavioral problems.       Physical Exam:   Vitals:    04/11/21 1328   BP: 128/74   Pulse: 90   Temp: 97.3 ??F (36.3 ??C)   SpO2: 98%   Weight: 143 lb 12.8 oz (65.2 kg)   Height: 5' 2.5" (1.588 m)     Body mass index is 25.88 kg/m??.    Wt Readings from Last 3 Encounters:   04/11/21 143 lb 12.8 oz (65.2 kg)   02/12/21 130 lb (59 kg)   11/15/20 137 lb 9.6 oz (62.4 kg)       BP Readings from Last 3 Encounters:   04/11/21 128/74   02/19/21 (!) 117/59   11/15/20 134/82  Physical Exam  Constitutional:       Appearance: Normal appearance.   HENT:      Head: Normocephalic and atraumatic.      Nose: Nose normal.   Eyes:      Extraocular Movements: Extraocular movements intact.      Conjunctiva/sclera: Conjunctivae normal.      Pupils: Pupils are equal, round, and reactive to light.   Cardiovascular:      Rate and Rhythm: Normal rate and regular rhythm.      Pulses: Normal pulses.      Heart sounds: Normal heart sounds.   Pulmonary:      Effort: Pulmonary effort is normal.      Breath sounds: Normal breath sounds.   Musculoskeletal:         General: Normal range of motion.   Skin:     General: Skin is warm and dry.   Neurological:      General: No focal deficit present.      Mental Status: She is alert and oriented to person, place, and time.   Psychiatric:         Mood and Affect: Mood normal.         Behavior: Behavior normal.            Lab Review: Pertinent labs reviewed        Assessment/Plan:  Susan [name] 56 y.o. female has Seasonal allergies; Mixed hyperlipidemia; Essential hypertension; Chronic hyponatremia; Family history of elevated lipoprotein (a); Sinusitis; ETD (eustachian tube dysfunction); Gastroesophageal reflux disease; Dry eyes; Prediabetes; Weight gain; and Tobacco use on their problem list.   Problem List           Circulatory    Essential hypertension      Well-controlled, continue current medications   - f/u every 6 mo         Relevant Medications    hydroCHLOROthiazide (MICROZIDE) 12.5 MG capsule    olmesartan (BENICAR) 5 MG tablet       Digestive    Gastroesophageal reflux disease      Uncontrolled, continue current medications   Likely exacerbated by hiatal hernia  - Consider starting H2 blocker  - Discuss further at follow up  - Refer to general surgery- patient was referred to general surgery in the past but has moved  - f/u 2 weeks         Relevant Medications    pantoprazole (PROTONIX) 40 MG tablet    Other Relevant Orders    [place] - Ward, Shanon Brow, MD, General Surgery, [place]       Other    Seasonal allergies - Primary      Borderline controlled, continue current medications   - Pataday prescribed today  - Patient has seen ENT in the past and a surgical procedure was recommended patient defered. She does not wish to establish with a new ENT  - f/u 2 weeks          Relevant Medications    azelastine HCl 0.15 % SOLN    mometasone (NASONEX) 50 MCG/ACT nasal spray    olopatadine (PATANOL) 0.1 % ophthalmic solution    Mixed hyperlipidemia      Well-controlled, continue current medications   - annual lipid panel         Relevant Medications    rosuvastatin (CRESTOR) 20 MG tablet    Chronic hyponatremia      Unclear control, continue current medications   - Consider work up for SIADH pending  CMP result  - Consider d/c HCTZ  - f/u 2 weeks         Relevant Orders    Comprehensive Metabolic Panel (Completed)    Family history of elevated lipoprotein (a)      Well-controlled, continue current medications   - Annual lipid panel           Dry eyes      Borderline controlled, continue current medications   - Consider establishing with opthalmology/ optometry if not improved   - f/u 2 weeks         Relevant Medications    glycerin-hypromellose-PEG 400 (DRY EYE RELIEF DROPS) 0.2-0.2-1 % SOLN opthalmic solution    Prediabetes       Borderline controlled, lifestyle modifications recommended   - Annual A1C         Weight gain      Unclear control, lifestyle modifications recommended   - Likely related to calorie intake and energy expenditure  -Recommend 150 minutes of cardiovascular activity a week, moderate intensity  -Increase  intake of fruits and vegetables  -Minimize packaged foods  - Recommend that patient take note of activity level and diet, if continued weight gain consider checking TSH  - f/u 2 weeks           Tobacco use      Uncontrolled, contemplative stage   - Recommend addressing after her vacation  - Patient is interested in Chantix  - f/u 2 weeks            Recommend discontinuing aspirin     Patient declined all vaccines today      Health Maintenance Due:  Health Maintenance Due   Topic Date Due    Pneumococcal 0-64 years Vaccine (1 - PCV) Never done    Cervical cancer screen  Never done    Shingles vaccine (1 of 2) 10/08/2016    COVID-19 Vaccine (3 - Booster for Pfizer series) 10/18/2019    Breast cancer screen  04/20/2020        RTC:  Return in about 2 weeks (around 04/25/2021) for Smoking cessation.     EMR Dragon/transcription disclaimer:  Much of this encounter note is electronic transcription/translation of spoken language to printed texts.  The electronic translation of spoken language Nilani Hugill be erroneous, or at times, nonsensical words or phrases Charlii Yost be inadvertently transcribed.  Although I have reviewed the note for such errors, some Lacorey Brusca still exist.

## 2021-04-11 NOTE — Assessment & Plan Note (Signed)
Uncontrolled, continue current medications   Likely exacerbated by hiatal hernia  - Consider starting H2 blocker  - Discuss further at follow up  - Refer to general surgery- patient was referred to general surgery in the past but has moved  - f/u 2 weeks

## 2021-04-11 NOTE — Assessment & Plan Note (Signed)
Borderline controlled, lifestyle modifications recommended   - Annual A1C

## 2021-04-11 NOTE — Assessment & Plan Note (Signed)
Unclear control, lifestyle modifications recommended   - Likely related to calorie intake and energy expenditure  -Recommend 150 minutes of cardiovascular activity a week, moderate intensity  -Increase  intake of fruits and vegetables  -Minimize packaged foods  - Recommend that patient take note of activity level and diet, if continued weight gain consider checking TSH  - f/u 2 weeks

## 2021-04-11 NOTE — Assessment & Plan Note (Signed)
Uncontrolled, contemplative stage   - Recommend addressing after her vacation  - Patient is interested in Chantix  - f/u 2 weeks

## 2021-04-11 NOTE — Assessment & Plan Note (Signed)
Borderline controlled, continue current medications   - Consider establishing with opthalmology/ optometry if not improved   - f/u 2 weeks

## 2021-04-11 NOTE — Assessment & Plan Note (Signed)
Well-controlled, continue current medications   - f/u every 6 mo

## 2021-04-12 LAB — COMPREHENSIVE METABOLIC PANEL
ALT: 10 U/L (ref 10–40)
AST: 16 U/L (ref 15–37)
Albumin/Globulin Ratio: 2 (ref 1.1–2.2)
Albumin: 4.3 g/dL (ref 3.4–5.0)
Alkaline Phosphatase: 53 U/L (ref 40–129)
Anion Gap: 13 (ref 3–16)
BUN: 7 mg/dL (ref 7–20)
CO2: 26 mmol/L (ref 21–32)
Calcium: 9.6 mg/dL (ref 8.3–10.6)
Chloride: 93 mmol/L — ABNORMAL LOW (ref 99–110)
Creatinine: 0.7 mg/dL (ref 0.6–1.1)
Est, Glom Filt Rate: >60 (ref >60)
Glucose: 85 mg/dL (ref 70–99)
Potassium: 4.6 mmol/L (ref 3.5–5.1)
Sodium: 132 mmol/L — ABNORMAL LOW (ref 136–145)
Total Bilirubin: 0.3 mg/dL (ref 0.0–1.0)
Total Protein: 6.5 g/dL (ref 6.4–8.2)

## 2021-04-12 MED ORDER — OLMESARTAN MEDOXOMIL 5 MG PO TABS
5 MG | ORAL_TABLET | Freq: Every day | ORAL | 3 refills | Status: AC
Start: 2021-04-12 — End: 2021-09-19

## 2021-04-12 NOTE — Assessment & Plan Note (Signed)
Unclear control, continue current medications   - Consider work up for SIADH pending CMP result  - Consider d/c HCTZ  - f/u 2 weeks

## 2021-04-12 NOTE — Assessment & Plan Note (Signed)
Well-controlled, continue current medications   - Annual lipid panel

## 2021-04-12 NOTE — Telephone Encounter (Signed)
olmesartan (BENICAR) 5 MG tablet  Dispense Quantity: 90 tablet    Sig: Take 1 tablet by mouth daily TAKE 1 & 1/2 (ONE & ONE-HALF) TABLETS BY MOUTH ONCE DAILY      Pharmacy is calling to clarify this Rx.     Per Dr. May:   Needs to be like her previous Rx from 02/20/21.     olmesartan (BENICAR) 5 MG tablet  Dispense Quantity: 180 tablet           Sig: TAKE 1 & 1/2 (ONE & ONE-HALF) TABLETS BY MOUTH ONCE DAILY       Verbal given to pharmacy per Dr. Janae Bridgeman

## 2021-04-12 NOTE — Assessment & Plan Note (Signed)
Well-controlled, continue current medications   - annual lipid panel

## 2021-04-25 ENCOUNTER — Encounter: Payer: BLUE CROSS/BLUE SHIELD | Attending: Family Medicine | Primary: Family Medicine

## 2021-05-02 ENCOUNTER — Encounter: Payer: BLUE CROSS/BLUE SHIELD | Attending: Family Medicine | Primary: Family Medicine

## 2021-06-21 ENCOUNTER — Ambulatory Visit
Admit: 2021-06-21 | Discharge: 2021-06-21 | Payer: PRIVATE HEALTH INSURANCE | Attending: Family Medicine | Primary: Family Medicine

## 2021-06-21 DIAGNOSIS — R0981 Nasal congestion: Secondary | ICD-10-CM

## 2021-06-21 MED ORDER — BUPROPION HCL ER (SR) 150 MG PO TB12
150 MG | ORAL_TABLET | Freq: Two times a day (BID) | ORAL | 3 refills | Status: DC
Start: 2021-06-21 — End: 2021-09-30

## 2021-06-21 NOTE — Assessment & Plan Note (Signed)
Uncontrolled, continue current medications   - ENT referral  - Recommend saline sinus irrigation as tolerated

## 2021-06-21 NOTE — Progress Notes (Signed)
[facility]                                             [address] [place], [place] [address]        Phone: [phone]      Name:  Susan [name]  DOB:    01-Jul-1965    Consultants:   Patient Care Team:  Darlin Drop Bernell Haynie, DO as PCP - General (Family Medicine)  Laurell Josephs, MD as Consulting Physician (Gastroenterology)    Chief Complaint:     Susan [name] is a 56 y.o. female  who presents today for an established patient care visit with Personalized Prevention Plan Services as noted below.    HPI:     Susan [name] 56 y.o. female has Seasonal allergies; Mixed hyperlipidemia; Essential hypertension; Chronic hyponatremia; Family history of elevated lipoprotein (a); ETD (eustachian tube dysfunction); Gastroesophageal reflux disease; Dry eyes; Prediabetes; Weight gain; Tobacco use; and Sinus congestion on their problem list.      Tobacco use  Patient currently smoking about 0.5 pk/ day. Patient is at the preparation (Acknowledges the subject with an intent to take action) stage today.  She is interested in medication to help her quit smoking    Sinus congestion  Patient had COVID about 2 months ago and ever since then she has felt congested with sinus pressure. She has a hx of eustachian tube dysfunction and multiple ear injections. She has had little sinus discharge and feels that when she tries to blow her nose there is something there but it won't come out. She has been using saline spray but does not use high pressure irrigation due to ear discomfort. She is using azelastine infrequently, nasonex BID, Xyzal and Singulair. She has hx of seasonal allergies worse in the spring time.       Patient Active Problem List   Diagnosis    Seasonal allergies    Mixed hyperlipidemia    Essential hypertension    Chronic hyponatremia    Family history of elevated lipoprotein (a)    ETD (eustachian tube  dysfunction)    Gastroesophageal reflux disease    Dry eyes    Prediabetes    Weight gain    Tobacco use    Sinus congestion         Past Medical History:    Past Medical History:   Diagnosis Date    Arthritis     GERD (gastroesophageal reflux disease)     Hiatal hernia     Hypertension     Thyroid disease     cysts       Past Surgical History:  Past Surgical History:   Procedure Laterality Date    COLONOSCOPY      2011    COLONOSCOPY N/A 02/19/2021    COLONOSCOPY POLYPECTOMY SNARE/COLD BIOPSY performed by Laurell Josephs, MD at [facility]:  Prior to Visit Medications    Medication Sig Taking? Authorizing Provider   buPROPion [facility] SR) 150 MG extended release tablet Take 1 tablet by mouth 2  times daily Yes Karder Goodin K Archie Atilano, DO   olmesartan (BENICAR) 5 MG tablet Take 1 tablet by mouth daily TAKE 1 & 1/2 (ONE & ONE-HALF) TABLETS BY MOUTH ONCE DAILY Yes Zana Biancardi K Baine Decesare, DO   azelastine HCl 0.15 % SOLN 1 spray by Each Nostril route daily USE 2 SPRAY(S) TWICE DAILY INTRANASALLY AS DIRECTED Yes Ruthia Person K Ceonna Frazzini, DO   mometasone (NASONEX) 50 MCG/ACT nasal spray 2 sprays by Nasal route daily Yes Eman Rynders K Kong Packett, DO   hydroCHLOROthiazide (MICROZIDE) 12.5 MG capsule Take 1 capsule by mouth daily Yes Lazette Estala K Haileyann Staiger, DO   glycerin-hypromellose-PEG 400 (DRY EYE RELIEF DROPS) 0.2-0.2-1 % SOLN opthalmic solution Place 1 drop into both eyes as needed (as needed for dry eyes) Yes Genesia Caslin K Jovaughn Wojtaszek, DO   pantoprazole (PROTONIX) 40 MG tablet Take 1 tablet by mouth daily Yes Geniyah Eischeid K Dink Creps, DO   rosuvastatin (CRESTOR) 20 MG tablet TAKE 1 TABLET BY MOUTH ONCE DAILY AT BEDTIME Yes Orestes Geiman K Briana Farner, DO   levocetirizine (XYZAL) 5 MG tablet TAKE 1 TABLET BY MOUTH ONCE DAILY IN THE EVENING Yes Zakiyyah Savannah K Tolulope Pinkett, DO   montelukast (SINGULAIR) 10 MG tablet Take 1 tablet by mouth nightly Yes Thomson Herbers K Zavien Clubb, DO   aspirin 81 MG EC tablet Take 81 mg by mouth daily Yes Historical Provider, MD    EPINEPHrine (EPIPEN) 0.3 MG/0.3ML SOAJ injection USE 1 PEN INTRAMUSCULARLY AR NEEDED ONLY FOR SEVERE REACTION FOR 1 DAY Yes Historical Provider, MD       Allergies:    Food, Prednazoline, Cipro xr, and Sulfa antibiotics    Family History:       Problem Relation Age of Onset    Diabetes Mother         borderline    Kidney Disease Father     Dementia Father          Health Maintenance Completed:  Health Maintenance   Topic Date Due    Cervical cancer screen  Never done    Low dose CT lung screening  Never done    COVID-19 Vaccine (3 - Booster for Pfizer series) 10/18/2019    Breast cancer screen  04/20/2020    Shingles vaccine (2 of 2) 08/16/2021    A1C test (Diabetic or Prediabetic)  11/15/2021    Lipids  11/15/2021    Depression Screen  04/11/2022    DTaP/Tdap/Td vaccine (2 - Td or Tdap) 07/22/2026    Colorectal Cancer Screen  02/20/2031    Flu vaccine  Completed    Pneumococcal 0-64 years Vaccine  Completed    Hepatitis C screen  Completed    HIV screen  Completed    Hepatitis A vaccine  Aged Out    Hib vaccine  Aged Out    Meningococcal (ACWY) vaccine  Aged Out          Immunization History   Administered Date(s) Administered    COVID-19, PFIZER PURPLE top, DILUTE for use, (age 882 y+), 79mg/0.3mL 08/02/2019, 08/23/2019    Hep B, ENGERIX-B, (age 20y+), IM, 121m03/28/2018, 07/21/2016, 12/19/2016    Influenza Virus Vaccine 06/18/2016    Influenza, FLUARIX, FLULAVAL, FLUZONE (age 88 40o+) AND AFLURIA, (age 29 73+), PF, 0.58m77m9/28/2018    Influenza, FLUBLOK, (age 66 51), PF, 0.58mL32m/31/2022    MMR, PRIORIX, M-M-R II, (age 34m+64mC, 0.58mL 07m6/2018, 07/28/2016    PPD Test 11/10/2016    Pneumococcal, PCV20, PREVNAR 20, (age 66y+), IM, 0.58mL 0343m/2023    TDaP, ADACEL (age 10y-64y19y-64y  Durwin Reges (age 580y+), IM, 0.26m 07/21/2016    Varicella, VARIVAX, (age 5824m, SC, 0.40m68m4/08/2016    Zoster Live (Zostavax) 06/27/2016, 07/28/2016    Zoster Recombinant (Shingrix) 06/21/2021         Review of Systems:  Review of Systems    Constitutional:  Negative for activity change, chills and fever.   HENT:  Negative for congestion, rhinorrhea and sore throat.    Eyes:  Negative for pain and discharge.   Respiratory:  Negative for cough and shortness of breath.    Cardiovascular:  Negative for chest pain and palpitations.   Gastrointestinal:  Negative for abdominal pain, nausea and vomiting.   Genitourinary:  Negative for difficulty urinating and dysuria.   Musculoskeletal:  Negative for arthralgias and myalgias.   Skin:  Negative for rash and wound.   Neurological:  Negative for dizziness and syncope.   Psychiatric/Behavioral:  Negative for agitation and behavioral problems.       Physical Exam:   Vitals:    06/21/21 1046   BP: 126/72   Site: Left Upper Arm   Position: Sitting   Cuff Size: Small Adult   Pulse: 89   Temp: 98.1 F (36.7 C)   TempSrc: Temporal   SpO2: 98%   Weight: 137 lb (62.1 kg)     Body mass index is 24.66 kg/m.    Wt Readings from Last 3 Encounters:   06/21/21 137 lb (62.1 kg)   04/11/21 143 lb 12.8 oz (65.2 kg)   02/12/21 130 lb (59 kg)       BP Readings from Last 3 Encounters:   06/21/21 126/72   04/11/21 128/74   02/19/21 (!) 117/59       Physical Exam  Constitutional:       Appearance: Normal appearance.   HENT:      Head: Normocephalic and atraumatic.      Right Ear: A middle ear effusion is present. Tympanic membrane is scarred.      Left Ear: A middle ear effusion is present. Tympanic membrane is scarred.      Nose: Nose normal. No congestion or rhinorrhea.      Right Sinus: No maxillary sinus tenderness or frontal sinus tenderness.      Left Sinus: No maxillary sinus tenderness or frontal sinus tenderness.   Eyes:      Extraocular Movements: Extraocular movements intact.      Conjunctiva/sclera: Conjunctivae normal.      Pupils: Pupils are equal, round, and reactive to light.   Cardiovascular:      Rate and Rhythm: Normal rate and regular rhythm.      Pulses: Normal pulses.      Heart sounds: Normal heart sounds.    Pulmonary:      Effort: Pulmonary effort is normal.      Breath sounds: Normal breath sounds.   Abdominal:      General: Abdomen is flat. Bowel sounds are normal.      Palpations: Abdomen is soft.      Tenderness: There is no abdominal tenderness. There is no guarding or rebound.   Musculoskeletal:         General: Normal range of motion.   Lymphadenopathy:      Cervical: No cervical adenopathy.   Skin:     General: Skin is warm and dry.   Neurological:      General: No focal deficit present.      Mental Status: She is alert and oriented to person, place, and  time.   Psychiatric:         Mood and Affect: Mood normal.         Behavior: Behavior normal.            Lab Review:   No visits with results within 2 Month(s) from this visit.   Latest known visit with results is:   Hospital Outpatient Visit on 04/11/2021   Component Date Value    Sodium 04/11/2021 132 (A)     Potassium 04/11/2021 4.6     Chloride 04/11/2021 93 (A)     CO2 04/11/2021 26     Anion Gap 04/11/2021 13     Glucose 04/11/2021 85     BUN 04/11/2021 7     Creatinine 04/11/2021 0.7     Est, Glom Filt Rate 04/11/2021 >60     Calcium 04/11/2021 9.6     Total Protein 04/11/2021 6.5     Albumin 04/11/2021 4.3     Albumin/Globulin Ratio 04/11/2021 2.0     Total Bilirubin 04/11/2021 0.3     Alkaline Phosphatase 04/11/2021 53     ALT 04/11/2021 10     AST 04/11/2021 16           Assessment/Plan:  [name] 56 y.o. female has Seasonal allergies; Mixed hyperlipidemia; Essential hypertension; Chronic hyponatremia; Family history of elevated lipoprotein (a); ETD (eustachian tube dysfunction); Gastroesophageal reflux disease; Dry eyes; Prediabetes; Weight gain; Tobacco use; and Sinus congestion on their problem list.   Problem List       ETD (eustachian tube dysfunction)      Uncontrolled, continue current medications   - ENT referral          Sinus congestion - Primary      Uncontrolled, continue current medications   - ENT referral  - Recommend saline  sinus irrigation as tolerated            Relevant Orders    [place] - Mertha Baars, Beatty, MD, Otolaryngology, [place]    Tobacco use      Uncontrolled, changes made today: start bupropion   - Recommend setting quit date- patient plans to quit this weekend  - If patient does not tolerate quitting cold [place] recommend gradually decreasing tobacco use  - Hesitant to use Chantix (varenicline) as patient has a hx of night terrors  - f/u 1 mo         Relevant Medications    buPROPion (WELLBUTRIN SR) 150 MG extended release tablet    Other Relevant Orders    CT Lung Screen (Initial or Annual)       Encounter for screening mammogram for breast cancer  -     MAM Digital Screen Bilateral [[id number]]; Future    Need for prophylactic vaccination against Streptococcus pneumoniae (pneumococcus)  -     Pneumococcal Conjugate PCV20, PF (Prevnar 20)    Need for prophylactic vaccination and inoculation against varicella  -     In-office administration - Zoster [facility])          Health Maintenance Due:  Health Maintenance Due   Topic Date Due    Cervical cancer screen  Never done    Low dose CT lung screening  Never done    COVID-19 Vaccine (3 - Booster for Pfizer series) 10/18/2019    Breast cancer screen  04/20/2020        RTC:  Return in about 1 month (around 07/21/2021).     EMR Dragon/transcription disclaimer:  Much of this  encounter note is electronic transcription/translation of spoken language to printed texts.  The electronic translation of spoken language Meredith Mells be erroneous, or at times, nonsensical words or phrases Lylianna Fraiser be inadvertently transcribed.  Although I have reviewed the note for such errors, some Melesio Madara still exist.

## 2021-06-21 NOTE — Assessment & Plan Note (Signed)
Uncontrolled, continue current medications   - ENT referral

## 2021-06-21 NOTE — Assessment & Plan Note (Signed)
Uncontrolled, changes made today: start bupropion   - Recommend setting quit date- patient plans to quit this weekend  - If patient does not tolerate quitting cold [place] recommend gradually decreasing tobacco use  - Hesitant to use Chantix (varenicline) as patient has a hx of night terrors  - f/u 1 mo

## 2021-06-21 NOTE — Patient Instructions (Signed)
Central scheduling  Call [phone]

## 2021-06-27 NOTE — Telephone Encounter (Signed)
Patient is calling wanting to know if she can go down to buPROPion [facility] SR) 150 MG extended release tablet and only take one per day. Patient states she is in classes until 6pm and would like a message left with an answer. She states she is not sleeping

## 2021-06-27 NOTE — Telephone Encounter (Signed)
See pt message re:  medication dose

## 2021-06-29 ENCOUNTER — Inpatient Hospital Stay
Admit: 2021-06-29 | Discharge: 2021-06-29 | Disposition: A | Discharge status: Home / Self Care | Payer: BLUE CROSS/BLUE SHIELD

## 2021-06-29 DIAGNOSIS — E871 Hypo-osmolality and hyponatremia: Secondary | ICD-10-CM

## 2021-06-29 LAB — CBC WITH AUTO DIFFERENTIAL
Basophils %: 1 %
Basophils Absolute: 0.1 10*3/uL (ref 0.0–0.2)
Eosinophils %: 0.7 %
Eosinophils Absolute: 0.1 10*3/uL (ref 0.0–0.6)
Hematocrit: 42 % (ref 36.0–48.0)
Hemoglobin: 14.5 g/dL (ref 12.0–16.0)
Lymphocytes %: 20.2 %
Lymphocytes Absolute: 1.8 10*3/uL (ref 1.0–5.1)
MCH: 31.9 pg (ref 26.0–34.0)
MCHC: 34.4 g/dL (ref 31.0–36.0)
MCV: 92.8 fL (ref 80.0–100.0)
MPV: 6.5 fL (ref 5.0–10.5)
Monocytes %: 8.4 %
Monocytes Absolute: 0.7 10*3/uL (ref 0.0–1.3)
Neutrophils %: 69.7 %
Neutrophils Absolute: 6.1 10*3/uL (ref 1.7–7.7)
Platelets: 475 10*3/uL — ABNORMAL HIGH (ref 135–450)
RBC: 4.53 M/uL (ref 4.00–5.20)
RDW: 13.9 % (ref 12.4–15.4)
WBC: 8.8 10*3/uL (ref 4.0–11.0)

## 2021-06-29 LAB — COMPREHENSIVE METABOLIC PANEL
ALT: 12 U/L (ref 10–40)
AST: 16 U/L (ref 15–37)
Albumin/Globulin Ratio: 2 (ref 1.1–2.2)
Albumin: 5.1 g/dL — ABNORMAL HIGH (ref 3.4–5.0)
Alkaline Phosphatase: 61 U/L (ref 40–129)
Anion Gap: 10 (ref 3–16)
BUN: 7 mg/dL (ref 7–20)
CO2: 26 mmol/L (ref 21–32)
Calcium: 9.8 mg/dL (ref 8.3–10.6)
Chloride: 91 mmol/L — ABNORMAL LOW (ref 99–110)
Creatinine: 0.8 mg/dL (ref 0.6–1.1)
Est, Glom Filt Rate: >60 (ref >60)
Glucose: 100 mg/dL — ABNORMAL HIGH (ref 70–99)
Potassium: 4.4 mmol/L (ref 3.5–5.1)
Sodium: 127 mmol/L — ABNORMAL LOW (ref 136–145)
Total Bilirubin: 0.5 mg/dL (ref 0.0–1.0)
Total Protein: 7.6 g/dL (ref 6.4–8.2)

## 2021-06-29 MED ORDER — SODIUM CHLORIDE 0.9 % IV BOLUS
0.9 % | Freq: Once | INTRAVENOUS | Status: AC
Start: 2021-06-29 — End: 2021-06-29
  Administered 2021-06-29: 20:00:00 500 mL via INTRAVENOUS

## 2021-06-29 NOTE — ED Provider Notes (Signed)
[facility]  ED  EMERGENCY DEPARTMENT ENCOUNTER        Pt Name: Susan [name]  MRN: [id number]  Birthdate 1965-03-30  Date of evaluation: 06/29/2021  Provider: Sandria Bales, PA  PCP: Abran Duke, DO  Note Started: 5:26 PM EDT       APP. I have evaluated this patient.  My supervising physician was available for consultation.    CHIEF COMPLAINT       Chief Complaint   Patient presents with    Other     Patient seen at [facility] yesterday for "heartburn". States the hospital told her that her sodium was 124. States she did not get IVF at [facility]        HISTORY OF PRESENT ILLNESS      Chief Complaint: Hyponatremia    ORIE CUTTINO is a 56 y.o. female who presents to the emergency room for evaluation of hyponatremia.  Patient was evaluated yesterday for heartburn at an outside emergency department.  They treated her heartburn with GI cocktail, which resolved her symptoms.  She also reports that her heart work-up was negative.  Her primary care provider was concerned because her sodium was 124, and not addressed during her stay in the ED.    Today she reports feeling on, which she attributes to the Wellbutrin that she recently started in order to help her quit smoking.  She reports a dry mouth, as well as difficulty concentrating, and sleeping.  In fact she had to cut back on her dose due to its propensity to keep her awake at night.    She denies fevers, chills.  No nausea or vomiting.  No confusion.  No headache.    SCREENINGS    Glasgow Coma Scale  Eye Opening: Spontaneous  Best Verbal Response: Oriented  Best Motor Response: Obeys commands  Glasgow Coma Scale Score: 15      Is this patient to be included in the SEP-1 Core Measure due to severe sepsis or septic shock?   No   Exclusion criteria - the patient is NOT to be included for SEP-1 Core Measure due to:  Infection is not suspected      PHYSICAL EXAM     Vitals: BP 132/86   Pulse 81   Temp 97.7 F (36.5 C) (Oral)   Resp 14   Wt 134  lb (60.8 kg)   LMP 07/19/2016   SpO2 99%   BMI 24.12 kg/m    General: awake, alert, no apparent distress  Pupils: equal, reactive  Head: Non-traumatic  Heart: Rate as noted, regular rhythm, no murmur or rubs.  Chest/Lungs: CTAB, no wheezes or crackles  Abdomen: soft, nondistended, no tenderness to palpation   Extremities:  cap refill <2 UE/LE, no tenderness of calves, no edema  Neuro: no facial droop, no slurred speech, answers questions appropriately.   Skin: Warm. No visible rash, lesions, or bruising       DIAGNOSTIC RESULTS   LABS:    Labs Reviewed   CBC WITH AUTO DIFFERENTIAL - Abnormal; Notable for the following components:       Result Value    Platelets 475 (*)     All other components within normal limits   COMPREHENSIVE METABOLIC PANEL - Abnormal; Notable for the following components:    Sodium 127 (*)     Chloride 91 (*)     Glucose 100 (*)     Albumin 5.1 (*)     All other components within  normal limits       EKG: When ordered, EKG's are interpreted by the Emergency Department Physician in the absence of a cardiologist.  Please see their note for interpretation of EKG.    RADIOLOGY:   No orders to display     No results found.    No results found.    PROCEDURES       CRITICAL CARE TIME   I personally saw the patient and independently provided 0 minutes of non-concurrent critical care time out of the total critical care time provided.  This excludes time spent doing separately billable procedures.  This includes time at the bedside, data interpretation, medication management, obtaining critical history from collateral sources if the patient is unable to provide it directly, and physician consultation.  Specifics of interventions taken and potentially life-threatening diagnostic considerations are listed above in the medical decision making.    CONSULTS   None    ED COURSE and MEDICAL DECISIONS MAKING:   Vitals:    Vitals:    06/29/21 1451 06/29/21 1730   BP: (!) 140/85 132/86   Pulse: 85 81   Resp:  16 14   Temp: 97.7 F (36.5 C)    TempSrc: Oral    SpO2: 100% 99%   Weight: 134 lb (60.8 kg)      MEDICATIONS:  Medications   0.9 % sodium chloride bolus (0 mLs IntraVENous Stopped 06/29/21 1659)           56 year old female presents to emergency room for evaluation of hyponatremia.  She is asymptomatic except for a dry mouth, from her Wellbutrin.  She does not have chest pain anymore.  Her work-up shows a CBC with no leukocytosis or anemia.  Her chemistry shows hyponatremia 127.  IV normal saline 500 infused.  She feels similar after the infusion.    Discussed with patient about hyponatremia, recommended she follow-up with her primary for trending.  She is on multiple medications that can contribute to this diagnosis, but because it is not severe I will not adjust medications and leave that to the primary doctor.    Patient knows to return to the ED should symptoms change or worsen.    FINAL IMPRESSION      1. Hyponatremia          DISPOSITION/PLAN   DISPOSITION Decision To Discharge 06/29/2021 05:25:27 PM      PATIENT REFERRED TO:  No follow-up provider specified.    DISCHARGE MEDICATIONS:  Discharge Medication List as of 06/29/2021  5:35 PM          Sandria Bales, PA (electronically signed)        Laurance Flatten, PA  06/29/21 2357

## 2021-06-29 NOTE — Discharge Instructions (Signed)
-  Follow up with your primary doctor.   -Come back if you feel worse.

## 2021-06-29 NOTE — Progress Notes (Signed)
Patient called and stated that she was seen in urgent care for heartburn.  Poor p.o. intake for the last 3 days.  They told her that her sodium was low, 124.  She has chronic hyponatremia but does not typically run this low.  She is having symptoms of dizziness, "fuzzy " and "swimmy " head, confusion, headache, nausea, weakness for the last 1 to 2 days.  No gait abnormalities reported.  She has been drinking Pedialyt e.  Patient directed to go to the emergency room to have her sodium checked and that she Dovber Ernest need IV fluids.  Concern for severe hyponatremia and/or dehydration.  Patient stated that she would have her husband drive her to the emergency room.

## 2021-08-20 MED ORDER — LEVOCETIRIZINE DIHYDROCHLORIDE 5 MG PO TABS
5 MG | ORAL_TABLET | ORAL | 0 refills | Status: AC
Start: 2021-08-20 — End: 2021-09-19

## 2021-08-20 MED ORDER — PANTOPRAZOLE SODIUM 40 MG PO TBEC
40 MG | ORAL_TABLET | ORAL | 0 refills | Status: AC
Start: 2021-08-20 — End: 2021-09-19

## 2021-08-20 MED ORDER — MONTELUKAST SODIUM 10 MG PO TABS
10 MG | ORAL_TABLET | Freq: Every evening | ORAL | 0 refills | Status: DC
Start: 2021-08-20 — End: 2021-09-19

## 2021-08-20 MED ORDER — ROSUVASTATIN CALCIUM 20 MG PO TABS
20 MG | ORAL_TABLET | ORAL | 0 refills | Status: AC
Start: 2021-08-20 — End: 2021-09-19

## 2021-08-20 NOTE — Telephone Encounter (Signed)
Refill Request - schedule is being worked on not available yet       Last Seen: Last Seen Department: 06/21/2021  Last Seen by PCP: 06/21/2021    Last Written:   levocetirizine (XYZAL) 5 MG tablet -02/20/89 With 1  Montelukast 10mg  - 02/20/2021 #90 with 1  Rosuvastatin 20mg  - 02/20/2021 #90 with 1  Pantoprazole 40mg  -02/20/21 #90 with 1      Next Appointment:   No future appointments.          Requested Prescriptions     Pending Prescriptions Disp Refills    levocetirizine (XYZAL) 5 MG tablet [Pharmacy Med Name: Levocetirizine Dihydrochloride 5 MG Oral Tablet] 90 tablet 0     Sig: TAKE 1 TABLET BY MOUTH ONCE DAILY IN THE EVENING    montelukast (SINGULAIR) 10 MG tablet [Pharmacy Med Name: Montelukast Sodium 10 MG Oral Tablet] 90 tablet 0     Sig: Take 1 tablet by mouth nightly    rosuvastatin (CRESTOR) 20 MG tablet [Pharmacy Med Name: Rosuvastatin Calcium 20 MG Oral Tablet] 90 tablet 0     Sig: TAKE 1 TABLET BY MOUTH ONCE DAILY AT BEDTIME    pantoprazole (PROTONIX) 40 MG tablet [Pharmacy Med Name: Pantoprazole Sodium 40 MG Oral Tablet Delayed Release] 90 tablet 0     Sig: Take 1 tablet by mouth once daily

## 2021-09-13 NOTE — Telephone Encounter (Signed)
[phone] (home)   LM on pt VM .  Please let pt know she can get her injection on 09/19/21 just remind staff she wants

## 2021-09-13 NOTE — Telephone Encounter (Signed)
Pt should able to get the second shingles vaccine she should be able to get it a her next visit ?

## 2021-09-13 NOTE — Telephone Encounter (Signed)
Patient called to find out if they are able to get the second shingle shot. Patient stated they got the first shingle shot 06/21/21.     Please call patient to advise if shingle shots need to be restarted,  Susan [name]  [phone] (home)

## 2021-09-16 ENCOUNTER — Encounter
Payer: PRIVATE HEALTH INSURANCE | Attending: Student in an Organized Health Care Education/Training Program | Primary: Family Medicine

## 2021-09-19 ENCOUNTER — Ambulatory Visit
Admit: 2021-09-19 | Discharge: 2021-09-19 | Payer: PRIVATE HEALTH INSURANCE | Attending: Student in an Organized Health Care Education/Training Program | Primary: Family Medicine

## 2021-09-19 DIAGNOSIS — Z23 Encounter for immunization: Secondary | ICD-10-CM

## 2021-09-19 MED ORDER — HYDROCHLOROTHIAZIDE 12.5 MG PO CAPS
12.5 MG | ORAL_CAPSULE | Freq: Every day | ORAL | 1 refills | Status: AC
Start: 2021-09-19 — End: 2021-10-03

## 2021-09-19 MED ORDER — MONTELUKAST SODIUM 10 MG PO TABS
10 MG | ORAL_TABLET | Freq: Every evening | ORAL | 1 refills | Status: DC
Start: 2021-09-19 — End: 2021-10-24

## 2021-09-19 MED ORDER — OLMESARTAN MEDOXOMIL 5 MG PO TABS
5 MG | ORAL_TABLET | Freq: Every day | ORAL | 1 refills | Status: DC
Start: 2021-09-19 — End: 2021-09-30

## 2021-09-19 MED ORDER — LEVOCETIRIZINE DIHYDROCHLORIDE 5 MG PO TABS
5 MG | ORAL_TABLET | Freq: Every evening | ORAL | 1 refills | Status: DC
Start: 2021-09-19 — End: 2021-10-24

## 2021-09-19 MED ORDER — PANTOPRAZOLE SODIUM 40 MG PO TBEC
40 MG | ORAL_TABLET | Freq: Every day | ORAL | 1 refills | Status: DC
Start: 2021-09-19 — End: 2021-10-24

## 2021-09-19 MED ORDER — ROSUVASTATIN CALCIUM 20 MG PO TABS
20 MG | ORAL_TABLET | Freq: Every evening | ORAL | 1 refills | Status: DC
Start: 2021-09-19 — End: 2021-10-24

## 2021-09-19 NOTE — Progress Notes (Signed)
Lab preformed in R arm and sent number of tubes below to be processed.    attempt made and stopped bleeding by applying band aide and guaze.     1 Red    1 purple    0 blue    0 Urine

## 2021-09-19 NOTE — Progress Notes (Signed)
[facility]                               [facility]                                           [address], [place] [address]        Phone: [phone]                                     NAME:  Susan [name]  DOB:    [birth year]      CONSULTANTS  Patient Care Team:  Darlin Drop May, DO as PCP - General (Family Medicine)  Laurell Josephs, MD as Consulting Physician (Gastroenterology)      CHIEF COMPLAINT  Susan [name] is a 56 y.o. female presenting as an established patient for acute, chronic, and personalized prevention plan services as noted below.      HISTORY OF PRESENT ILLNESS    The patient coming in for 3 goals:    Medication refill - pt would like refill for her chronic meds, denies any side-effects or needing dose adjustments  Re-check her Na level given chronic hyponatremia of unknown etiology for the past 5 years, states that she was on HCTZ well before becoming chronically hyponatremic and was put on HCTZ due to side-effects of leg edema from being on other BP medication (presumed CCB). Endorses headache from fasting but no hyponatremic symptoms today.  Receive 2nd Shingles vaccine today - last one received on 06/21/2021      PAST MEDICAL HISTORY  Past Medical History:   Diagnosis Date    Arthritis     GERD (gastroesophageal reflux disease)     Hiatal hernia     Hypertension     Thyroid disease     cysts       PAST SURGICAL HISTORY  Past Surgical History:   Procedure Laterality Date    COLONOSCOPY      2011    COLONOSCOPY N/A 02/19/2021    COLONOSCOPY POLYPECTOMY SNARE/COLD BIOPSY performed by Laurell Josephs, MD at [facility]  Prior to Visit Medications    Medication Sig Taking? Authorizing Provider   hydroCHLOROthiazide (MICROZIDE) 12.5 MG capsule Take 1 capsule by mouth daily Yes Damita Lack, MD PhD   levocetirizine (XYZAL) 5 MG tablet Take 1 tablet by mouth nightly Yes Damita Lack,  MD PhD   montelukast (SINGULAIR) 10 MG tablet Take 1 tablet by mouth nightly Yes Damita Lack, MD PhD   olmesartan (BENICAR) 5 MG tablet Take 1 tablet by mouth daily TAKE 1 & 1/2 (ONE & ONE-HALF) TABLETS BY MOUTH ONCE DAILY Yes Damita Lack, MD PhD   pantoprazole (PROTONIX) 40 MG tablet Take 1 tablet by mouth daily Yes Damita Lack, MD PhD   rosuvastatin (CRESTOR) 20 MG tablet Take 1 tablet by mouth nightly Yes Damita Lack, MD PhD   azelastine HCl 0.15 % SOLN 1 spray by Each Nostril route daily USE 2 SPRAY(S) TWICE DAILY INTRANASALLY AS DIRECTED Yes Magdelene K May, DO   mometasone (NASONEX) 50 MCG/ACT nasal spray 2 sprays by  Nasal route daily Yes Magdelene K May, DO   glycerin-hypromellose-PEG 400 (DRY EYE RELIEF DROPS) 0.2-0.2-1 % SOLN opthalmic solution Place 1 drop into both eyes as needed (as needed for dry eyes) Yes Magdelene K May, DO   aspirin 81 MG EC tablet Take 1 tablet by mouth daily Yes Historical Provider, MD   EPINEPHrine (EPIPEN) 0.3 MG/0.3ML SOAJ injection USE 1 PEN INTRAMUSCULARLY AR NEEDED ONLY FOR SEVERE REACTION FOR 1 DAY Yes Historical Provider, MD   buPROPion (WELLBUTRIN SR) 150 MG extended release tablet Take 1 tablet by mouth 2 times daily  Patient not taking: Reported on 09/19/2021  Magdelene K May, DO       ALLERGIES  Food, Prednazoline, Cipro xr, and Sulfa antibiotics      FAMILY HISTORY      Problem Relation Age of Onset    Diabetes Mother         borderline    Kidney Disease Father     Dementia Father        SOCIAL HISTORY  Social History       Tobacco History       Smoking Status  Every Day Smoking Start Date  1986 Smoking Frequency  1 pack/day for 25.00 years (25.00 pk-yrs) Smoking Tobacco Type  Cigarettes since 1986      Smokeless Tobacco Use  Never              Alcohol History       Alcohol Use Status  No              Drug Use       Drug Use Status  Never              Sexual Activity       Sexually Active  Not Asked                    [place] Maintenance   Topic  Date Due    Cervical cancer screen  Never done    Low dose CT lung screening &/or counseling  Never done    COVID-19 Vaccine (3 - Booster for Pfizer series) 10/18/2019    Breast cancer screen  04/20/2020    A1C test (Diabetic or Prediabetic)  11/15/2021    Lipids  11/15/2021    Depression Screen  06/22/2022    DTaP/Tdap/Td vaccine (2 - Td or Tdap) 07/22/2026    Colorectal Cancer Screen  02/20/2031    Flu vaccine  Completed    Shingles vaccine  Completed    Pneumococcal 0-64 years Vaccine  Completed    Hepatitis C screen  Completed    HIV screen  Completed    Hepatitis A vaccine  Aged Out    Hib vaccine  Aged Out    Meningococcal (ACWY) vaccine  Aged Out    Hepatitis B vaccine  Discontinued     Immunization History   Administered Date(s) Administered    COVID-19, PFIZER PURPLE top, DILUTE for use, (age 24 y+), 61mg/0.3mL 08/02/2019, 08/23/2019    Hep B, ENGERIX-B, (age 20y+), IM, 77m03/28/2018, 07/21/2016, 12/19/2016    Influenza Virus Vaccine 06/18/2016    Influenza, FLUARIX, FLULAVAL, FLUZONE (age 10 52o+) AND AFLURIA, (age 78 64+), PF, 0.1m61m9/28/2018    Influenza, FLUBLOK, (age 62 28), PF, 0.1mL10m/31/2022    MMR, PRIORIX, M-M-R II, (age 77m+56mC, 0.1mL 028m6/2018, 07/28/2016    PPD Test 11/10/2016    Pneumococcal, PCV20, PREVNAR 20, (a28  18y+), IM, 0.23m 06/21/2021    TDaP, ADACEL (age 3960y-64y, BOOSTRIX (age 390y+), IM, 0.532m04/30/2018    Varicella, VARIVAX, (age 39229m SC, 0.5mL85m/08/2016    Zoster Live (Zostavax) 06/27/2016, 07/28/2016    Zoster Recombinant (Shingrix) 06/21/2021, 09/19/2021       REVIEW OF SYSTEMS  Relevant RoS were stated in the HPI      PHYSICAL EXAM  Vitals:    09/19/21 1530   BP: 130/70   Site: Left Upper Arm   Position: Sitting   Cuff Size: Small Adult   Pulse: 91   SpO2: 98%   Weight: 136 lb (61.7 kg)     Body mass index is 24.48 kg/m.    Wt Readings from Last 3 Encounters:   09/19/21 136 lb (61.7 kg)   06/29/21 134 lb (60.8 kg)   06/21/21 137 lb (62.1 kg)     BP Readings from Last 3  Encounters:   09/19/21 130/70   06/29/21 132/86   06/21/21 126/72     Physical Exam    GEN: appears stated age, no acute distress  HEENT:  -Head: normocephalic, atraumatic  -Eyes: extraocular muscles intact, no discharge or redness  -Ears: normal external ears  -Nose: normal nares  -Mouth and Throat: moist mucous membranes, no thrush, no vesicles, no lesions  CV: regular rate rhythm, no murmurs/rubs/gallops  LUNGS: clear to auscultation bilaterally, no rales/rhonchi/wheezes  ABD: normal bowel sounds, soft, non-distended, non-tender, no rebound, no guarding  GU: N/A  SKIN: no rashes, no lesions, no erythema  MSK: normal gait, no deformities  EXT: no clubbing, cyanosis, or edema.  NEURO: ambulating with no limitations, no focal deficits  PSYCH: appropriate for the situation, normal memory, mood, and affect      LAB REVIEW  Admission on 06/29/2021, Discharged on 06/29/2021   Component Date Value    WBC 06/29/2021 8.8     RBC 06/29/2021 4.53     Hemoglobin 06/29/2021 14.5     Hematocrit 06/29/2021 42.0     MCV 06/29/2021 92.8     MCH 06/29/2021 31.9     MCHC 06/29/2021 34.4     RDW 06/29/2021 13.9     Platelets 06/29/2021 475 (H)     MPV 06/29/2021 6.5     Neutrophils % 06/29/2021 69.7     Lymphocytes % 06/29/2021 20.2     Monocytes % 06/29/2021 8.4     Eosinophils % 06/29/2021 0.7     Basophils % 06/29/2021 1.0     Neutrophils Absolute 06/29/2021 6.1     Lymphocytes Absolute 06/29/2021 1.8     Monocytes Absolute 06/29/2021 0.7     Eosinophils Absolute 06/29/2021 0.1     Basophils Absolute 06/29/2021 0.1     Sodium 06/29/2021 127 (L)     Potassium 06/29/2021 4.4     Chloride 06/29/2021 91 (L)     CO2 06/29/2021 26     Anion Gap 06/29/2021 10     Glucose 06/29/2021 100 (H)     BUN 06/29/2021 7     Creatinine 06/29/2021 0.8     Est, Glom Filt Rate 06/29/2021 >60     Calcium 06/29/2021 9.8     Total Protein 06/29/2021 7.6     Albumin 06/29/2021 5.1 (H)     Albumin/Globulin Ratio 06/29/2021 2.0     Total Bilirubin  06/29/2021 0.5     Alkaline Phosphatase 06/29/2021 61     ALT 06/29/2021 12     AST 06/29/2021 16  Hospital Outpatient Visit on 04/11/2021   Component Date Value    Sodium 04/11/2021 132 (L)     Potassium 04/11/2021 4.6     Chloride 04/11/2021 93 (L)     CO2 04/11/2021 26     Anion Gap 04/11/2021 13     Glucose 04/11/2021 85     BUN 04/11/2021 7     Creatinine 04/11/2021 0.7     Est, Glom Filt Rate 04/11/2021 >60     Calcium 04/11/2021 9.6     Total Protein 04/11/2021 6.5     Albumin 04/11/2021 4.3     Albumin/Globulin Ratio 04/11/2021 2.0     Total Bilirubin 04/11/2021 0.3     Alkaline Phosphatase 04/11/2021 53     ALT 04/11/2021 10     AST 04/11/2021 16    Abstract on 04/11/2021   Component Date Value    Mammography, External 04/20/2018 Neg      Discussed the relevant lab results with the patient.      ASSESSMENT  Susan [name] is a 56 y.o. female presenting as an established patient for acute, chronic, and personalized prevention plan services as noted below.    The plans listed below are in accordance with the latest evidence-based practice guidelines from societies including but not limited to USPSTF, AAFP, ADA, ACA/AHA, ISH, and many others. If the plans deviate from the guidelines or from the latest evidence of practice, they are done so with shared discussions with the patient and by weighing the pros and cons of each individual case.      PLAN    1. Refill medications  - hydroCHLOROthiazide (MICROZIDE) 12.5 MG capsule; Take 1 capsule by mouth daily  Dispense: 90 capsule; Refill: 1  - levocetirizine (XYZAL) 5 MG tablet; Take 1 tablet by mouth nightly  Dispense: 90 tablet; Refill: 1  - montelukast (SINGULAIR) 10 MG tablet; Take 1 tablet by mouth nightly  Dispense: 90 tablet; Refill: 1  - olmesartan (BENICAR) 5 MG tablet; Take 1 tablet by mouth daily TAKE 1 & 1/2 (ONE & ONE-HALF) TABLETS BY MOUTH ONCE DAILY  Dispense: 135 tablet; Refill: 1  - pantoprazole (PROTONIX) 40 MG tablet; Take 1 tablet by mouth  daily  Dispense: 90 tablet; Refill: 1  - rosuvastatin (CRESTOR) 20 MG tablet; Take 1 tablet by mouth nightly  Dispense: 90 tablet; Refill: 1    2. Routine Lab workup including Na level  - CBC with Auto Differential  - Comprehensive Metabolic Panel  - Hemoglobin A1C  - LIPID PANEL    Discussed with the patient of needing to investigate the underlying etiology for chronic hyponatremia. The patient willing to try holding HCTZ but increase olmesartan to 2 pills daily (from 1.5 pills daily) and RTC in 4 weeks for BP and Na level check. Advised the pt that if HCTZ is not the cause then we will consider referring her to other specialist to further investigation for her chronic hyponatremia.    3. 2nd Shingles Vaccine  - In-office administration - Zoster [facility])      [place] Maintenance Due   Topic Date Due    Cervical cancer screen  Never done    Low dose CT lung screening &/or counseling  Never done    COVID-19 Vaccine (3 - Booster for Pfizer series) 10/18/2019    Breast cancer screen  04/20/2020         RETURN TO [facility]  Return in about 4 weeks (around 10/17/2021) for BP  and Na level check.      EMR Dragon/transcription disclaimer:  Much of this encounter note is electronic transcription/translation of spoken language to printed texts.  The electronic translation of spoken language may be erroneous, or at times, nonsensical words or phrases may be inadvertently transcribed.  Although I have reviewed the note for such errors, some may still exist.

## 2021-09-20 LAB — COMPREHENSIVE METABOLIC PANEL
ALT: 13 U/L (ref 10–40)
AST: 19 U/L (ref 15–37)
Albumin/Globulin Ratio: 2.2 (ref 1.1–2.2)
Albumin: 4.6 g/dL (ref 3.4–5.0)
Alkaline Phosphatase: 53 U/L (ref 40–129)
Anion Gap: 14 (ref 3–16)
BUN: 5 mg/dL — ABNORMAL LOW (ref 7–20)
CO2: 23 mmol/L (ref 21–32)
Calcium: 9.6 mg/dL (ref 8.3–10.6)
Chloride: 86 mmol/L — ABNORMAL LOW (ref 99–110)
Creatinine: 0.6 mg/dL (ref 0.6–1.1)
Est, Glom Filt Rate: >60 (ref >60)
Glucose: 86 mg/dL (ref 70–99)
Potassium: 4.4 mmol/L (ref 3.5–5.1)
Sodium: 123 mmol/L — ABNORMAL LOW (ref 136–145)
Total Bilirubin: 0.5 mg/dL (ref 0.0–1.0)
Total Protein: 6.7 g/dL (ref 6.4–8.2)

## 2021-09-20 LAB — CBC WITH AUTO DIFFERENTIAL
Basophils %: 0.7 %
Basophils Absolute: 0.1 10*3/uL (ref 0.0–0.2)
Eosinophils %: 1.9 %
Eosinophils Absolute: 0.1 10*3/uL (ref 0.0–0.6)
Hematocrit: 37.9 % (ref 36.0–48.0)
Hemoglobin: 13 g/dL (ref 12.0–16.0)
Lymphocytes %: 30.9 %
Lymphocytes Absolute: 2.3 10*3/uL (ref 1.0–5.1)
MCH: 32.2 pg (ref 26.0–34.0)
MCHC: 34.2 g/dL (ref 31.0–36.0)
MCV: 94.2 fL (ref 80.0–100.0)
MPV: 7 fL (ref 5.0–10.5)
Monocytes %: 6.5 %
Monocytes Absolute: 0.5 10*3/uL (ref 0.0–1.3)
Neutrophils %: 60 %
Neutrophils Absolute: 4.4 10*3/uL (ref 1.7–7.7)
Platelets: 420 10*3/uL (ref 135–450)
RBC: 4.02 M/uL (ref 4.00–5.20)
RDW: 13.8 % (ref 12.4–15.4)
WBC: 7.4 10*3/uL (ref 4.0–11.0)

## 2021-09-20 LAB — LIPID PANEL
Cholesterol, Total: 130 mg/dL (ref 0–199)
HDL: 47 mg/dL (ref 40–60)
LDL Calculated: 62 mg/dL (ref <100)
Triglycerides: 103 mg/dL (ref 0–150)
VLDL Cholesterol Calculated: 21 mg/dL

## 2021-09-20 LAB — HEMOGLOBIN A1C
Hemoglobin A1C: 6 %
eAG: 125.5 mg/dL

## 2021-09-29 ENCOUNTER — Inpatient Hospital Stay
Admit: 2021-09-29 | Discharge: 2021-09-29 | Disposition: A | Discharge status: Home / Self Care | Payer: BLUE CROSS/BLUE SHIELD

## 2021-09-29 ENCOUNTER — Emergency Department: Admit: 2021-09-29 | Payer: BLUE CROSS/BLUE SHIELD | Primary: Family Medicine

## 2021-09-29 DIAGNOSIS — E871 Hypo-osmolality and hyponatremia: Secondary | ICD-10-CM

## 2021-09-29 DIAGNOSIS — N390 Urinary tract infection, site not specified: Secondary | ICD-10-CM

## 2021-09-29 LAB — CBC WITH AUTO DIFFERENTIAL
Basophils %: 1.1 %
Basophils Absolute: 0.1 10*3/uL (ref 0.0–0.2)
Eosinophils %: 0.8 %
Eosinophils Absolute: 0.1 10*3/uL (ref 0.0–0.6)
Hematocrit: 40 % (ref 36.0–48.0)
Hemoglobin: 13.5 g/dL (ref 12.0–16.0)
Lymphocytes %: 17.8 %
Lymphocytes Absolute: 1.4 10*3/uL (ref 1.0–5.1)
MCH: 32 pg (ref 26.0–34.0)
MCHC: 33.8 g/dL (ref 31.0–36.0)
MCV: 94.7 fL (ref 80.0–100.0)
MPV: 6.6 fL (ref 5.0–10.5)
Monocytes %: 7.7 %
Monocytes Absolute: 0.6 10*3/uL (ref 0.0–1.3)
Neutrophils %: 72.6 %
Neutrophils Absolute: 5.5 10*3/uL (ref 1.7–7.7)
Platelets: 470 10*3/uL — ABNORMAL HIGH (ref 135–450)
RBC: 4.23 M/uL (ref 4.00–5.20)
RDW: 14 % (ref 12.4–15.4)
WBC: 7.7 10*3/uL (ref 4.0–11.0)

## 2021-09-29 LAB — BASIC METABOLIC PANEL
Anion Gap: 10 (ref 3–16)
BUN: 7 mg/dL (ref 7–20)
CO2: 24 mmol/L (ref 21–32)
Calcium: 9.6 mg/dL (ref 8.3–10.6)
Chloride: 95 mmol/L — ABNORMAL LOW (ref 99–110)
Creatinine: 0.6 mg/dL (ref 0.6–1.1)
Est, Glom Filt Rate: >60 (ref >60)
Glucose: 93 mg/dL (ref 70–99)
Potassium: 4.1 mmol/L (ref 3.5–5.1)
Sodium: 129 mmol/L — ABNORMAL LOW (ref 136–145)

## 2021-09-29 LAB — COMPREHENSIVE METABOLIC PANEL
ALT: 13 U/L (ref 10–40)
AST: 29 U/L (ref 15–37)
Albumin/Globulin Ratio: 1.4 (ref 1.1–2.2)
Albumin: 4.2 g/dL (ref 3.4–5.0)
Alkaline Phosphatase: 44 U/L (ref 40–129)
Anion Gap: 11 (ref 3–16)
BUN: 7 mg/dL (ref 7–20)
CO2: 22 mmol/L (ref 21–32)
Calcium: 9.2 mg/dL (ref 8.3–10.6)
Chloride: 93 mmol/L — ABNORMAL LOW (ref 99–110)
Creatinine: 0.6 mg/dL (ref 0.6–1.1)
Est, Glom Filt Rate: >60 (ref >60)
Glucose: 93 mg/dL (ref 70–99)
Potassium: 5.8 mmol/L — ABNORMAL HIGH (ref 3.5–5.1)
Sodium: 126 mmol/L — ABNORMAL LOW (ref 136–145)
Total Bilirubin: 0.4 mg/dL (ref 0.0–1.0)
Total Protein: 7.2 g/dL (ref 6.4–8.2)

## 2021-09-29 LAB — MICROSCOPIC URINALYSIS

## 2021-09-29 LAB — URINALYSIS WITH REFLEX TO CULTURE
Bilirubin Urine: NEGATIVE
Glucose, Ur: NEGATIVE mg/dL
Ketones, Urine: NEGATIVE mg/dL
Nitrite, Urine: NEGATIVE
Protein, UA: NEGATIVE mg/dL
Specific Gravity, UA: 1.01 (ref 1.005–1.030)
Urobilinogen, Urine: 0.2 E.U./dL (ref <2.0)
pH, UA: 7 (ref 5.0–8.0)

## 2021-09-29 LAB — TROPONIN: Troponin, High Sensitivity: <6 ng/L (ref 0–14)

## 2021-09-29 MED ORDER — SODIUM CHLORIDE 0.9 % IV SOLN (MINI-BAG)
0.9 % | Freq: Once | INTRAVENOUS | Status: AC
Start: 2021-09-29 — End: 2021-09-29
  Administered 2021-09-29: 18:00:00 1000 mg via INTRAVENOUS

## 2021-09-29 MED ORDER — KETOROLAC TROMETHAMINE 30 MG/ML IJ SOLN
30 MG/ML | Freq: Once | INTRAMUSCULAR | Status: AC
Start: 2021-09-29 — End: 2021-09-29
  Administered 2021-09-29: 17:00:00 30 mg via INTRAVENOUS

## 2021-09-29 MED ORDER — CEFUROXIME AXETIL 250 MG PO TABS
250 MG | ORAL_TABLET | Freq: Two times a day (BID) | ORAL | 0 refills | Status: AC
Start: 2021-09-29 — End: 2021-10-06

## 2021-09-29 MED ORDER — SODIUM CHLORIDE 0.9 % IV BOLUS
0.9 % | Freq: Once | INTRAVENOUS | Status: AC
Start: 2021-09-29 — End: 2021-09-29
  Administered 2021-09-29: 18:00:00 1000 mL via INTRAVENOUS

## 2021-09-29 MED ORDER — KETOROLAC TROMETHAMINE 30 MG/ML IJ SOLN
30 MG/ML | Freq: Once | INTRAMUSCULAR | Status: DC
Start: 2021-09-29 — End: 2021-09-29

## 2021-09-29 MED FILL — KETOROLAC TROMETHAMINE 30 MG/ML IJ SOLN: 30 MG/ML | INTRAMUSCULAR | Qty: 1

## 2021-09-29 MED FILL — CEFTRIAXONE SODIUM 1 G IJ SOLR: 1 g | INTRAMUSCULAR | Qty: 1000

## 2021-09-29 NOTE — ED Provider Notes (Signed)
I was not asked to see this patient.  I was available for consultation if deemed necessary.  I was only asked to interpret the EKG.  Please see NP Frank's note for care of the patient while in the emergency department and for final disposition.    The Ekg interpreted by me shows  normal sinus rhythm with a rate of 70  Axis is   Normal  QTc is  normal  Intervals and Durations are unremarkable.      ST Segments: no acute change and nonspecific changes  No significant change from prior EKG dated - 05/10/18  No STEMI          Geralynn Rile, MD  09/29/21 1526

## 2021-09-29 NOTE — Telephone Encounter (Signed)
Pt called stating she was seen in office 6/29 and HCTZ was stopped at that time d/t hyponatremia to 123. She was instructed to double her olmesartan dose, which she has been doing, but BP has become consistently "sky high." This morning BP was 192/146. Also reports consistent daily headaches for almost the past week. Pt instructed to proceed to ED for further evaluation and so that they can administer antihypertensives and monitor her response. Will plan to bring her into the office in the next few days for additional monitoring and medication adjustment. Pt expressed understanding and agreement with plan, will proceed to the ED. Please call pt to schedule her for for first available visit.

## 2021-09-29 NOTE — ED Provider Notes (Incomplete)
[facility]  ED  [address]  [place] [address]  Dept: [phone]  Dept Fax: [phone]  Loc: [phone]    EMERGENCY DEPARTMENT ENCOUNTER        This patient was not seen or evaluated by the attending physician.    I evaluated this patient, the attending physician was available for consultation.    CHIEF COMPLAINT    Chief Complaint   Patient presents with    Headache     Changes to blood pressure medications over the last 1-2 weeks due to low sodium levels, has had headaches daily since last Thursday. BP up.       HPI    Susan [name] is a 56 y.o. female who presents urgency department with her spouse with concerns of having elevated blood pressures over the last couple of weeks.  She had an outpatient office visit had a sodium level resulted at 123 and because of this she reports that she was taken off of her hydrochlorothiazide and increased her hypertensive medication.  She has been checking her blood pressures frequently with a wrist cuff and noted today that it was 196/146 at home.  On arrival she denies lightheadedness or dizziness.  She states she has not all over headache that was slow in onset.  No photophobia.  She is slightly nauseated with no vomiting.  She denies double vision.  She states that she has had problems with low sodiums in the past but she does not know why this happens.  She has been drinking Pedialyte to raise the sodium level at home.  Denies chest pain, shortness of breath, abdominal pain.    REVIEW OF SYSTEMS    Cardiac: No chest pain or palpitations  Respiratory: No shortness of breath or new cough  General: see HPI, no fevers   GI: No abdominal pain or diarrhea, + nausea, no vomiting  Neuro: see HPI, no LOC  All other systems reviewed and are negative.    PAST MEDICAL & SURGICAL HISTORY    Past Medical History:   Diagnosis Date    Arthritis     GERD (gastroesophageal reflux disease)     Hiatal hernia     Hypertension      Thyroid disease     cysts     Past Surgical History:   Procedure Laterality Date    COLONOSCOPY      2011    COLONOSCOPY N/A 02/19/2021    COLONOSCOPY POLYPECTOMY SNARE/COLD BIOPSY performed by Suzy Bouchard, MD at [facility]    ESOPHAGOGASTRODUODENOSCOPY         CURRENT MEDICATIONS    Current Outpatient Rx   Medication Sig Dispense Refill    cefUROXime (CEFTIN) 250 MG tablet Take 1 tablet by mouth 2 times daily for 7 days 14 tablet 0    olmesartan (BENICAR) 20 MG tablet Take 1 tablet by mouth daily 30 tablet 0    thermotabs (MEDI-LYTE) TABS tablet Take 1 tablet by mouth daily 30 tablet 1    hydroCHLOROthiazide (MICROZIDE) 12.5 MG capsule Take 1 capsule by mouth daily 90 capsule 1    levocetirizine (XYZAL) 5 MG tablet Take 1 tablet by mouth nightly 90 tablet 1    montelukast (SINGULAIR) 10 MG tablet Take 1 tablet by mouth nightly 90 tablet 1    pantoprazole (PROTONIX) 40 MG tablet Take 1 tablet by mouth daily 90 tablet 1    rosuvastatin (CRESTOR) 20 MG tablet Take 1  tablet by mouth nightly 90 tablet 1    azelastine HCl 0.15 % SOLN 1 spray by Each Nostril route daily USE 2 SPRAY(S) TWICE DAILY INTRANASALLY AS DIRECTED 23 mL 3    mometasone (NASONEX) 50 MCG/ACT nasal spray 2 sprays by Nasal route daily 3 each 3    glycerin-hypromellose-PEG 400 (DRY EYE RELIEF DROPS) 0.2-0.2-1 % SOLN opthalmic solution Place 1 drop into both eyes as needed (as needed for dry eyes) 1 each 3    aspirin 81 MG EC tablet Take 1 tablet by mouth daily      EPINEPHrine (EPIPEN) 0.3 MG/0.3ML SOAJ injection USE 1 PEN INTRAMUSCULARLY AR NEEDED ONLY FOR SEVERE REACTION FOR 1 DAY         ALLERGIES    Allergies   Allergen Reactions    Food Hives     Sharp cheeses, high mold content food, some nuts    Other Anaphylaxis    Prednazoline Headaches     Prednisones Makes bp rise     Cipro Xr Other (See Comments)     Muscle pain    Sulfa Antibiotics Hives       SOCIAL & FAMILY HISTORY  Social History     Socioeconomic History    Marital status:  Married     Spouse name: None    Number of children: None    Years of education: None    Highest education level: None   Tobacco Use    Smoking status: Every Day     Packs/day: 1.00     Years: 25.00     Pack years: 25.00     Types: Cigarettes     Start date: 1986    Smokeless tobacco: Never   Vaping Use    Vaping Use: Never used   Substance and Sexual Activity    Alcohol use: No    Drug use: Never     Social Determinants of Psychologist, prison and probation services Strain: Low Risk     Difficulty of Paying Living Expenses: Not hard at all   Food Insecurity: No Food Insecurity    Worried About Programme researcher, broadcasting/film/video in the Last Year: Never true    Barista in the Last Year: Never true   Transportation Needs: Unknown    Lack of Transportation (Non-Medical): No   Housing Stability: Unknown    Unstable Housing in the Last Year: No     Family History   Problem Relation Age of Onset    Diabetes Mother         borderline    Kidney Disease Father     Dementia Father        PHYSICAL EXAM    VITAL SIGNS: BP (!) 158/76   Pulse 74   Temp 98 F (36.7 C) (Oral)   Resp 14   Wt 125 lb (56.7 kg)   LMP 07/19/2016   SpO2 98%   BMI 22.50 kg/m   Constitutional:  Well developed, well nourished, no acute distress  Eyes:  Pupils equally round and reactive to light  HENT:  External ears normal, nose normal  NECK: Normal range of motion, no tenderness  Respiratory:  Lungs clear to auscultation bilaterally, no respiratory distress, no wheezing   Cardiovascular:  Regular rate, normal rhythm, no murmurs   GI:  Soft, nontender, nondistended, normal bowel sounds  Musculoskeletal:  No edema, no acute deformities   Integument:  Skin is warm and dry, no obvious rash  Vascular: Radial and DP pulses 2+ equal bilaterally  Neurologic: Awake, alert, oriented to x3, no aphasia, no slurred speech, CN II-XII intact, normal finger to nose test bilaterally, 5/5 strength in all 4 extremities, sensation to light touch intact bilaterally, patellar reflexes 2+  and equal bilaterally  Psych: anxious, cooperative    EKG    I have reviewed and interpreted the EKG with the following findings:  Ventricular rate 70 bpm, PR interval 162 ms, QRS duration 70 ms, QTc 373 ms  No ST elevation or depression  Interpretation is a normal sinus rhythm    Today's tracing was compared to the one taken on May 10, 2018 with no significant changes noted      RADIOLOGY/PROCEDURES    CT HEAD WO CONTRAST   Final Result   1.No acute intracranial abnormality.           Labs Reviewed   CBC WITH AUTO DIFFERENTIAL - Abnormal; Notable for the following components:       Result Value    Platelets 470 (*)     All other components within normal limits   COMPREHENSIVE METABOLIC PANEL - Abnormal; Notable for the following components:    Sodium 126 (*)     Potassium 5.8 (*)     Chloride 93 (*)     All other components within normal limits   URINALYSIS WITH REFLEX TO CULTURE - Abnormal; Notable for the following components:    Blood, Urine MODERATE (*)     Leukocyte Esterase, Urine MODERATE (*)     All other components within normal limits   MICROSCOPIC URINALYSIS - Abnormal; Notable for the following components:    WBC, UA 6-9 (*)     RBC, UA 5-10 (*)     All other components within normal limits   BASIC METABOLIC PANEL - Abnormal; Notable for the following components:    Sodium 129 (*)     Chloride 95 (*)     All other components within normal limits   TROPONIN       ED COURSE & MEDICAL DECISION MAKING    Pertinent Labs & Imaging studies reviewed and interpreted. (See chart for details)    See chart for details of medications given during the ED stay.    Vitals:    09/29/21 1317 09/29/21 1412 09/29/21 1417 09/29/21 1447   BP: (!) 156/80 (!) 142/80 (!) 162/73 (!) 158/76   Pulse: 85  73 74   Resp: 28  11 14    Temp:       TempSrc:       SpO2: 97%  100% 98%   Weight:         Medications   ketorolac (TORADOL) injection 30 mg (30 mg IntraVENous Given 09/29/21 1313)   0.9 % sodium chloride bolus (0 mLs  IntraVENous Stopped 09/29/21 1438)   cefTRIAXone (ROCEPHIN) 1,000 mg in sodium chloride 0.9 % 50 mL IVPB (mini-bag) (0 mg IntraVENous Stopped 09/29/21 1438)       Differential diagnosis: anemia, dehydration, acute coronary syndrome, cardiac arrhythmia, neurologic emergency, metabolic emergency, toxicologic emergency, infection, other    CRITICAL CARE NOTE:  There was a high probability of clinically significant life-threatening deterioration of the patient's condition requiring my urgent intervention.    Total critical care time was at least *** minutes.    This includes vital sign monitoring, pulse oximetry monitoring, telemetry monitoring, clinical response to the IV medications, reviewing the nursing notes, consultation time, dictation/documentation time, and interpretation of the  labwork. This excludes any separately billable procedures performed.  ***The critical care time above also includes time spent obtaining history from ***, as the patient was unable to provide the history AND the history obtained was directly relevant to the care of the patient.     ***Patient is afebrile and nontoxic in appearance. Labs reveal ***no leukocytosis or anemia.  ***Metabolic panel unremarkable.  ***Lipase within normal limits.  ***Urinalysis unremarkable.  CXR findings as above***.  CT findings as above***.    ***EKG interpreted by ED physician.  ***Troponin negative.    Reevaluation at ***: ***Patient is resting comfortably. ***    Consultation with hospitalist at ***: I contacted Dr. Marland Kitchen via [facility] for admission.    1413 Dr. Harlow Ohms      FINAL IMPRESSION    1. Hyponatremia    2. Elevated blood pressure reading with diagnosis of hypertension    3. Acute nonintractable headache, unspecified headache type    4. Urinary tract infection without hematuria, site unspecified        PLAN  ***    (Please note that this note was completed with a voice recognition program.  Every attempt was made to edit the dictations, but  inevitably there remain words that are mis-transcribed.)

## 2021-09-29 NOTE — Discharge Instructions (Addendum)
If you feel like you need to check your blood pressure please only check it twice a day.  Once in the morning and once before bedtime.    Be sure you are sitting with your feet flat on the floor with your legs uncrossed for at least 15 minutes before checking the blood pressure.  Hold very still while your blood pressure monitor is squeezing.  Try to do deep breathing to calm your nerves before checking the pressure.    Wrist cuffs have been known to be an accurate and give false readings.

## 2021-09-29 NOTE — ED Notes (Signed)
Writer reviewed discharge instructions, medications, and follow-up with PCP; patient verbalized understanding. Patient's IV removed with no complications with dressing in place. Patient stable, ambulatory with steady gait, GCS 15, no signs/ symptoms of acute distress, respirations unlabored, and with spouse.      Donnie Mesa, RN  09/29/21 1451

## 2021-09-29 NOTE — Consults (Signed)
1405 - called pt's PCP office per consult  Re: HTN management  1408 - Dr Harlow Ohms called back to speak with NP [facility]

## 2021-09-30 ENCOUNTER — Ambulatory Visit: Admit: 2021-09-30 | Discharge: 2021-09-30 | Payer: BLUE CROSS/BLUE SHIELD | Primary: Family Medicine

## 2021-09-30 ENCOUNTER — Inpatient Hospital Stay: Payer: BLUE CROSS/BLUE SHIELD | Primary: Family Medicine

## 2021-09-30 DIAGNOSIS — E871 Hypo-osmolality and hyponatremia: Secondary | ICD-10-CM

## 2021-09-30 DIAGNOSIS — N390 Urinary tract infection, site not specified: Secondary | ICD-10-CM

## 2021-09-30 LAB — EKG 12-LEAD
Atrial Rate: 70 {beats}/min
P Axis: 53 degrees
P-R Interval: 162 ms
Q-T Interval: 346 ms
QRS Duration: 70 ms
QTc Calculation (Bazett): 373 ms
R Axis: 54 degrees
T Axis: 63 degrees
Ventricular Rate: 70 {beats}/min

## 2021-09-30 MED ORDER — OLMESARTAN MEDOXOMIL 20 MG PO TABS
20 MG | ORAL_TABLET | Freq: Every day | ORAL | 0 refills | Status: AC
Start: 2021-09-30 — End: 2021-10-30

## 2021-09-30 MED ORDER — THERMOTABS PO TABS
ORAL_TABLET | Freq: Every day | ORAL | 1 refills | Status: DC
Start: 2021-09-30 — End: 2021-10-24

## 2021-09-30 NOTE — Progress Notes (Incomplete)
[facility]                               [facility]                                             [address], [address], [place] [address]        Phone: [phone]      Name:  Susan [name]  DOB:    02/03/1966    Consultants:   Patient Care Team:  [name] May, DO as PCP - General (Family Medicine)  Suzy Bouchard, MD as Consulting Physician (Gastroenterology)    Chief Complaint:     Susan [name] is a 56 y.o. female  who presents today for an established patient care visit with Personalized Prevention Plan Services as noted below.    HPI:     Susan [name] is a 56 y.o. female who  has a past medical history of Arthritis, GERD (gastroesophageal reflux disease), Hiatal hernia, Hypertension, and Thyroid disease.   The patient presents today for ***    1. Hypertensive urgency  ***  - olmesartan (BENICAR) 20 MG tablet; Take 1 tablet by mouth daily  Dispense: 30 tablet; Refill: 0    2. Hyponatremia  ***  - SODIUM, URINE, RANDOM; Future  - CREATININE, RANDOM URINE; Future  - OSMOLALITY, URINE; Future  - OSMOLALITY; Future  - Comprehensive Metabolic Panel; Future  - TSH with Reflex to FT4; Future  - thermotabs (MEDI-LYTE) TABS tablet; Take 1 tablet by mouth daily  Dispense: 30 tablet; Refill: 1    3. Urinary tract infection with hematuria, site unspecified  ***        ____________________________________________________________________    ***  Olmesartan 20 mg tabs. Can you return this Thursday.   Stop hctz. Urine and blood tests   Continue your antibiotics.     Write a note to give her a week off.       -yesterday bp was 192/146    -stopped her hctz 12 days ago, only took 2 olmesartan pills.   -She has not done hctz since last visit.   -the very first bp med she went on made her legs swell, this was 3 years ago.   ---stopped this likely amlodipine, was switched to hctz thus around 3 years ago.     ----was originally lisinopril, made her  cough then they switched to olmesartan which stopped cough - two full olmsaratns dropped her bp too much but 1.5 was good.  ---    -sodium runs low chronically, has been told about it for like 7 years.   ---low sodium was never a big deal until an episode 4 years ago, she lost consciousness and was found to have sodium 125 - this was an episode     -before stopping hctz, her sbp regularly was below 130/80 ish; checking her bp every day this week morning and night.   -white coat syndrome here   -it was 161/90s earlier.     -headache both sides. Sodium 129 yesterday.   -no advil or tylenol is helping it.     Chronic hyponatremia.     Come back Thursday morning      -meds different  Pt called stating she was seen in office 6/29 and HCTZ was stopped at that time d/t hyponatremia to 123. She was instructed to double her olmesartan dose, which she has been doing, but BP has become consistently "sky high." This morning BP was 192/146. Also reports consistent daily headaches for almost the past week. Pt instructed to proceed to ED for further evaluation and so that they can administer antihypertensives and monitor her response. Will plan to bring her into the office in the next few days for additional monitoring and medication adjustment. Pt expressed understanding and agreement with plan, will proceed to the ED. Please call pt to schedule her for for first available visit.     Hypertensive emergency  - Patient was in ER yesterday for hypertensive emergency with intractable headache.   - Patient had her HCTZ stopped at her recent visit.  - Was taking olmesartan 7.5 mg and HCTZ 12.5 mg p.o. daily, then because she had hyponatremia at 123 she had her HCTZ held and told to increase olmesartan dose to 10 mg daily  - Check her blood pressure today, see if her headaches are better  - Per 09/19/2021 visit with Dr. Bluford Kaufmann, patient reported that she was on HCTZ well before becoming chronically hyponatremic and was put on HCTZ due  to side effects of leg edema from other blood pressure medication, presumably a calcium channel blocker.  Patient is on ARB and thiazide for this reason.    ===============================    Healthcare maintenance  Low dose CT lung screening &/or counseling (Yearly)  Last ordered: Jun 21, 2021   Please call [facility] Scheduling at [phone] to get this scheduled.     Breast cancer screen (Every 2 Years)  Last ordered: Jun 21, 2021  Please call [facility] Scheduling at [phone] to get this scheduled.     Due for cervical cancer screen  See if she will come in to have it done, otherwise place referral to OB/GYN      ____________________________________________________________________          Current Outpatient Medications   Medication Instructions    aspirin 81 mg, Oral, DAILY    azelastine HCl 0.15 % SOLN 1 spray, Each Nostril, DAILY, USE 2 SPRAY(S) TWICE DAILY INTRANASALLY AS DIRECTED    cefUROXime (CEFTIN) 250 mg, Oral, 2 TIMES DAILY    EPINEPHrine (EPIPEN) 0.3 MG/0.3ML SOAJ injection USE 1 PEN INTRAMUSCULARLY AR NEEDED ONLY FOR SEVERE REACTION FOR 1 DAY    glycerin-hypromellose-PEG 400 (DRY EYE RELIEF DROPS) 0.2-0.2-1 % SOLN opthalmic solution 1 drop, Both Eyes, PRN    hydroCHLOROthiazide (MICROZIDE) 12.5 mg, Oral, DAILY    levocetirizine (XYZAL) 5 mg, Oral, NIGHTLY    mometasone (NASONEX) 50 MCG/ACT nasal spray 2 sprays, Nasal, DAILY    montelukast (SINGULAIR) 10 mg, Oral, NIGHTLY    olmesartan (BENICAR) 20 mg, Oral, DAILY    pantoprazole (PROTONIX) 40 mg, Oral, DAILY    rosuvastatin (CRESTOR) 20 mg, Oral, NIGHTLY    thermotabs (MEDI-LYTE) TABS tablet 1 tablet, Oral, DAILY       PHQ-9 Total Score: 0 (09/30/2021  3:23 PM)  Thoughts that you would be better off dead, or of hurting yourself in some way: 0 (09/30/2021  3:23 PM)    GAD-7 SCREENING 09/30/2021 06/21/2021 04/11/2021   Feeling nervous, anxious, or on edge Not at all Not at all Not at all   Not being able to stop or control  worrying Not at all - Not at all  Worrying too much about different things Not at all Not at all Not at all   Trouble relaxing Not at all Not at all Not at all   Being so restless that it is hard to sit still Not at all Not at all Not at all   Becoming easily annoyed or irritable Not at all Not at all Not at all   Feeling afraid as if something awful might happen Not at all Not at all Not at all   GAD-7 Total Score 0 - 0   How difficult have these problems made it for you to do your work, take care of things at home, or get along with other people? - Not difficult at all Not difficult at all   Feeling nervous, anxious, or on edge - Not at all -   Not able to stop or control worrying - 0-Not at all -   Worrying too much about different things - 0-Not at all -   Trouble relaxing - 0-Not at all -   Being so restless that it's hard to sit still - 0-Not at all -   Becoming easily annoyed or irritable - 0-Not at all -   Feeling afraid as if something awful might happen - 0-Not at all -   GAD-7 Total Score - 0 -       Review of Systems:  All other ROS are negative.    Health Maintenance Completed:  Health Maintenance   Topic Date Due    Cervical cancer screen  Never done    Low dose CT lung screening &/or counseling  Never done    COVID-19 Vaccine (3 - Booster for Pfizer series) 10/18/2019    Breast cancer screen  04/20/2020    Flu vaccine (1) 10/22/2021    Depression Screen  06/22/2022    A1C test (Diabetic or Prediabetic)  09/20/2022    Lipids  09/20/2022    DTaP/Tdap/Td vaccine (2 - Td or Tdap) 07/22/2026    Colorectal Cancer Screen  02/20/2031    Shingles vaccine  Completed    Pneumococcal 0-64 years Vaccine  Completed    Hepatitis C screen  Completed    HIV screen  Completed    Hepatitis A vaccine  Aged Out    Hib vaccine  Aged Out    Meningococcal (ACWY) vaccine  Aged Out    Hepatitis B vaccine  Discontinued      Immunization History   Administered Date(s) Administered    COVID-19, PFIZER PURPLE top, DILUTE for use,  (age 78 y+), 44mcg/0.3mL 08/02/2019, 08/23/2019    Hep B, ENGERIX-B, (age 20y+), IM, 1mL 06/18/2016, 07/21/2016, 12/19/2016    Influenza Virus Vaccine 06/18/2016    Influenza, FLUARIX, FLULAVAL, FLUZONE (age 43 mo+) AND AFLURIA, (age 435 y+), PF, 0.20mL 12/19/2016    Influenza, FLUBLOK, (age 433 y+), PF, 0.19mL 01/21/2021    MMR, PRIORIX, M-M-R II, (age 29m+), SC, 0.59mL 06/27/2016, 07/28/2016    PPD Test 11/10/2016    Pneumococcal, PCV20, PREVNAR 20, (age 18y+), IM, 0.48mL 06/21/2021    TDaP, ADACEL (age 27y-64y), BOOSTRIX (age 10y+), IM, 0.84mL 07/21/2016    Varicella, VARIVAX, (age 434m+), SC, 0.72mL 06/27/2016    Zoster Live (Zostavax) 06/27/2016, 07/28/2016    Zoster Recombinant (Shingrix) 06/21/2021, 09/19/2021     Problem list:  Patient Active Problem List   Diagnosis    Seasonal allergies    Mixed hyperlipidemia    Essential hypertension    Chronic hyponatremia    Family history  of elevated lipoprotein (a)    ETD (eustachian tube dysfunction)    Gastroesophageal reflux disease    Dry eyes    Prediabetes    Weight gain    Tobacco use    Sinus congestion     Past Medical History:    Past Medical History:   Diagnosis Date    Arthritis     GERD (gastroesophageal reflux disease)     Hiatal hernia     Hypertension     Thyroid disease     cysts     Past Surgical History:  Past Surgical History:   Procedure Laterality Date    COLONOSCOPY      2011    COLONOSCOPY N/A 02/19/2021    COLONOSCOPY POLYPECTOMY SNARE/COLD BIOPSY performed by Suzy Bouchard, MD at [facility]    ESOPHAGOGASTRODUODENOSCOPY       Home Meds:  Prior to Visit Medications    Medication Sig Taking? Authorizing Provider   olmesartan (BENICAR) 20 MG tablet Take 1 tablet by mouth daily Yes Teresha Hanks Deeb Abi-Rached, DO   thermotabs (MEDI-LYTE) TABS tablet Take 1 tablet by mouth daily Yes Javan Gonzaga Deeb Abi-Rached, DO   cefUROXime (CEFTIN) 250 MG tablet Take 1 tablet by mouth 2 times daily for 7 days Yes Walker Shadow, APRN - CNP   hydroCHLOROthiazide  (MICROZIDE) 12.5 MG capsule Take 1 capsule by mouth daily Yes Peyton Bottoms, MD PhD   levocetirizine (XYZAL) 5 MG tablet Take 1 tablet by mouth nightly Yes Peyton Bottoms, MD PhD   montelukast (SINGULAIR) 10 MG tablet Take 1 tablet by mouth nightly Yes Peyton Bottoms, MD PhD   pantoprazole (PROTONIX) 40 MG tablet Take 1 tablet by mouth daily Yes Peyton Bottoms, MD PhD   rosuvastatin (CRESTOR) 20 MG tablet Take 1 tablet by mouth nightly Yes Peyton Bottoms, MD PhD   mometasone (NASONEX) 50 MCG/ACT nasal spray 2 sprays by Nasal route daily Yes Magdelene K May, DO   glycerin-hypromellose-PEG 400 (DRY EYE RELIEF DROPS) 0.2-0.2-1 % SOLN opthalmic solution Place 1 drop into both eyes as needed (as needed for dry eyes) Yes Magdelene K May, DO   aspirin 81 MG EC tablet Take 1 tablet by mouth daily Yes Historical Provider, MD   EPINEPHrine (EPIPEN) 0.3 MG/0.3ML SOAJ injection USE 1 PEN INTRAMUSCULARLY AR NEEDED ONLY FOR SEVERE REACTION FOR 1 DAY Yes Historical Provider, MD   azelastine HCl 0.15 % SOLN 1 spray by Each Nostril route daily USE 2 SPRAY(S) TWICE DAILY INTRANASALLY AS DIRECTED  Magdelene K May, DO     Allergies:    Food, Other, Prednazoline, Cipro xr, and Sulfa antibiotics    Family History:       Problem Relation Age of Onset    Diabetes Mother         borderline    Kidney Disease Father     Dementia Father        OBJECTIVE:   Physical Examination:***  General appearance - alert, well appearing, and in no distress and oriented to person, place, and time  Mental status - normal mood, behavior, speech, dress, motor activity, and thought processes  Chest - clear to auscultation, no wheezes, rales or rhonchi, symmetric air entry  Heart - normal rate, regular rhythm, normal S1, S2, no murmurs, rubs, clicks or gallops  Extremities - peripheral pulses normal, no pedal edema, no clubbing or cyanosis.   Skin - normal coloration and turgor, no rashes, no suspicious skin lesions noted   {female adult master:[id number]}  Vitals:    09/30/21 1522    BP: (!) 156/82   Site: Left Upper Arm   Position: Sitting   Cuff Size: Small Adult   Pulse: 86   Resp: 18   Temp: 98.1 F (36.7 C)   TempSrc: Temporal   SpO2: 99%   Weight: 135 lb 9.6 oz (61.5 kg)   Height: 5\' 2"  (1.575 m)     Body mass index is 24.8 kg/m.  Wt Readings from Last 3 Encounters:   09/30/21 135 lb 9.6 oz (61.5 kg)   09/29/21 125 lb (56.7 kg)   09/19/21 136 lb (61.7 kg)     BP Readings from Last 3 Encounters:   09/30/21 (!) 156/82   09/29/21 (!) 158/76   09/19/21 130/70       Lab Review:   {Recent labs:19471::"not applicable"}    Assessment/Plan:  Sharea was seen today for follow-up from hospital.    Diagnoses and all orders for this visit:    Urinary tract infection with hematuria, site unspecified    Hypertensive urgency  -     olmesartan (BENICAR) 20 MG tablet; Take 1 tablet by mouth daily    Hyponatremia  -     SODIUM, URINE, RANDOM; Future  -     CREATININE, RANDOM URINE; Future  -     OSMOLALITY, URINE; Future  -     OSMOLALITY; Future  -     Comprehensive Metabolic Panel; Future  -     TSH with Reflex to FT4; Future  -     thermotabs (MEDI-LYTE) TABS tablet; Take 1 tablet by mouth daily      Susan [name] is a 56 y.o. female who  has a past medical history of Arthritis, GERD (gastroesophageal reflux disease), Hiatal hernia, Hypertension, and Thyroid disease.   The primary encounter diagnosis was Urinary tract infection with hematuria, site unspecified. Diagnoses of Hypertensive urgency and Hyponatremia were also pertinent to this visit.    1. Hypertensive urgency  ***  - olmesartan (BENICAR) 20 MG tablet; Take 1 tablet by mouth daily  Dispense: 30 tablet; Refill: 0    2. Hyponatremia  ***  - SODIUM, URINE, RANDOM; Future  - CREATININE, RANDOM URINE; Future  - OSMOLALITY, URINE; Future  - OSMOLALITY; Future  - Comprehensive Metabolic Panel; Future  - TSH with Reflex to FT4; Future  - thermotabs (MEDI-LYTE) TABS tablet; Take 1 tablet by mouth daily  Dispense: 30 tablet; Refill: 1    3. Urinary  tract infection with hematuria, site unspecified  ***     Healthcare maintenance: ***   Advance Care Planning / Healthcare decision maker: {health care decision maker allegra:[id number]}  Follow up plan: Return to clinic ***    ___________________________________________   ***  The 10-year ASCVD risk score (Arnett DK, et al., 2019) is: 6.5%    Values used to calculate the score:      Age: 30 years      Sex: Female      Is Non-Hispanic African American: No      Diabetic: No      Tobacco smoker: Yes      Systolic Blood Pressure: 156 mmHg      Is BP treated: Yes      HDL Cholesterol: 47 mg/dL      Total Cholesterol: 130 mg/dL    The AAFP highlights the following key recommendations from the 2018 ACC/AHA guideline:    Patients with LDL cholesterol greater than or equal to 190  mg/dL or triglycerides greater than or equal to 500 mg/dL should be evaluated for secondary causes of hyperlipidemia.  A heart-healthy lifestyle should be emphasized for all individuals; lifestyle therapy should be the primary intervention for patients with metabolic syndrome.  Treatment thresholds:  Adults 21 and older with a primary LDL cholesterol level greater than or equal to 190 mg/dL should be treated with high-intensity statin therapy unless contraindicated.  Adults ages 58-75 who have an LDL cholesterol level of 70-189 mg/dL without clinical ASCVD or diabetes and an estimated 10-year ASCVD risk of 7.5 percent or greater should be treated with moderate- to high-intensity statin therapy.  Adults ages 82-75 with an LDL cholesterol level of 70-189 mg/dL without clinical ASCVD or diabetes and an estimated 10-year ASCVD risk between 5 percent and 7.4 percent may consider moderate-intensity statin therapy if additional risk factors are present; the decision to treat should include a risk-benefit discussion between the patient and clinician.  Adults ages 72-75 with diabetes and an LDL cholesterol level of 70-189 mg/dL should be treated with  moderate-intensity statin therapy.  Patients 75 and younger who have clinical ASCVD should be treated with high-intensity statin therapy unless contraindicated.  In adults at very high risk for ASCVD (e.g., those with a history of multiple major ASCVD events or one major ASCVD event and multiple high-risk conditions), adding a non-statin may be considered at an LDL cholesterol threshold of 70 mg/dL.  Note that the ACC/AHA guideline's recommendation to start statin therapy in patients ages 26-75 who have an LDL cholesterol level of 70-189 mg/dL without clinical ASCVD or diabetes and an estimated 10-year ASCVD risk of 7.5 percent or greater differs from the USPSTF's recommendation for these patients which is >10.0 percent.  ***  ___________________________________________    Health Maintenance: (USPSTF Recommendations)  Female:   Breast Cancer Screen: (40-49 (C), 50-74 biennial screening mammogram (B)):   Cervical Cancer Screen: (21-29 q15yr cytology alone; 30-65 q70yr cytology alone, q17yr with hrHPV alone, or q41yr cytology+hrHPV (A)):   DEXA Screen: (women >65 and older, <65 if at risk/postmenopausal (B)):   Female:   Prostate Cancer Screen: (34-69 yo discuss benefits/harm, does not recommend testing PSA in men >32 yo (D):    AAA Screen: (men 47-75 yo who has ever smoked (B), consider in nonsmokers if high risk):   All adults:  CRC/Colonoscopy Screening: (adults 80-49 (B), 50-75 (A)):   Lung Ca Screening: Annual LDCT (+smoker age 38-80, smoked within 15 years, total of 20 pack yr history (B)):   Serum screens:  HIV Screen: (75-65 yr old, and all pregnant patients (A)):   Hep C Screen: (18-79 yr old (B)):  HCC Screen: (all pts with cirrhosis and high risk Hep B ([place] q6 mo)):  Immunizations:   discussed with patient any immunizations they are due for per USPSTF and other established guidelines; any immunizations ordered today are listed above in assessment and plan   ___________________________________________    Health  Maintenance Due:  Health Maintenance Due   Topic Date Due    Cervical cancer screen  Never done    Low dose CT lung screening &/or counseling  Never done    COVID-19 Vaccine (3 - Booster for Pfizer series) 10/18/2019    Breast cancer screen  04/20/2020        RTC:  Return in about 5 days (around 10/05/2021).     EMR Dragon/transcription disclaimer:  Much of this encounter note is electronic transcription/translation of spoken language to printed texts.  The electronic  translation of spoken language may be erroneous, or at times, nonsensical words or phrases may be inadvertently transcribed.  Although I have reviewed the note for such errors, some may still exist.     ___________________________________________  Electronically signed by Norwood Levo, DO  PGY-2, Family and Community Medicine Resident

## 2021-09-30 NOTE — Other (Signed)
Please notify patient the following regarding their results:    Patient with hyponatremia but improved at 133 compared to 129 the day before. Serum osmolality is measured as normal but corrected calculated at 273. Thus patient has hypotonic hyponatremia.   Patient was euvolemic on clinical exam. Patient has urinary sodium >20 at 46 mmol/L. Patient with urinary osmolality >100 at 168 mOsm/kg. 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     Please inform patient that the next step is to try fluid restriction with her. Please tell her not to drink more than 1 Liter of water per day until her appointment. In other words, two 12 oz bottles of water maximum. This fluid restriction is intense but it will help [place] to reassess her hyponatremia when she comes in, where we will have another BMP for her to get checked and then we will determine the next best step.

## 2021-09-30 NOTE — Telephone Encounter (Signed)
Patient scheduled

## 2021-10-01 ENCOUNTER — Telehealth

## 2021-10-01 LAB — COMPREHENSIVE METABOLIC PANEL
ALT: 12 U/L (ref 10–40)
AST: 17 U/L (ref 15–37)
Albumin/Globulin Ratio: 1.8 (ref 1.1–2.2)
Albumin: 4.7 g/dL (ref 3.4–5.0)
Alkaline Phosphatase: 51 U/L (ref 40–129)
Anion Gap: 12 (ref 3–16)
BUN: 6 mg/dL — ABNORMAL LOW (ref 7–20)
CO2: 24 mmol/L (ref 21–32)
Calcium: 9.8 mg/dL (ref 8.3–10.6)
Chloride: 97 mmol/L — ABNORMAL LOW (ref 99–110)
Creatinine: 0.7 mg/dL (ref 0.6–1.1)
Est, Glom Filt Rate: >60 (ref >60)
Glucose: 84 mg/dL (ref 70–99)
Potassium: 4.3 mmol/L (ref 3.5–5.1)
Sodium: 133 mmol/L — ABNORMAL LOW (ref 136–145)
Total Bilirubin: <0.2 mg/dL (ref 0.0–1.0)
Total Protein: 7.3 g/dL (ref 6.4–8.2)

## 2021-10-01 LAB — TSH WITH REFLEX TO FT4: TSH Reflex FT4: 1.4 u[IU]/mL (ref 0.27–4.20)

## 2021-10-01 LAB — OSMOLALITY: Osmolality: 285 mOsm/kg (ref 275–295)

## 2021-10-01 LAB — OSMOLALITY, URINE: Osmolality, Ur: 168 mOsm/kg — ABNORMAL LOW (ref 390–1070)

## 2021-10-01 LAB — SODIUM, URINE, RANDOM: Sodium, Ur: 46 mmol/L

## 2021-10-01 LAB — CREATININE, RANDOM URINE: Creatinine, Ur: 23 mg/dL — ABNORMAL LOW (ref 28.0–259.0)

## 2021-10-01 NOTE — Telephone Encounter (Signed)
Susan [name] is a 56 y.o. female evaluated via telephone on 10/01/2021 for lab result follow up.     Documentation:  I communicated with the patient and/or health care decision maker about lab results and patient's BP and symptoms and medications.     Details of this discussion including any medical advice provided:   - Still headache but better now.  No blurry vision.   - Went through lab results.   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   - Told her to start salt tablets with two 12 fl oz water bottles a day, plus olmesartan 20 mg po daily. Also check BP twice a day. Return precautions to ER discussed - if sBP goes above 170/180 and pt has worsening headaches or vision blur or chest pain.   - Otherwise we will see patient on Thursday 10-03-2021 at 10:30 am with Dr. Arne Anthony.        Total Time: minutes: 11-20 minutes    Susan [name] was evaluated through a synchronous (real-time) audio encounter. Patient identification was verified at the start of the visit. She (or guardian if applicable) is aware that this is a billable service, which includes applicable co-pays. This visit was conducted with the patient's (and/or legal guardian's) verbal consent. She has not had a related appointment within my department in the past 7 days or scheduled within the next 24 hours.   The patient was located at Home: [address]  [place] [address].  The provider was located at [employer] (Appt Dept): [address]  [address]  [place],  [place] [address].  Note: not billable if this call serves to triage the patient into an appointment for the relevant concern     Brallan Denio Deeb Abi-Rached, DO

## 2021-10-01 NOTE — Progress Notes (Deleted)
Susan [name] is a 56 y.o. female evaluated via telephone on 10/01/2021 for lab result follow up.       Documentation:  I communicated with the patient and/or health care decision maker about lab results and patient's BP and symptoms and medications.   Details of this discussion including any medical advice provided:     Still headache but better now.  No blurry vision.   Went through lab results.   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   Told her to start salt tablets with two 12 fl oz water bottles a day, plus olmesartan 20 mg po daily. Also check BP twice a day. Return precautions to ER discussed - if sBP goes above 170/180 and pt has worsening headaches or vision blur or chest pain. Otherwise we will see patient on Thursday 10-03-2021 at 10:30 am with Dr. Arne Chuichu.       Total Time: minutes: 11-20 minutes    [name] was evaluated through a synchronous (real-time) audio encounter. Patient identification was verified at the start of the visit. She (or guardian if applicable) is aware that this is a billable service, which includes applicable co-pays. This visit was conducted with the patient's (and/or legal guardian's) verbal consent. She has not had a related appointment within my department in the past 7 days or scheduled within the next 24 hours.   The patient was located at Home: [address]  [place] [address].  The provider was located at [employer] (Appt Dept): [address]  [address]  [place],  [place] [address].    Note: not billable if this call serves to triage the patient into an appointment for the relevant concern    [name], DO

## 2021-10-01 NOTE — Telephone Encounter (Signed)
Please see pt call about bp and medication

## 2021-10-01 NOTE — Telephone Encounter (Signed)
Patient, Susan [name], called to state that her BP is 153/ 87. Patient stated that they need to know if they can start taking the thermotabs (MEDI-LYTE) TABS tablet yet. Patient was told to wait until result are back from the lab test for sodium. Patient stated that they still have a headache since taken of off the Water pill.  Please call the patient if they can start the medicine,  Shalika Arntz  [phone] (home)

## 2021-10-03 ENCOUNTER — Ambulatory Visit: Admit: 2021-10-03 | Discharge: 2021-10-03 | Payer: BLUE CROSS/BLUE SHIELD | Primary: Family Medicine

## 2021-10-03 DIAGNOSIS — E222 Syndrome of inappropriate secretion of antidiuretic hormone: Secondary | ICD-10-CM

## 2021-10-03 MED ORDER — NAPROXEN 500 MG PO TABS
500 MG | ORAL_TABLET | Freq: Two times a day (BID) | ORAL | 0 refills | Status: DC
Start: 2021-10-03 — End: 2023-01-09

## 2021-10-03 MED ORDER — OLMESARTAN MEDOXOMIL 40 MG PO TABS
40 MG | ORAL_TABLET | Freq: Every day | ORAL | 0 refills | Status: AC
Start: 2021-10-03 — End: 2021-11-02

## 2021-10-03 NOTE — Progress Notes (Cosign Needed)
[facility]                               Family and [facility]                                             [address], [place] [address]        Phone: [phone]      Name:  Susan [name]  DOB:    1966-02-21    Consultants:   Patient Care Team:  [name] May, DO as PCP - General (Family Medicine)  Suzy Bouchard, MD as Consulting Physician (Gastroenterology)    Chief Complaint:     Susan [name] is a 56 y.o. female  who presents today for an established patient care visit with Personalized Prevention Plan Services as noted below.    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     SIADH/Hyponatremia  Patient notes that she has only been fluid restricting since yesterday (Wednesday). She is wondering if she can have more fluids given she is dehydrated throughout the day and is fluid restricted to 24 ounces daily for now. She has been taking her salt tablets and states that the taste does not bother her. She continues to use Pedialyte. She notes her longstanding history of chronic hyponatremia dating back for the last 6 years, but potentially to 2011. She continues to note decreased urination and leg cramps at today's visit. She was previously stopped on HCTZ as thought to be likely etiology of SIADH.     Hypertension/Hx of HTN Urgency/Headaches   Patient recently went to the ED for HTN Urgency. She was given IV Toradol for her headache at that time too. Patient brought her BP log in today, please see below. She continues to have headaches after stopping her HCTZ. She has been managing with 2,600 mg of Tylenol daily, which provides her mild relief. She notes that headaches are constant, described as aching/pulsating, and are usually posterior/cervical region. In total, symptoms have been ongoing for the last 2 weeks.      Tuesday Evening - 144/83  Wednesday Morning - 139/86  Wednesday Morning - 155/93  Wednesday Afternoon - 153/87  Wednesday Evening - 181/92  Wednesday Evening - 156/91  Wednesday Evening - 164/95  Thursday Morning - 159/92    Tobacco Use Disorder  Patient notes that she continues to smoke 0.5 packs per day for approximately the last 20 years. She continues to have chronic cough which may or may not be attributable to this. She had a prior CT Lung Screen ordered but states she has yet to obtain it.       Patient Active Problem List   Diagnosis    Seasonal allergies    Mixed hyperlipidemia    Essential hypertension    Chronic hyponatremia    Family history of elevated lipoprotein (a)    ETD (eustachian tube dysfunction)    Gastroesophageal reflux disease    Dry eyes    Prediabetes    Weight gain    Tobacco use    Sinus congestion  Past Medical History:    Past Medical History:   Diagnosis Date    Arthritis     GERD (gastroesophageal reflux disease)     Hiatal hernia     Hypertension     Thyroid disease     cysts       Past Surgical History:  Past Surgical History:   Procedure Laterality Date    COLONOSCOPY      2011    COLONOSCOPY N/A 02/19/2021    COLONOSCOPY POLYPECTOMY SNARE/COLD BIOPSY performed by Suzy Bouchard, MD at [facility]    ESOPHAGOGASTRODUODENOSCOPY         Home Meds:  Prior to Visit Medications    Medication Sig Taking? Authorizing Provider   olmesartan (BENICAR) 40 MG tablet Take 1 tablet by mouth daily Yes Drake Leach, DO   naproxen (NAPROSYN) 500 MG tablet Take 1 tablet by mouth 2 times daily (with meals) Yes Drake Leach, DO   thermotabs (MEDI-LYTE) TABS tablet Take 1 tablet by mouth daily Yes Johnny Deeb Abi-Rached, DO   cefUROXime (CEFTIN) 250 MG tablet Take 1 tablet by mouth 2 times daily for 7 days Yes Walker Shadow, APRN - CNP   levocetirizine (XYZAL) 5 MG tablet Take 1 tablet by mouth nightly Yes Peyton Bottoms, MD PhD   montelukast (SINGULAIR) 10 MG tablet Take 1  tablet by mouth nightly Yes Peyton Bottoms, MD PhD   pantoprazole (PROTONIX) 40 MG tablet Take 1 tablet by mouth daily Yes Peyton Bottoms, MD PhD   rosuvastatin (CRESTOR) 20 MG tablet Take 1 tablet by mouth nightly Yes Peyton Bottoms, MD PhD   azelastine HCl 0.15 % SOLN 1 spray by Each Nostril route daily USE 2 SPRAY(S) TWICE DAILY INTRANASALLY AS DIRECTED Yes Magdelene K May, DO   mometasone (NASONEX) 50 MCG/ACT nasal spray 2 sprays by Nasal route daily Yes Magdelene K May, DO   glycerin-hypromellose-PEG 400 (DRY EYE RELIEF DROPS) 0.2-0.2-1 % SOLN opthalmic solution Place 1 drop into both eyes as needed (as needed for dry eyes) Yes Magdelene K May, DO   aspirin 81 MG EC tablet Take 1 tablet by mouth daily Yes Historical Provider, MD   EPINEPHrine (EPIPEN) 0.3 MG/0.3ML SOAJ injection USE 1 PEN INTRAMUSCULARLY AR NEEDED ONLY FOR SEVERE REACTION FOR 1 DAY Yes Historical Provider, MD       Allergies:    Food, Other, Prednazoline, Cipro xr, and Sulfa antibiotics    Family History:       Problem Relation Age of Onset    Diabetes Mother         borderline    Kidney Disease Father     Dementia Father          Health Maintenance Completed:  Health Maintenance   Topic Date Due    Cervical cancer screen  Never done    Low dose CT lung screening &/or counseling  Never done    COVID-19 Vaccine (3 - Booster for Pfizer series) 10/18/2019    Breast cancer screen  04/20/2020    Flu vaccine (1) 10/22/2021    A1C test (Diabetic or Prediabetic)  09/20/2022    Lipids  09/20/2022    Depression Screen  10/04/2022    DTaP/Tdap/Td vaccine (2 - Td or Tdap) 07/22/2026    Colorectal Cancer Screen  02/20/2031    Shingles vaccine  Completed    Pneumococcal 0-64 years Vaccine  Completed    Hepatitis C screen  Completed    HIV screen  Completed  Hepatitis A vaccine  Aged Out    Hib vaccine  Aged Out    Meningococcal (ACWY) vaccine  Aged Out    Hepatitis B vaccine  Discontinued          Immunization History   Administered Date(s) Administered    COVID-19,  PFIZER PURPLE top, DILUTE for use, (age 17 y+), 13mcg/0.3mL 08/02/2019, 08/23/2019    Hep B, ENGERIX-B, (age 20y+), IM, 1mL 06/18/2016, 07/21/2016, 12/19/2016    Influenza Virus Vaccine 06/18/2016    Influenza, FLUARIX, FLULAVAL, FLUZONE (age 30 mo+) AND AFLURIA, (age 71 y+), PF, 0.69mL 12/19/2016    Influenza, FLUBLOK, (age 89 y+), PF, 0.82mL 01/21/2021    MMR, PRIORIX, M-M-R II, (age 97m+), SC, 0.39mL 06/27/2016, 07/28/2016    PPD Test 11/10/2016    Pneumococcal, PCV20, PREVNAR 20, (age 18y+), IM, 0.16mL 06/21/2021    TDaP, ADACEL (age 46y-64y), BOOSTRIX (age 10y+), IM, 0.51mL 07/21/2016    Varicella, VARIVAX, (age 56m+), SC, 0.79mL 06/27/2016    Zoster Live (Zostavax) 06/27/2016, 07/28/2016    Zoster Recombinant (Shingrix) 06/21/2021, 09/19/2021         Review of Systems:  Review of Systems   Constitutional:  Negative for chills, diaphoresis, fatigue and fever.   HENT:  Positive for sinus pressure and sinus pain. Negative for congestion, postnasal drip, rhinorrhea and sneezing.    Eyes:  Positive for visual disturbance.   Respiratory:  Positive for cough. Negative for chest tightness, shortness of breath and wheezing.    Cardiovascular:  Negative for chest pain and palpitations.   Gastrointestinal:  Negative for abdominal pain, constipation, diarrhea, nausea and vomiting.   Musculoskeletal:  Positive for neck pain. Negative for arthralgias, back pain, myalgias and neck stiffness.   Neurological:  Positive for headaches. Negative for dizziness, tremors, seizures, syncope, weakness, light-headedness and numbness.        Physical Exam:   Vitals:    10/03/21 1040   BP: (!) 154/88   Site: Left Upper Arm   Position: Sitting   Cuff Size: Medium Adult   Pulse: 93   SpO2: 98%   Weight: 134 lb 3.2 oz (60.9 kg)     Body mass index is 24.55 kg/m.    Wt Readings from Last 3 Encounters:   10/03/21 134 lb 3.2 oz (60.9 kg)   09/30/21 135 lb 9.6 oz (61.5 kg)   09/29/21 125 lb (56.7 kg)       BP Readings from Last 3 Encounters:    10/03/21 (!) 154/88   09/30/21 (!) 156/82   09/29/21 (!) 158/76       Physical Exam  Constitutional:       General: She is not in acute distress.     Appearance: Normal appearance. She is normal weight. She is not ill-appearing, toxic-appearing or diaphoretic.   HENT:      Head: Normocephalic and atraumatic.   Eyes:      General: No scleral icterus.        Right eye: No discharge.         Left eye: No discharge.      Extraocular Movements: Extraocular movements intact.      Conjunctiva/sclera: Conjunctivae normal.      Pupils: Pupils are equal, round, and reactive to light.   Cardiovascular:      Rate and Rhythm: Normal rate and regular rhythm.      Pulses: Normal pulses.      Heart sounds: Normal heart sounds. No murmur heard.    No friction  rub. No gallop.   Pulmonary:      Effort: Pulmonary effort is normal. No respiratory distress.      Breath sounds: Normal breath sounds. No stridor. No wheezing, rhonchi or rales.   Musculoskeletal:         General: No deformity or signs of injury.      Cervical back: Neck supple.      Right lower leg: No edema.      Left lower leg: No edema.   Skin:     General: Skin is warm and dry.      Capillary Refill: Capillary refill takes less than 2 seconds.   Neurological:      General: No focal deficit present.      Mental Status: She is alert and oriented to person, place, and time. Mental status is at baseline.   Psychiatric:         Mood and Affect: Mood normal.         Behavior: Behavior normal.        Lab Review:   Hospital Outpatient Visit on 09/30/2021   Component Date Value    TSH Reflex FT4 09/30/2021 1.40     Sodium, Ur 09/30/2021 46     Creatinine, Ur 09/30/2021 23.0 (L)     Osmolality, Ur 09/30/2021 168 (L)     Osmolality 09/30/2021 285     Sodium 09/30/2021 133 (L)     Potassium 09/30/2021 4.3     Chloride 09/30/2021 97 (L)     CO2 09/30/2021 24     Anion Gap 09/30/2021 12     Glucose 09/30/2021 84     BUN 09/30/2021 6 (L)     Creatinine 09/30/2021 0.7     Est, Glom  Filt Rate 09/30/2021 >60     Calcium 09/30/2021 9.8     Total Protein 09/30/2021 7.3     Albumin 09/30/2021 4.7     Albumin/Globulin Ratio 09/30/2021 1.8     Total Bilirubin 09/30/2021 <0.2     Alkaline Phosphatase 09/30/2021 51     ALT 09/30/2021 12     AST 09/30/2021 17    Admission on 09/29/2021, Discharged on 09/29/2021   Component Date Value    WBC 09/29/2021 7.7     RBC 09/29/2021 4.23     Hemoglobin 09/29/2021 13.5     Hematocrit 09/29/2021 40.0     MCV 09/29/2021 94.7     MCH 09/29/2021 32.0     MCHC 09/29/2021 33.8     RDW 09/29/2021 14.0     Platelets 09/29/2021 470 (H)     MPV 09/29/2021 6.6     Neutrophils % 09/29/2021 72.6     Lymphocytes % 09/29/2021 17.8     Monocytes % 09/29/2021 7.7     Eosinophils % 09/29/2021 0.8     Basophils % 09/29/2021 1.1     Neutrophils Absolute 09/29/2021 5.5     Lymphocytes Absolute 09/29/2021 1.4     Monocytes Absolute 09/29/2021 0.6     Eosinophils Absolute 09/29/2021 0.1     Basophils Absolute 09/29/2021 0.1     Sodium 09/29/2021 126 (L)     Potassium 09/29/2021 5.8 (H)     Chloride 09/29/2021 93 (L)     CO2 09/29/2021 22     Anion Gap 09/29/2021 11     Glucose 09/29/2021 93     BUN 09/29/2021 7     Creatinine 09/29/2021 0.6     Est, Glom Filt Rate 09/29/2021 >60  Calcium 09/29/2021 9.2     Total Protein 09/29/2021 7.2     Albumin 09/29/2021 4.2     Albumin/Globulin Ratio 09/29/2021 1.4     Total Bilirubin 09/29/2021 0.4     Alkaline Phosphatase 09/29/2021 44     ALT 09/29/2021 13     AST 09/29/2021 29     Color, UA 09/29/2021 Yellow     Clarity, UA 09/29/2021 Clear     Glucose, Ur 09/29/2021 Negative     Bilirubin Urine 09/29/2021 Negative     Ketones, Urine 09/29/2021 Negative     Specific Gravity, UA 09/29/2021 1.010     Blood, Urine 09/29/2021 MODERATE (A)     pH, UA 09/29/2021 7.0     Protein, UA 09/29/2021 Negative     Urobilinogen, Urine 09/29/2021 0.2     Nitrite, Urine 09/29/2021 Negative     Leukocyte Esterase, Urine 09/29/2021 MODERATE (A)     Microscopic  Examination 09/29/2021 YES     Urine Type 09/29/2021 NotGiven     Urine Reflex to Culture 09/29/2021 Not Indicated     Troponin, High Sensitivi* 09/29/2021 <6     Ventricular Rate 09/29/2021 70     Atrial Rate 09/29/2021 70     P-R Interval 09/29/2021 162     QRS Duration 09/29/2021 70     Q-T Interval 09/29/2021 346     QTc Calculation (Bazett) 09/29/2021 373     P Axis 09/29/2021 53     R Axis 09/29/2021 54     T Axis 09/29/2021 63     Diagnosis 09/29/2021 Normal sinus rhythmWhen compared with ECG of 10-May-2018 15:16,No significant change was foundConfirmed by HAUGER MD, DEBORAH ([id number]) on 09/29/2021 9:15:03 PM     WBC, UA 09/29/2021 6-9 (A)     RBC, UA 09/29/2021 5-10 (A)     Epithelial Cells, UA 09/29/2021 0-1     Sodium 09/29/2021 129 (L)     Potassium 09/29/2021 4.1     Chloride 09/29/2021 95 (L)     CO2 09/29/2021 24     Anion Gap 09/29/2021 10     Glucose 09/29/2021 93     BUN 09/29/2021 7     Creatinine 09/29/2021 0.6     Est, Glom Filt Rate 09/29/2021 >60     Calcium 09/29/2021 9.6    Office Visit on 09/19/2021   Component Date Value    WBC 09/19/2021 7.4     RBC 09/19/2021 4.02     Hemoglobin 09/19/2021 13.0     Hematocrit 09/19/2021 37.9     MCV 09/19/2021 94.2     MCH 09/19/2021 32.2     MCHC 09/19/2021 34.2     RDW 09/19/2021 13.8     Platelets 09/19/2021 420     MPV 09/19/2021 7.0     Neutrophils % 09/19/2021 60.0     Lymphocytes % 09/19/2021 30.9     Monocytes % 09/19/2021 6.5     Eosinophils % 09/19/2021 1.9     Basophils % 09/19/2021 0.7     Neutrophils Absolute 09/19/2021 4.4     Lymphocytes Absolute 09/19/2021 2.3     Monocytes Absolute 09/19/2021 0.5     Eosinophils Absolute 09/19/2021 0.1     Basophils Absolute 09/19/2021 0.1     Sodium 09/19/2021 123 (L)     Potassium 09/19/2021 4.4     Chloride 09/19/2021 86 (L)     CO2 09/19/2021 23     Anion Gap 09/19/2021 14  Glucose 09/19/2021 86     BUN 09/19/2021 5 (L)     Creatinine 09/19/2021 0.6     Est, Glom Filt Rate 09/19/2021 >60     Calcium  09/19/2021 9.6     Total Protein 09/19/2021 6.7     Albumin 09/19/2021 4.6     Albumin/Globulin Ratio 09/19/2021 2.2     Total Bilirubin 09/19/2021 0.5     Alkaline Phosphatase 09/19/2021 53     ALT 09/19/2021 13     AST 09/19/2021 19     Hemoglobin A1C 09/19/2021 6.0     eAG 09/19/2021 125.5     Cholesterol, Total 09/19/2021 130     Triglycerides 09/19/2021 103     HDL 09/19/2021 47     LDL Calculated 09/19/2021 62     VLDL Cholesterol Calcula* 09/19/2021 21           Assessment/Plan:  Patient is a 56 year old Female who was last seen in our office 3 days ago for 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     SIADH/Hyponatremia  Not controlled, however improving   Continue with current salt tablets knowing that this may be also contributing to patient's HTN.   Continue with fluid restriction. Increased to 34-36 ounces (about 1L) daily for now.   Re-ordered BMP, Urine Osmolality, Serum Osmolality, Urine Sodium, and Urine Cr today. Patient to obtain 2 days prior to her next visit in 2 weeks.   Will be able to better assess if SIADH truly iatrogenic or if other etiology the cause.   RTC in 2 weeks to re-evaluate.     Hypertension/Hx of HTN Urgency/Headaches   Not controlled, not at goal. BP slightly elevated at 154/88  Will continue to treat headaches as 2/2 to persistent HTN.   Prescribed Naproxen 500 mg BID for 7 days to aid in symptomatic headache relief.   Also increased patient's Olmesartan to 40 mg daily in order to improve BP control if this is the etiology of patient's headache.   If not symptomatic improvement in the next 2 weeks, will need to explore other etiologies of headaches including intractable migraines.   Advised patient that if symptoms significantly worsening or the patient develops worsening headache or visual disturbances, to seek care at ED or schedule office visit sooner for evaluation.   RTC in 2 weeks to re-evaluate.     Tobacco Use Disorder  Not controlled, not at goal.    Patient pre-contemplative at this point  Continued to encourage tobacco cessation efforts.   RTC in 3-6 months to re-evaluate.         Health Maintenance Due:  Health Maintenance Due   Topic Date Due    Cervical cancer screen  Never done    Low dose CT lung screening &/or counseling  Never done    COVID-19 Vaccine (3 - Booster for Pfizer series) 10/18/2019    Breast cancer screen  04/20/2020          Health care decision maker:  <23 years old      Health Maintenance: (USPSTF Recommendations)  (F) Breast Cancer Screen: (40-49 (C), 50-74 biennial screening mammogram (B))  (F) Cervical Cancer Screen: (21-29 q21yr cytology alone; 30-65 q40yr cytology alone, q57yr with hrHPV alone, or q55yr cytology+hrHPV (A))  CRC/Colonoscopy Screening: (adults 45-49 (B), 50-75 (A))  Lung Ca Screening: Annual LDCT (+smoker age 62-80, smoked within 15 years, total of 20 pack yr history (B)):  DEXA Screen: (women >65 and older, <  65 if at risk/postmenopausal (B))  HIV Screen: (54-65 yr old, and all pregnant patients (A)):  Hep C Screen: (18-79 yr old (B)):  HCC Screen: (all pts with cirrhosis and high risk Hep B ([place] q6 mo)):  Immunizations:    RTC:  Return in about 2 weeks (around 10/17/2021).     EMR Dragon/transcription disclaimer:  Much of this encounter note is electronic transcription/translation of spoken language to printed texts.  The electronic translation of spoken language may be erroneous, or at times, nonsensical words or phrases may be inadvertently transcribed.  Although I have reviewed the note for such errors, some may still exist.

## 2021-10-08 ENCOUNTER — Inpatient Hospital Stay: Admit: 2021-10-08 | Payer: BLUE CROSS/BLUE SHIELD | Primary: Family Medicine

## 2021-10-08 DIAGNOSIS — Z720 Tobacco use: Secondary | ICD-10-CM

## 2021-10-14 ENCOUNTER — Ambulatory Visit: Admit: 2021-10-14 | Discharge: 2021-10-14 | Payer: BLUE CROSS/BLUE SHIELD | Primary: Family Medicine

## 2021-10-14 ENCOUNTER — Inpatient Hospital Stay: Payer: BLUE CROSS/BLUE SHIELD | Primary: Family Medicine

## 2021-10-14 DIAGNOSIS — E222 Syndrome of inappropriate secretion of antidiuretic hormone: Secondary | ICD-10-CM

## 2021-10-14 DIAGNOSIS — I10 Essential (primary) hypertension: Secondary | ICD-10-CM

## 2021-10-14 LAB — BASIC METABOLIC PANEL
Anion Gap: 14 (ref 3–16)
BUN: 8 mg/dL (ref 7–20)
CO2: 24 mmol/L (ref 21–32)
Calcium: 10 mg/dL (ref 8.3–10.6)
Chloride: 98 mmol/L — ABNORMAL LOW (ref 99–110)
Creatinine: 0.7 mg/dL (ref 0.6–1.1)
Est, Glom Filt Rate: >60 (ref >60)
Glucose: 93 mg/dL (ref 70–99)
Potassium: 4.7 mmol/L (ref 3.5–5.1)
Sodium: 136 mmol/L (ref 136–145)

## 2021-10-14 MED ORDER — METOPROLOL SUCCINATE ER 25 MG PO TB24
25 MG | ORAL_TABLET | Freq: Every day | ORAL | 0 refills | Status: AC
Start: 2021-10-14 — End: 2021-10-24

## 2021-10-14 NOTE — Progress Notes (Cosign Needed)
PROGRESS NOTE   [facility]                                  [address], [address], [place] [address]         Phone: [phone]    Date of Service:  10/14/2021     Patient ID: .Susan [name] is a 56 y.o. female      Subjective:     CC: Susan [name] is a 56 y.o. female who presents today for an established care visit.    HPI  The patient has a history of HTN, GERD, Mixed hyperlipidemia, prediabetes, and chronic hyponatremia who presents today for a follow-up on her hypertension.     The patient states that her headaches occur daily, and the pain is described as throbbing. She feels like the pain is all over her head. Her blood pressure is elevated during her headaches. These headaches occurred around the time she stopped her HCTZ.    Hypertension: Home blood pressure monitoring: Yes. Patient complains of headache, nausea, visual changes, dizziness - and denies chest pain, SOB, palpitations, orthopnea. Antihypertensive medication side effects: no medication side effects noted.                    ROS:    Constitutional:  Negative for activity changes, positive for appetite change  Eyes:  Negative for eye pain, positive for visual changes  Resp:  Negative for SOB, cough  Cardiovascular: Negative for CP, palpitations, orthopnea  Gastrointestinal:  Positive for Nausea, Negative for Vomiting and Diarrhea  Neuro:  Positive for dizziness or lightheadedness        Vitals:    10/14/21 0909 10/14/21 0916   BP: (!) 144/82 (!) 150/80   Pulse: 68    Resp: 16    SpO2: 98%    Weight: 134 lb 3.2 oz (60.9 kg)    Height: 5' 2.5" (1.588 m)        Allergies:  Food, Other, Prednazoline, Cipro xr, and Sulfa antibiotics    Outpatient Medications Marked as Taking for the 10/14/21 encounter (Office Visit) with Reed Pandy, DO   Medication Sig Dispense Refill    metoprolol succinate (TOPROL XL) 25 MG extended release tablet Take 1 tablet by mouth daily  30 tablet 0    olmesartan (BENICAR) 40 MG tablet Take 1 tablet by mouth daily 30 tablet 0    naproxen (NAPROSYN) 500 MG tablet Take 1 tablet by mouth 2 times daily (with meals) 14 tablet 0    thermotabs (MEDI-LYTE) TABS tablet Take 1 tablet by mouth daily 30 tablet 1    levocetirizine (XYZAL) 5 MG tablet Take 1 tablet by mouth nightly 90 tablet 1    montelukast (SINGULAIR) 10 MG tablet Take 1 tablet by mouth nightly 90 tablet 1    pantoprazole (PROTONIX) 40 MG tablet Take 1 tablet by mouth daily 90 tablet 1    rosuvastatin (CRESTOR) 20 MG tablet Take 1 tablet by mouth nightly 90 tablet 1    azelastine HCl 0.15 % SOLN 1 spray by Each Nostril route daily USE 2 SPRAY(S) TWICE DAILY INTRANASALLY AS DIRECTED 23 mL 3    mometasone (NASONEX) 50 MCG/ACT nasal spray 2 sprays by Nasal route daily 3 each 3    glycerin-hypromellose-PEG 400 (DRY EYE RELIEF DROPS) 0.2-0.2-1 % SOLN opthalmic solution Place 1  drop into both eyes as needed (as needed for dry eyes) 1 each 3    aspirin 81 MG EC tablet Take 1 tablet by mouth daily      EPINEPHrine (EPIPEN) 0.3 MG/0.3ML SOAJ injection USE 1 PEN INTRAMUSCULARLY AR NEEDED ONLY FOR SEVERE REACTION FOR 1 DAY           Objective:   Constitutional:   Reviewed vitals above  Well Nourished, well developed, no distress       HENT:  Normal external nose without lesions  Resp:  Normal effort  Clear to auscultation bilaterally without rhonchi, wheezing or crackles  Cardiovascular:  No JVD  On auscultation, normal S1 and S2 without murmurs, rubs or gallops  Musculoskeletal:  Normal Gait  Skin:  No rashes on inspection  Psych:  Normal mood and affect  Normal insight and judgement      Assessment / Plan:   Susan [name] is a 56 y.o. female with a history of HTN, GERD, Mixed hyperlipidemia, prediabetes, and chronic hyponatremia who presents today for an established care visit.       Hypertension  -BP not at goal, patient is to monitor her BP and pulse at home  -Continue DASH diet  -Patient continue  Olmesartan (BENICAR) 40 MG tablet, Daily  -Start Metoprolol Succinate (TOPROL XL) 25 MG, Daily  Hx of antihypertensive meds:  - HCTZ, was the likely cause of her SIADH  -Calcium Channel blockers, Caused lowered extremity edema  -Lisinopril, caused a cough    3.  Headaches  -not at goal  -Continue Naproxen 500 MG tablet 2 times daily  DDX:  -Hypertensive headache vs migraine vs cluster headache vs tension headache  -Most likely Hypertensive headache, due to her elevated blood pressure, and the timing of stopping her hypertensive medication. Least likely cluster headache due to the lack of ipsilateral rhinorrhea, eye tearing, and eye redness.  Head CT on 09/29/21:  -FINDINGS:  BRAIN/VENTRICLES: There is no acute intracranial hemorrhage, mass effect or  midline shift.  No abnormal extra-axial fluid collection.  The gray-white  differentiation is maintained without evidence of an acute infarct.  There is  no evidence of hydrocephalus.  ORBITS: The visualized portion of the orbits demonstrate no acute abnormality.  SINUSES: The visualized paranasal sinuses and mastoid air cells demonstrate  no acute abnormality.  SOFT TISSUES/SKULL:  No acute abnormality of the visualized skull or soft  tissues.  IMPRESSION:  -No acute intracranial abnormality.    2. SIADH  -Not controlled  -continue salt tablets  -BMP ordered for today      Health care decision maker:  <15 years old        EMR Dragon/transcription disclaimer:  Much of this encounter note is electronic transcription/translation of spoken language to printed texts.  The electronic translation of spoken language may be erroneous, or at times, nonsensical words or phrases may be inadvertently transcribed.  Although I have reviewed the note for such errors, some may still exist.       Janifer Adie, DO   PGY-1  [facility]

## 2021-10-15 ENCOUNTER — Encounter

## 2021-10-21 ENCOUNTER — Inpatient Hospital Stay: Payer: BLUE CROSS/BLUE SHIELD | Primary: Family Medicine

## 2021-10-21 DIAGNOSIS — E222 Syndrome of inappropriate secretion of antidiuretic hormone: Secondary | ICD-10-CM

## 2021-10-21 LAB — BASIC METABOLIC PANEL
Anion Gap: 12 (ref 3–16)
BUN: 8 mg/dL (ref 7–20)
CO2: 24 mmol/L (ref 21–32)
Calcium: 9.4 mg/dL (ref 8.3–10.6)
Chloride: 100 mmol/L (ref 99–110)
Creatinine: 0.7 mg/dL (ref 0.6–1.1)
Est, Glom Filt Rate: >60 (ref >60)
Glucose: 89 mg/dL (ref 70–99)
Potassium: 4.3 mmol/L (ref 3.5–5.1)
Sodium: 136 mmol/L (ref 136–145)

## 2021-10-21 LAB — OSMOLALITY, URINE: Osmolality, Ur: 566 mOsm/kg (ref 390–1070)

## 2021-10-21 LAB — OSMOLALITY: Osmolality: 287 mOsm/kg (ref 275–295)

## 2021-10-22 LAB — SODIUM, URINE, RANDOM: Sodium, Ur: 144 mmol/L

## 2021-10-22 LAB — CREATININE, RANDOM URINE: Creatinine, Ur: 126.4 mg/dL (ref 28.0–259.0)

## 2021-10-24 ENCOUNTER — Ambulatory Visit
Admit: 2021-10-24 | Discharge: 2021-10-24 | Payer: BLUE CROSS/BLUE SHIELD | Attending: Family Medicine | Primary: Family Medicine

## 2021-10-24 ENCOUNTER — Inpatient Hospital Stay: Payer: BLUE CROSS/BLUE SHIELD | Primary: Family Medicine

## 2021-10-24 DIAGNOSIS — I10 Essential (primary) hypertension: Secondary | ICD-10-CM

## 2021-10-24 LAB — BASIC METABOLIC PANEL
Anion Gap: 10 (ref 3–16)
BUN: 10 mg/dL (ref 7–20)
CO2: 27 mmol/L (ref 21–32)
Calcium: 9.7 mg/dL (ref 8.3–10.6)
Chloride: 101 mmol/L (ref 99–110)
Creatinine: 0.7 mg/dL (ref 0.6–1.1)
Est, Glom Filt Rate: >60 (ref >60)
Glucose: 92 mg/dL (ref 70–99)
Potassium: 4.6 mmol/L (ref 3.5–5.1)
Sodium: 138 mmol/L (ref 136–145)

## 2021-10-24 MED ORDER — MOMETASONE FUROATE 50 MCG/ACT NA SUSP
50 MCG/ACT | Freq: Every day | NASAL | 3 refills | Status: AC
Start: 2021-10-24 — End: 2022-10-24

## 2021-10-24 MED ORDER — ROSUVASTATIN CALCIUM 20 MG PO TABS
20 MG | ORAL_TABLET | Freq: Every evening | ORAL | 1 refills | Status: DC
Start: 2021-10-24 — End: 2022-03-18

## 2021-10-24 MED ORDER — MONTELUKAST SODIUM 10 MG PO TABS
10 MG | ORAL_TABLET | Freq: Every evening | ORAL | 1 refills | Status: DC
Start: 2021-10-24 — End: 2022-03-18

## 2021-10-24 MED ORDER — METOPROLOL SUCCINATE ER 50 MG PO TB24
50 MG | ORAL_TABLET | Freq: Every day | ORAL | 0 refills | Status: AC
Start: 2021-10-24 — End: 2021-11-07

## 2021-10-24 MED ORDER — EPINEPHRINE 0.3 MG/0.3ML IJ SOAJ
0.30.3 MG/ML | INTRAMUSCULAR | 0 refills | Status: DC
Start: 2021-10-24 — End: 2023-01-09

## 2021-10-24 MED ORDER — LEVOCETIRIZINE DIHYDROCHLORIDE 5 MG PO TABS
5 MG | ORAL_TABLET | Freq: Every evening | ORAL | 1 refills | Status: DC
Start: 2021-10-24 — End: 2022-03-18

## 2021-10-24 MED ORDER — PANTOPRAZOLE SODIUM 40 MG PO TBEC
40 MG | ORAL_TABLET | Freq: Every day | ORAL | 1 refills | Status: DC
Start: 2021-10-24 — End: 2022-03-18

## 2021-10-24 MED ORDER — AZELASTINE HCL 0.15 % NA SOLN
0.15 % | Freq: Every day | NASAL | 3 refills | Status: DC
Start: 2021-10-24 — End: 2022-03-18

## 2021-10-24 MED ORDER — THERMOTABS PO TABS
ORAL_TABLET | Freq: Every day | ORAL | 3 refills | Status: DC
Start: 2021-10-24 — End: 2022-03-18

## 2021-10-24 MED ORDER — DRY EYE RELIEF DROPS 0.2-0.2-1 % OP SOLN
OPHTHALMIC | 3 refills | Status: AC | PRN
Start: 2021-10-24 — End: 2023-01-09

## 2021-10-24 MED ORDER — OLMESARTAN MEDOXOMIL 40 MG PO TABS
40 MG | ORAL_TABLET | Freq: Every day | ORAL | 3 refills | Status: AC
Start: 2021-10-24 — End: 2022-03-18

## 2021-10-24 NOTE — Progress Notes (Signed)
PROGRESS NOTE   [facility] and [facility]                                  [address], [place] [address]         Phone: [phone]    Date of Service:  10/24/2021     Patient ID: .Susan [name] is a 56 y.o. female      Subjective:     CC: HTN SIADH    HPI  Susan [name] 56 y.o. female has Seasonal allergies; Mixed hyperlipidemia; Essential hypertension; Chronic hyponatremia; Family history of elevated lipoprotein (a); ETD (eustachian tube dysfunction); Gastroesophageal reflux disease; Dry eyes; Prediabetes; Weight gain; Tobacco use; Sinus congestion; and SIADH (syndrome of inappropriate ADH production) (HCC) on their problem list.      Patient was diagnosed with SIADH in July.  She has a appointment scheduled with nephrology later this month. She is restricting her fluid to 32 oz. She is having trouble with fluid restriction. She has started drinking coffee and had a half a margarita. She has been taking her salt tabs, 1 daily.     She has had some elevated blood pressures at home >170s/90s and as low as 120s/ 70s. She thinks she was on a calcium channel blocker in the past and had severe LE swelling. She was started on metoprolol 25 mg 7/24. Olmesartan increased from 20 mg to 40 mg 7/13. Last lab 7/24 and 7/31 had normalized.     She has noticed that she is more sweaty with activity. She is active and goes for a walk almost daily. She has occasional headache. No tremors. No confusion. She has had a big improvement in her headaches since starting her metoprolol 25 mg. She has nausea about 3 times a week.     ROS:  All review of systems negative other than noted in HPI      Vitals:    10/24/21 0857 10/24/21 0957   BP: 138/82 (!) 140/88   Site: Left Upper Arm Left Upper Arm   Position: Sitting    Cuff Size: Medium Adult    Pulse: 77    SpO2: 98%    Weight: 134 lb (60.8 kg)    Height: 5' 2.5" (1.588 m)        Allergies:  Food, Other,  Prednazoline, Cipro xr, and Sulfa antibiotics    Outpatient Medications Marked as Taking for the 10/24/21 encounter (Office Visit) with [name] Mitchell Iwanicki, DO   Medication Sig Dispense Refill    thermotabs (MEDI-LYTE) TABS tablet Take 1 tablet by mouth daily 90 tablet 3    olmesartan (BENICAR) 40 MG tablet Take 1 tablet by mouth daily 90 tablet 3    metoprolol succinate (TOPROL XL) 50 MG extended release tablet Take 1 tablet by mouth daily 30 tablet 0    levocetirizine (XYZAL) 5 MG tablet Take 1 tablet by mouth nightly 90 tablet 1    montelukast (SINGULAIR) 10 MG tablet Take 1 tablet by mouth nightly 90 tablet 1    azelastine HCl 0.15 % SOLN 1 spray by Each Nostril route daily USE 2 SPRAY(S) TWICE DAILY INTRANASALLY AS DIRECTED 23 mL 3    pantoprazole (PROTONIX) 40 MG tablet Take 1 tablet by mouth daily 90 tablet 1    rosuvastatin (CRESTOR) 20 MG tablet Take 1 tablet by mouth nightly 90 tablet  1    mometasone (NASONEX) 50 MCG/ACT nasal spray 2 sprays by Nasal route daily 3 each 3    EPINEPHrine (EPIPEN) 0.3 MG/0.3ML SOAJ injection USE 1 PEN INTRAMUSCULARLY AR NEEDED ONLY FOR SEVERE REACTION FOR 1 DAY 2 each 0    glycerin-hypromellose-PEG 400 (DRY EYE RELIEF DROPS) 0.2-0.2-1 % SOLN opthalmic solution Place 1 drop into both eyes as needed (as needed for dry eyes) 1 each 3    naproxen (NAPROSYN) 500 MG tablet Take 1 tablet by mouth 2 times daily (with meals) 14 tablet 0    aspirin 81 MG EC tablet Take 1 tablet by mouth daily           Objective:   Physical Exam  Constitutional:       Appearance: Normal appearance.      Comments: Appears euvolemic.   HENT:      Head: Normocephalic and atraumatic.      Nose: Nose normal. No congestion or rhinorrhea.   Eyes:      Extraocular Movements: Extraocular movements intact.      Conjunctiva/sclera: Conjunctivae normal.      Pupils: Pupils are equal, round, and reactive to light.   Cardiovascular:      Rate and Rhythm: Normal rate and regular rhythm.      Pulses: Normal pulses.       Heart sounds: Normal heart sounds.   Pulmonary:      Effort: Pulmonary effort is normal.      Breath sounds: Normal breath sounds.   Musculoskeletal:         General: Normal range of motion.      Right lower leg: No edema.      Left lower leg: No edema.   Skin:     General: Skin is warm and dry.   Neurological:      General: No focal deficit present.      Mental Status: She is alert and oriented to person, place, and time.   Psychiatric:         Mood and Affect: Mood normal.         Behavior: Behavior normal.           Assessment / Plan:     1. Essential hypertension  Not well controlled, increase metoprolol to 50 mg from 25 mg.  Continue olmesartan.  Recommend that patient monitor blood pressure and heart rate at home.  - Basic Metabolic Panel; Future  - metoprolol succinate (TOPROL XL) 50 MG extended release tablet; Take 1 tablet by     2. SIADH (syndrome of inappropriate ADH production) (HCC)  Recommend repeating BMP.  Patient states that she has been drinking closer to 40 ounces a day.  If BMP is reassuring she Mayerly Kaman continue to do this otherwise we will have to resume more strict fluid restriction.  Continue salt tabs.  - Basic Metabolic Panel; Future    3. Lung nodule  Recommend repeat low-dose lung CT 09/2022  - CT Lung Screen (Initial or Annual); Future    4. Personal history of tobacco use  - CT Lung Screen (Initial or Annual); Future  - PR VISIT TO DISCUSS LUNG CA SCREEN W LDCT    5. Hyponatremia  Resolved, will repeat BMP today to ensure sodium is stable  - thermotabs (MEDI-LYTE) TABS tablet; Take 1 tablet by mouth daily  Dispense: 90 tablet; Refill: 3    10. Seasonal allergies  Patient requesting refill for below medications  - levocetirizine (XYZAL) 5 MG tablet;  Take 1 tablet by mouth nightly  Dispense: 90 tablet; Refill: 1  - montelukast (SINGULAIR) 10 MG tablet; Take 1 tablet by mouth nightly  Dispense: 90 tablet; Refill: 1  - azelastine HCl 0.15 % SOLN; 1 spray by Each Nostril route daily USE 2 SPRAY(S)  TWICE DAILY INTRANASALLY AS DIRECTED  Dispense: 23 mL; Refill: 3  - mometasone (NASONEX) 50 MCG/ACT nasal spray; 2 sprays by Nasal route daily  Dispense: 3 each; Refill: 3    11. Dry eyes  Patient requesting refill for below medication  - glycerin-hypromellose-PEG 400 (DRY EYE RELIEF DROPS) 0.2-0.2-1 % SOLN opthalmic solution; Place 1 drop into both eyes as needed (as needed for dry eyes)  Dispense: 1 each; Refill: 3    12. Gastroesophageal reflux disease, unspecified whether esophagitis present  Patient requesting refill for below medication  - azelastine HCl 0.15 % SOLN; 1 spray by Each Nostril route daily USE 2 SPRAY(S) TWICE DAILY INTRANASALLY AS DIRECTED  Dispense: 23 mL; Refill: 3    13. Mixed hyperlipidemia  Well-controlled, continue current medication  - rosuvastatin (CRESTOR) 20 MG tablet; Take 1 tablet by mouth nightly  Dispense: 90 tablet; Refill: 1       Follow-up 4 weeks        EMR Dragon/transcription disclaimer:  Much of this encounter note is electronic transcription/translation of spoken language to printed texts.  The electronic translation of spoken language Clayden Withem be erroneous, or at times, nonsensical words or phrases Treyven Lafauci be inadvertently transcribed.  Although I have reviewed the note for such errors, some Jojo Geving still exist.   Discussed with the patient the current USPSTF guidelines released May 31, 2019 for screening for lung cancer.    For adults aged 33 to 16 years who have a 20 pack-year smoking history and currently smoke or have quit within the past 15 years the grade B recommendation is to:  Screen for lung cancer with low-dose computed tomography (LDCT) every year.  Stop screening once a person has not smoked for 15 years or has a health problem that limits life expectancy or the ability to have lung surgery.    The patient  reports that she has been smoking cigarettes. She started smoking about 37 years ago. She has a 25.00 pack-year smoking history. She has never used smokeless tobacco..  Discussed with patient the risks and benefits of screening, including over-diagnosis, false positive rate, and total radiation exposure.  The patient currently exhibits no signs or symptoms suggestive of lung cancer.  Discussed with patient the importance of compliance with yearly annual lung cancer screenings and willingness to undergo diagnosis and treatment if screening scan is positive.  In addition, the patient was counseled regarding the importance of remaining smoke free and/or total smoking cessation.    Also reviewed the following if the patient has Medicare that as of May 03, 2020, Medicare only covers LDCT screening in patients aged 50-77 with at least a 20 pack-year smoking history who currently smoke or have quit in the last 15 years

## 2021-10-24 NOTE — Patient Instructions (Signed)
Learning About Lung Cancer Screening  What is screening for lung cancer?     Lung cancer screening is a way to find some lung cancers early, before a person has any symptoms of the cancer.  Lung cancer screening Susan [name] help those who have the highest risk for lung cancer--people age 55 and older who are or were heavy smokers. For most people, who aren't at increased risk, screening for lung cancer probably isn't helpful.  Screening won't prevent cancer. And it Susan [name] not find all lung cancers. Lung cancer screening Susan [name] lower the risk of dying from lung cancer in a small number of people.  How is it done?  Lung cancer screening is done with a low-dose CT (computed tomography) scan. A CT scan uses X-rays, or radiation, to make detailed pictures of your body. Experts recommend that screening be done in medical centers that focus on finding and treating lung cancer.  Who is screening recommended for?  Lung cancer screening is recommended for people age 55 and older who are or were heavy smokers. That means people with a smoking history of at least 20 pack years. A pack year is a way to measure how heavy a smoker you are or were.  To figure out your pack years, multiply how many packs a day on average (assuming 20 cigarettes per pack) you have smoked by how many years you have smoked. For example:  If you smoked 1 pack a day for 20 years, that's 1 times 20. So you have a smoking history of 20 pack years.  If you smoked 2 packs a day for 10 years, that's 2 times 10. So you have a smoking history of 20 pack years.  Experts agree that screening is for people who have a high risk of lung cancer. But experts don't agree on what high risk means. Some say people age 55 or older with at least a 20-pack-year smoking history are high risk. Others say it's people age 55 or older with a 30-pack-year history.  To see if you could benefit from screening, first find out if you are at high risk for lung cancer. Your doctor can help you  decide your lung cancer risk.  What are the risks of screening?  CT screening for lung cancer isn't perfect. It can show an abnormal result when it turns out there wasn't any cancer. This is called a false-positive result. This means you Susan [name] need more tests to make sure you don't have cancer. These tests can be harmful and cause a lot of worry.  These tests Susan [name] include more CT scans and invasive testing like a lung biopsy. In a biopsy, the doctor takes a sample of tissue from inside your lung so it can be looked at under a microscope. A biopsy is the only way to tell if you have lung cancer. If the biopsy finds cancer, you and your doctor will have to decide how or whether to treat it.  Some lung cancers found on CT scans are harmless and would not have caused a problem if they had not been found through screening. But because doctors can't tell which ones will turn out to be harmless, most will be treated. This means that you Susan [name] get treatment--including surgery, radiation, or chemotherapy--that you don't need.  There is a risk of damage to cells or tissue from being exposed to radiation, including the small amounts used in CTs, X-rays, and other medical tests. Over time, exposure to radiation Susan [name] cause cancer   and other health problems. But in most cases, the risk of getting cancer from being exposed to small amounts of radiation is low. It's not a reason to avoid these tests for most people.  What are the benefits of screening?  Your scan Dalisha Shively be normal (negative).  For some people who are at higher risk, screening lowers the chance of dying of lung cancer. How much and how long you smoked helps to determine your risk level. Screening can find some cancers early, when treatment Susan [name] be more likely to work.  What happens after screening?  The results of your CT scan will be sent to your doctor. Someone from your care team will explain the results of your scan and answer any questions you Susan [name] have. If you need any  follow-up, he or she will help you understand what to do next.  After a lung cancer screening, you can go back to your usual activities right away.  A lung cancer screening test can't tell if you have lung cancer. If your results are positive, your doctor can't tell whether an abnormal finding is a harmless nodule, cancer, or something else without doing more tests.  What can you do to help prevent lung cancer?  Some lung cancers can't be prevented. But if you smoke, quitting smoking is the best step you can take to prevent lung cancer. If you want to quit, your doctor can recommend medicines or other ways to help.  Follow-up care is a key part of your treatment and safety. Be sure to make and go to all appointments, and call your doctor if you are having problems. It's also a good idea to know your test results and keep a list of the medicines you take.  Where can you learn more?  Go to https://www.healthwise.net/patientEd and enter Q940 to learn more about "Learning About Lung Cancer Screening."  Current as of: March 1, 2023Content Version: 13.7   2006-2023 [employer].   Care instructions adapted under license by [facility]. If you have questions about a medical condition or this instruction, always ask your healthcare professional. [employer] Inc[employer] disclaims any warranty or liability for your use of this information.

## 2021-11-06 ENCOUNTER — Telehealth

## 2021-11-06 ENCOUNTER — Inpatient Hospital Stay: Payer: BLUE CROSS/BLUE SHIELD | Primary: Family Medicine

## 2021-11-06 DIAGNOSIS — I10 Essential (primary) hypertension: Secondary | ICD-10-CM

## 2021-11-06 NOTE — Telephone Encounter (Signed)
Patient going now to lab now to get this lab done and is coming in tomorrow to see Dr.Imam

## 2021-11-06 NOTE — Telephone Encounter (Signed)
Patient called nurse triage states she is saying her head is swimming heart rate is dropping a lot patient states it will be  at 88 walking up and down steps and when she goes to sit down it goes down between 55-60 she is feeling tired and thinks she feels a little dehydrated. She is on a fluid restriction she knows of that she stated that she thinks the side effects are from the  metoprolol succinate the medication was increased to 50 and these issues started after she started taking the new increased dose.  She said maybe do lab and urine labs to see what her stuff comes back with?

## 2021-11-07 ENCOUNTER — Ambulatory Visit: Admit: 2021-11-07 | Discharge: 2021-11-07 | Payer: BLUE CROSS/BLUE SHIELD | Primary: Family Medicine

## 2021-11-07 DIAGNOSIS — I10 Essential (primary) hypertension: Secondary | ICD-10-CM

## 2021-11-07 LAB — BASIC METABOLIC PANEL
Anion Gap: 13 (ref 3–16)
BUN: 10 mg/dL (ref 7–20)
CO2: 25 mmol/L (ref 21–32)
Calcium: 9.3 mg/dL (ref 8.3–10.6)
Chloride: 98 mmol/L — ABNORMAL LOW (ref 99–110)
Creatinine: 0.8 mg/dL (ref 0.6–1.1)
Est, Glom Filt Rate: >60 (ref >60)
Glucose: 97 mg/dL (ref 70–99)
Potassium: 4.3 mmol/L (ref 3.5–5.1)
Sodium: 136 mmol/L (ref 136–145)

## 2021-11-07 MED ORDER — METOPROLOL SUCCINATE ER 25 MG PO TB24
25 MG | ORAL_TABLET | ORAL | 2 refills | Status: AC
Start: 2021-11-07 — End: ?

## 2021-11-07 NOTE — Progress Notes (Signed)
PROGRESS NOTE   [facility] and [facility] Medicine Residency [facility]                                  [address], [place] [address]         Phone: [phone]    Date of Service:  11/07/2021     Patient ID: .Susan [name] is a 56 y.o. female      Subjective:     CC:   Medcheck    HPI    Patient is a 56 year old female with past medical history of seasonal allergies, mixed hyperlipidemia, essential hypertension, chronic hyponatremia, GERD, tobacco use, and SIADH    Recent diagnosis of SIADH in July.  Patient has appointment with nephrology on December 10, 2021.  Patient called clinic on 11/06/2021 stating her head is swimming and heart rate is dropping a lot.  She reports that when she is walking her heart rate is at 88 and when she sits down it goes down to 55-60 and she is feeling tired and thinks she feels a little dehydrated.  She has been on fluid restriction of 40 ounces.  She believes that her symptoms were due to increase of metoprolol succinate to 50 mg as such her metoprolol succinate was decreased back to 25 mg yesterday and BMP order was placed.      -Was feeling dizzy since med change  -Exhausted: would wake up and would want to go back to sleep  -Head felt swimmy, like she was out of it, felt like she was glazed over  -Headaches and blurry vision on lower dose of metoprolol  -HCTZ for about 2.5 years and Hyponatremia for more than 5 years      ROS:    Review of Systems   Constitutional:  Positive for fatigue.   Eyes:  Negative for visual disturbance.   Respiratory:  Negative for chest tightness and shortness of breath.    Cardiovascular:  Negative for chest pain and leg swelling.   Gastrointestinal:  Negative for diarrhea, nausea and vomiting.         Vitals:    11/07/21 1525   BP: 128/64   Site: Left Upper Arm   Position: Sitting   Cuff Size: Small Adult   Pulse: 71   Temp: 97.9 F (36.6 C)   TempSrc: Temporal   SpO2: 96%   Weight: 134 lb (60.8 kg)        Allergies:  Food, Other, Prednazoline, Cipro xr, and Sulfa antibiotics    Outpatient Medications Marked as Taking for the 11/07/21 encounter (Office Visit) with Ronnette Hila, MD   Medication Sig Dispense Refill    metoprolol succinate (TOPROL XL) 25 MG extended release tablet Take 1-1/2 (1.5) tablets a day. 45 tablet 2    thermotabs (MEDI-LYTE) TABS tablet Take 1 tablet by mouth daily 90 tablet 3    olmesartan (BENICAR) 40 MG tablet Take 1 tablet by mouth daily 90 tablet 3    levocetirizine (XYZAL) 5 MG tablet Take 1 tablet by mouth nightly 90 tablet 1    montelukast (SINGULAIR) 10 MG tablet Take 1 tablet by mouth nightly 90 tablet 1    azelastine HCl 0.15 % SOLN 1 spray by Each Nostril route daily USE 2 SPRAY(S) TWICE DAILY INTRANASALLY AS DIRECTED 23 mL 3    pantoprazole (PROTONIX) 40 MG tablet Take 1 tablet by  mouth daily 90 tablet 1    rosuvastatin (CRESTOR) 20 MG tablet Take 1 tablet by mouth nightly 90 tablet 1    mometasone (NASONEX) 50 MCG/ACT nasal spray 2 sprays by Nasal route daily 3 each 3    EPINEPHrine (EPIPEN) 0.3 MG/0.3ML SOAJ injection USE 1 PEN INTRAMUSCULARLY AR NEEDED ONLY FOR SEVERE REACTION FOR 1 DAY 2 each 0    glycerin-hypromellose-PEG 400 (DRY EYE RELIEF DROPS) 0.2-0.2-1 % SOLN opthalmic solution Place 1 drop into both eyes as needed (as needed for dry eyes) 1 each 3    naproxen (NAPROSYN) 500 MG tablet Take 1 tablet by mouth 2 times daily (with meals) 14 tablet 0    aspirin 81 MG EC tablet Take 1 tablet by mouth daily           Objective:   Physical Exam  Constitutional:       Appearance: Normal appearance. She is normal weight.   HENT:      Head: Normocephalic and atraumatic.      Mouth/Throat:      Mouth: Mucous membranes are moist.      Pharynx: Oropharynx is clear.   Eyes:      Pupils: Pupils are equal, round, and reactive to light.   Cardiovascular:      Rate and Rhythm: Normal rate and regular rhythm.      Pulses: Normal pulses.      Heart sounds: Normal heart sounds.    Pulmonary:      Effort: Pulmonary effort is normal.      Breath sounds: Normal breath sounds.   Musculoskeletal:         General: No swelling or tenderness.      Cervical back: Normal range of motion and neck supple. No tenderness.   Lymphadenopathy:      Cervical: No cervical adenopathy.   Neurological:      Mental Status: She is alert.           Assessment / Plan:   Patient is a 56 year old female with past medical history of seasonal allergies, mixed hyperlipidemia, essential hypertension, chronic hyponatremia, GERD, tobacco use, and SIADH presents to the clinic for medication management      1. Essential hypertension  -Not controlled  -With increase of metoprolol to 50 mg (up from 25) bp controlled but patient reported fatigue.  -Continue olmesartan 40 mg   -Persistent cough with previous use of ACE inhibitors   -Persistent lower extremity edema with previous use of calcium channel blockers   -Persistent hyponatremia with previous use of hydrochlorothiazide  -Previous use of hydrochlorothiazide 12.5 mg capsule (d/c 09/19/21) due to concerns for hyponatremia  -Continue to monitor blood pressure and heart rate at home  -Decreased metoprolol succinate to 37.5 mg/day i.e. 1-1/2 tablets/day of 25 mg tablets.  -If patient's symptoms are not managed well with metoprolol succinate 37.5 mg daily consider switching to nebivolol 2.5 mg daily  - metoprolol succinate (TOPROL XL) 25 MG extended release tablet; Take 1-1/2 (1.5) tablets a day.  Dispense: 45 tablet; Refill: 2    2. SIADH (syndrome of inappropriate ADH production) (HCC)  -Patient continues to restrict fluid intake to 40 ounces a day  -Continue salt tabs  --Sodium: (09/19/21) 129 --> 133 --> 136--> (10/24/2021) 138 --> (11/06/2021) 136  -Appointment with nephrology on December 10, 2021    3. Hyponatremia  -Controlled  -Sodium: (09/19/21) 129 --> 133 --> 136--> (10/24/2021) 138 --> (11/06/2021) 136  - Continue thermotabs (MEDI-LYTE) TABS tablet; Take 1  tablet by mouth  daily         Health Maitenance: none at this visit    Health care decision maker:  <29 years old        EMR Dragon/transcription disclaimer:  Much of this encounter note is electronic transcription/translation of spoken language to printed texts.  The electronic translation of spoken language may be erroneous, or at times, nonsensical words or phrases may be inadvertently transcribed.  Although I have reviewed the note for such errors, some may still exist.     [facility] Medicine Residency  Ulyses Amor, M.D., PGY-1

## 2021-11-27 ENCOUNTER — Ambulatory Visit
Admit: 2021-11-27 | Discharge: 2021-11-27 | Payer: BLUE CROSS/BLUE SHIELD | Attending: Family Medicine | Primary: Family Medicine

## 2021-11-27 DIAGNOSIS — I10 Essential (primary) hypertension: Secondary | ICD-10-CM

## 2021-11-27 NOTE — Progress Notes (Signed)
PROGRESS NOTE   [facility] Medicine Residency Practice                                  [address], [place] [address]         Phone: [phone]    Date of Service:  11/27/2021     Patient ID: .Susan [name] is a 56 y.o. female      Subjective:     CC: SIADH, HTN    HPI  Susan [name] 57 y.o. female has Seasonal allergies; Mixed hyperlipidemia; Essential hypertension; Chronic hyponatremia; Family history of elevated lipoprotein (a); ETD (eustachian tube dysfunction); Gastroesophageal reflux disease; Dry eyes; Prediabetes; Weight gain; Tobacco use; Sinus congestion; SIADH (syndrome of inappropriate ADH production) (HCC); and Influenza B on their problem list.      She has headaches and thinks that this is worse when her BP is high but Gracee Ratterree also be when her BP is low. She also had a recent shoulder injury which has been causing neck tension and she wonders if this is playing a role as well. She notices that her urine is cloudy. No dysuria. Her lips feel a little dry in the morning. She has noticed occasional blurry vision and light headedness. Occasionally this occurs with her headaches. She also has severe allergies and sinus infections and occassionally she has similar symptoms with allergies. Patient denies any exertional chest pain, dyspnea, palpitations, syncope, orthopnea, edema or paroxysmal nocturnal dyspnea.     She brought in a BP log SBP 102-150, DBP 60-120. HR 60-80. Log difficult to interpret as it is on sticky notes.    CCB- Leg swelling  ACE- Cough  She has a nephrology appointment 12/10/2021  Continues 40 oz fluid restriction    Metoprolol dose decreased from 50 mg to 37.7 mg (11/07/2021)      ROS:    Negative other than noted in the HPI        Vitals:    11/27/21 0902 11/27/21 0938   BP: (!) 150/76 120/72   Site: Left Upper Arm Left Upper Arm   Position: Sitting Sitting   Cuff Size: Small Adult Small Adult   Pulse: 74    Temp: 97.6 F (36.4  C)    TempSrc: Temporal    SpO2: 99%    Weight: 135 lb 12.8 oz (61.6 kg)        Allergies:  Food, Other, Prednazoline, Cipro xr, and Sulfa antibiotics    Outpatient Medications Marked as Taking for the 11/27/21 encounter (Office Visit) with [name] Emme Rosenau, DO   Medication Sig Dispense Refill    metoprolol succinate (TOPROL XL) 25 MG extended release tablet Take 1-1/2 (1.5) tablets a day. 45 tablet 2    thermotabs (MEDI-LYTE) TABS tablet Take 1 tablet by mouth daily 90 tablet 3    olmesartan (BENICAR) 40 MG tablet Take 1 tablet by mouth daily 90 tablet 3    levocetirizine (XYZAL) 5 MG tablet Take 1 tablet by mouth nightly 90 tablet 1    montelukast (SINGULAIR) 10 MG tablet Take 1 tablet by mouth nightly 90 tablet 1    azelastine HCl 0.15 % SOLN 1 spray by Each Nostril route daily USE 2 SPRAY(S) TWICE DAILY INTRANASALLY AS DIRECTED 23 mL 3    pantoprazole (PROTONIX) 40 MG tablet Take 1 tablet by mouth daily 90 tablet 1  rosuvastatin (CRESTOR) 20 MG tablet Take 1 tablet by mouth nightly 90 tablet 1    mometasone (NASONEX) 50 MCG/ACT nasal spray 2 sprays by Nasal route daily 3 each 3    glycerin-hypromellose-PEG 400 (DRY EYE RELIEF DROPS) 0.2-0.2-1 % SOLN opthalmic solution Place 1 drop into both eyes as needed (as needed for dry eyes) 1 each 3    naproxen (NAPROSYN) 500 MG tablet Take 1 tablet by mouth 2 times daily (with meals) 14 tablet 0    aspirin 81 MG EC tablet Take 1 tablet by mouth daily           Objective:   Physical Exam  Constitutional:       Appearance: Normal appearance.   HENT:      Head: Normocephalic and atraumatic.      Nose: Nose normal. No congestion or rhinorrhea.   Eyes:      Extraocular Movements: Extraocular movements intact.      Conjunctiva/sclera: Conjunctivae normal.      Pupils: Pupils are equal, round, and reactive to light.   Cardiovascular:      Rate and Rhythm: Normal rate and regular rhythm.      Pulses: Normal pulses.      Heart sounds: Normal heart sounds.   Pulmonary:      Effort:  Pulmonary effort is normal.      Breath sounds: Normal breath sounds.   Abdominal:      General: Abdomen is flat. Bowel sounds are normal.      Palpations: Abdomen is soft.      Tenderness: There is no abdominal tenderness. There is no guarding or rebound.   Musculoskeletal:         General: Normal range of motion.   Skin:     General: Skin is warm and dry.   Neurological:      General: No focal deficit present.      Mental Status: She is alert and oriented to person, place, and time.   Psychiatric:         Mood and Affect: Mood normal.         Behavior: Behavior normal.           Assessment / Plan:   Susan [name] 56 y.o. female has Seasonal allergies; Mixed hyperlipidemia; Essential hypertension; Chronic hyponatremia; Family history of elevated lipoprotein (a); ETD (eustachian tube dysfunction); Gastroesophageal reflux disease; Dry eyes; Prediabetes; Weight gain; Tobacco use; Sinus congestion; SIADH (syndrome of inappropriate ADH production) (HCC); and Influenza B on their problem list.   Essential hypertension  Unclear control, continue current medications  -Recommend keeping organized BP log  - Due to BP recheck being at goal will not adjust medications today. If indicated by BP log consider adjusting medications at follow up. Patient has appointment with nephrology 9/19, recommend that patient bring BP log with her to this appointment.   - Unclear whether headache and other symptoms is related to BP. Recommend noting daily symptoms on BP log.     SIADH (syndrome of inappropriate ADH production) (HCC)  Stable, recommend follow up with Nephrology  - Continue 40 oz fluid restrictions and thermotabs        EMR Dragon/transcription disclaimer:  Much of this encounter note is electronic transcription/translation of spoken language to printed texts.  The electronic translation of spoken language Anginette Espejo be erroneous, or at times, nonsensical words or phrases Graylee Arutyunyan be inadvertently transcribed.  Although I have reviewed the  note for such errors, some Arren Laminack still exist.

## 2021-12-10 NOTE — Progress Notes (Signed)
Formatting of this note is different from the original.  Images from the original note were not included.                                              Initial Consult note/ H&P    Interval History/Plan:      Checking urine sodium along with urine osmolality and serum sodium to determine the etiology of hyponatremia but appears to be chronic SIADH   No clear precipitating factor currently and not on medication known to cause that   We will continue supportive treatment but in the future may need to go up on the salt tablet if sodium goes down but sodium has been stable last several times since July 2023  Her blood pressure fluctuates widely asked her to check only 5 minutes after relaxing  Also asked to avoid smoking and not to check blood pressure will have 1 hour of smoking   Also smoking can cause narrowing of the renal arteries and can impact blood pressure therefore strongly advised to stop smoking    Orders Placed This Encounter   Procedures   ? Basic Metabolic Panel (BMP=ep1)   ? Sodium, urine   ? Osmolality, urine     Assessment:     # Hyponatremia  Chronic since 2015   Has been to hospital multiple times needing IV fluids   First detected in 2018   Her sodium dropped to 123 after being on hydrochlorothiazide  Hydrochlorothiazide has been stopped at the time of initial presentation   She is a smoker has had workup with a CT scan of the chest and that was normal  Now on daily salt tablet, Pedialyte, also watching fluid fluid intake  No history of alcoholism    # Hypertension  BP Readings from Last 1 Encounters:   12/10/21 140/66   Blood pressure tends to be variable   B 200 systolic 20 50   On metoprolol  Also on olmesartan   Was on calcium channel blocker in the past causing edema     Susan [name], M.D      Susan [name]   March 19, 1966         Referring/Primary care Physician: Susan [name]    HPI:  Susan [name] is a 56 y.o. female is seen in the office for   for  hyponatremia  Noted at First visit at  office on 12/10/2021:   She is known to have hyponatremia for more than 5 years   Also has hypertension for a while   On 2 medications also was on hydrochlorothiazide which has been stopped recently due to hyponatremia with sodium as low as 123   Does does not take alcohol on a regular basis but smokes half pack a day    Accompanied by  no family  Lives at [place] area with husband has grown-up children   Previous or current Occupation:  Working    Interval history:    PMH/SH/FH:     No past medical history on file.  Hypertension   has no past surgical history on file.      Exam:      Current Outpatient Medications   Medication Sig Dispense Refill   ? Levocetirizine (Xyzal) 5 mg Tablet Take 1 Tablet by mouth nightly.     ? metoprolol succinate (TOPROL) 25 mg  XL tablet TAKE 1 & 1/2 (ONE & ONE-HALF) TABLETS BY MOUTH ONCE DAILY     ? montelukast (SINGULAIR) 10 mg Tablet Take 1 Tablet by mouth nightly.     ? Olmesartan 40 mg Tablet Take 40 mg by mouth daily.     ? pantoprazole (PROTONIX) 40 mg Tablet, Delayed Release (E.C.) Take 40 mg by mouth daily.     ? rosuvastatin (CRESTOR) 20 mg Tablet Take 20 mg by mouth nightly.     ? Thermotabs 287-180-15 mg Tablet Take 1 Tablet by mouth daily.       No current facility-administered medications for this visit.     Allergies   Allergen Reactions   ? Ciprofloxacin Anaphylaxis and Rash   ? Prednazoline      Other reaction(s): Headaches  Prednisones Makes bp rise    ? Sulfa (Sulfonamide Antibiotics) Other (See Comments)     Pt states she collapsed and was admitted to hospital       Vital Signs:      BP 140/66   Pulse 74   Temp 97.7 F (36.5 C) (Temporal)   Ht 5\' 2"  (1.575 m)   Wt 135 lb 3.2 oz (61.3 kg)   SpO2 98%   BMI 24.73 kg/m     Exam:      General appearance: alert, orientated, pleasant  Respiratory: no gross abnormality  Cardiovascular: no raised JVD, Edema 0    Abdomen: -  Soft, NT, ND  Other relevant findings: -          Electronically signed by Voncille Lo, MD at 12/10/2021  5:02 PM EDT

## 2021-12-30 ENCOUNTER — Ambulatory Visit: Admit: 2021-12-30 | Discharge: 2021-12-30 | Payer: BLUE CROSS/BLUE SHIELD | Primary: Family Medicine

## 2021-12-30 ENCOUNTER — Inpatient Hospital Stay: Payer: BLUE CROSS/BLUE SHIELD | Primary: Family Medicine

## 2021-12-30 DIAGNOSIS — R051 Acute cough: Secondary | ICD-10-CM

## 2021-12-30 LAB — BASIC METABOLIC PANEL
Anion Gap: 12 (ref 3–16)
BUN: 8 mg/dL (ref 7–20)
CO2: 27 mmol/L (ref 21–32)
Calcium: 9.5 mg/dL (ref 8.3–10.6)
Chloride: 95 mmol/L — ABNORMAL LOW (ref 99–110)
Creatinine: 0.8 mg/dL (ref 0.6–1.1)
Est, Glom Filt Rate: >60 (ref >60)
Glucose: 95 mg/dL (ref 70–99)
Potassium: 4.5 mmol/L (ref 3.5–5.1)
Sodium: 134 mmol/L — ABNORMAL LOW (ref 136–145)

## 2021-12-30 LAB — CBC WITH AUTO DIFFERENTIAL
Basophils %: 0.8 %
Basophils Absolute: 0.1 10*3/uL (ref 0.0–0.2)
Eosinophils %: 0.8 %
Eosinophils Absolute: 0.1 10*3/uL (ref 0.0–0.6)
Hematocrit: 39.5 % (ref 36.0–48.0)
Hemoglobin: 13.6 g/dL (ref 12.0–16.0)
Lymphocytes %: 19.2 %
Lymphocytes Absolute: 2.2 10*3/uL (ref 1.0–5.1)
MCH: 32.1 pg (ref 26.0–34.0)
MCHC: 34.4 g/dL (ref 31.0–36.0)
MCV: 93.4 fL (ref 80.0–100.0)
MPV: 7.9 fL (ref 5.0–10.5)
Monocytes %: 9.9 %
Monocytes Absolute: 1.1 10*3/uL (ref 0.0–1.3)
Neutrophils %: 69.3 %
Neutrophils Absolute: 7.9 10*3/uL — ABNORMAL HIGH (ref 1.7–7.7)
Platelets: 430 10*3/uL (ref 135–450)
RBC: 4.23 M/uL (ref 4.00–5.20)
RDW: 13.8 % (ref 12.4–15.4)
WBC: 11.4 10*3/uL — ABNORMAL HIGH (ref 4.0–11.0)

## 2021-12-30 MED ORDER — GUAIFENESIN-DM 100-10 MG/5ML PO SYRP
100-10 MG/5ML | Freq: Three times a day (TID) | ORAL | 0 refills | Status: AC | PRN
Start: 2021-12-30 — End: 2022-01-09

## 2021-12-30 MED ORDER — METOPROLOL SUCCINATE ER 25 MG PO TB24
25 MG | ORAL_TABLET | ORAL | 2 refills | Status: AC
Start: 2021-12-30 — End: 2022-03-18

## 2021-12-30 NOTE — Progress Notes (Cosign Needed)
PROGRESS NOTE   [facility] and [facility] Medicine Residency Practice                                  [address], [address], [place] [address]         Phone: [phone]    Date of Service:  12/30/2021     Patient ID: .Susan [name] is a 56 y.o. female      Subjective:     CC: Cough    HPI  Patient is 56 yo female with mixed HLD, essential HTN, ETD, GERD,and SIADH who presents for evaluation of cough. Patient states that cough started 5 days ago. She has also noticed sore throat, low grade fevers, and headaches as well. She recently returned from 18 day trip from [place] with her husband who had similar symptoms that started about a week and a half ago. She reports having mucus output with cough. Denies blood    ROS:    Constitutional:   Positive for low grade fevers and fatigue  HENT:  Positive for rhinorrhea, headaches  Eyes:  Negative for eye pain or visual changes  Resp:  Positive for SOB and cough  Cardiovascular: Negative for CP, palpitations, DOE, orthopnea, PND, LE edema  Gastrointestinal: Negative for abd pain, melena, BRBPR, N/V/D  Endocrine:  Negative for polydipsia and polyuria  GU:  Negative for dysuria, flank pain or urinary frequency  Musculoskeletal:  Negative for back pain or myalgias  Neuro:  Negative for dizziness or lightheadedness  Psych: negative for depression or anxiety        Vitals:    12/30/21 1326   BP: 124/70   Site: Left Upper Arm   Position: Sitting   Cuff Size: Small Adult   Pulse: 82   Temp: 99.1 F (37.3 C)   TempSrc: Oral   SpO2: 94%   Weight: 133 lb (60.3 kg)       Allergies:  Food, Other, Prednazoline, Cipro xr, and Sulfa antibiotics    Outpatient Medications Marked as Taking for the 12/30/21 encounter (Office Visit) with Cornell Barman, MD   Medication Sig Dispense Refill    guaiFENesin-dextromethorphan (ROBITUSSIN DM) 100-10 MG/5ML syrup Take 5 mLs by mouth 3 times daily as needed for Cough 120 mL 0    metoprolol succinate  (TOPROL XL) 25 MG extended release tablet Take 1-1/2 (1.5) tablets a day. 45 tablet 2         Objective:   Constitutional:   Reviewed vitals above  Well Nourished, well developed, no distress       HENT:  Normal external nose without lesions  Bilateral TMs translucent with normal light reflex and bony landmarks  Mild pharangeal erythema with no exudates  Normal nasal mucosa without swelling or erythema  Neck:  Symmetric and without masses  No thyromegaly  Resp:  Normal effort  Clear to auscultation bilaterally without rhonchi, wheezing or crackles  Cardiovascular:  On auscultation, normal S1 and S2 without murmurs, rubs or gallops  No bruits of bilateral carotids and no JVD  Gastrointestinal:  Nontender, nondistended, and no masses  No hepatosplenomegaly  Musculoskeletal:  Normal Gait  All extremities without clubbing, cyanosis or edema  Skin:  No rashes on inspection  No areas of increased heat or induration on palpation  Psych:  Normal mood and affect  Normal insight and judgement  Assessment / Plan:   Patient is 56 yo female with mixed HLD, essential HTN, ETD, GERD,and SIADH who presents for evaluation of cough.    1. Acute cough  - Five day cough with sore throat and low grade fevers. Likely viral URI given onset and symptoms. No exudates on exam. Vitals WNL and lung sounds unremarkable on exam.  - COVID-19 test ordered  - CBC with Auto Differential; Future  - Basic Metabolic Panel ordered given patient's history with SIADH  - Prescribed Robitussin DM to help with drainage.  - Patient was instructed that she should present to ED if she starts having confusion or becomes severely short of breath.

## 2021-12-31 LAB — COVID-19: SARS-CoV-2: NOT DETECTED

## 2022-01-02 ENCOUNTER — Encounter: Payer: PRIVATE HEALTH INSURANCE | Attending: Family Medicine | Primary: Family Medicine

## 2022-01-20 ENCOUNTER — Inpatient Hospital Stay: Payer: BLUE CROSS/BLUE SHIELD | Primary: Family Medicine

## 2022-01-20 ENCOUNTER — Ambulatory Visit: Admit: 2022-01-20 | Discharge: 2022-01-20 | Payer: BLUE CROSS/BLUE SHIELD | Primary: Family Medicine

## 2022-01-20 DIAGNOSIS — E871 Hypo-osmolality and hyponatremia: Secondary | ICD-10-CM

## 2022-01-20 NOTE — Progress Notes (Signed)
PROGRESS NOTE   [facility] and [facility] Medicine Residency Practice                                  [address], [address], [place] [address]         Phone: [phone]    Date of Service:  01/20/2022     Patient ID: .Susan [name] is a 56 y.o. female      Subjective:     CC: Dizziness and headache    HPI  Susan [name] is a 56 y.o. female with a Hx of seasonal allergies, mixed hyperlipidemia, essential hypertension, chronic hyponatremia, GERD, tobacco use, and SIADH who presents today with dizziness and a headache.    Dizziness  -Started 4 days ago  -Feels like her head is spinning  -Her dizziness is not associated with head movements  -Her last episode was this morning  -She reports that her head feels "mushy"  -She denies SOB, chest pain or palpitations  -Patient is monitoring her blood pressure at home; was high recently but is normal today  -Patient's systolic blood pressure was 140 on Saturday  -Patient takes 1.5 tablets of Toprol-XL 25 mg, daily    Headache  -Patient states that she has a history of occular headaches; states she was diagnosed a while back  -Started 5 days ago  -The headache is off and on; comes and goes throughout the day  -The pain is mostly in the posterior aspect of her crown; there are times when it is bitemporal  -Tried Tylenol and Advil, Advil helped more than the tylenol  -She reports her eyes have become blurry over the last few months; noticed because she reads every night on her kindle  -She denies vomiting, but had some nausea, especially this morning that has since resolved  -Patient currently has a headache, rates it a 4/10    Additional comments:  -Patient states that these conditions usually come on when her sodium is low or her blood pressure is high  -Patient wasn't sticking to her fluid restriction (40 oz) while overseas ([place], [place], and [place]).   -She came back to the [place] on 10/09  -Patient states she would like her sodium  checked  -Last urine sodium on 7/10: 46  -Last BMP on 10/9: 134 Na  -Patient is still taking her salt tablets/thermotabs.  -Patient requests influenza shot      ROS:    All pertinent ROS findings can be found above in HPI      Vitals:    01/20/22 1530   BP: 120/68   Site: Left Upper Arm   Position: Sitting   Cuff Size: Large Adult   Pulse: 97   Temp: 97.3 F (36.3 C)   TempSrc: Temporal   SpO2: 98%   Weight: 61.6 kg (135 lb 12.8 oz)       Allergies:  Food, Other, Prednazoline, Cipro xr, and Sulfa antibiotics    Outpatient Medications Marked as Taking for the 01/20/22 encounter (Office Visit) with Reed Pandy, DO   Medication Sig Dispense Refill    metoprolol succinate (TOPROL XL) 25 MG extended release tablet Take 1-1/2 (1.5) tablets a day. 45 tablet 2    thermotabs (MEDI-LYTE) TABS tablet Take 1 tablet by mouth daily 90 tablet 3    olmesartan (BENICAR) 40 MG tablet Take 1 tablet by mouth daily 90 tablet  3    levocetirizine (XYZAL) 5 MG tablet Take 1 tablet by mouth nightly 90 tablet 1    montelukast (SINGULAIR) 10 MG tablet Take 1 tablet by mouth nightly 90 tablet 1    azelastine HCl 0.15 % SOLN 1 spray by Each Nostril route daily USE 2 SPRAY(S) TWICE DAILY INTRANASALLY AS DIRECTED 23 mL 3    pantoprazole (PROTONIX) 40 MG tablet Take 1 tablet by mouth daily 90 tablet 1    mometasone (NASONEX) 50 MCG/ACT nasal spray 2 sprays by Nasal route daily 3 each 3    EPINEPHrine (EPIPEN) 0.3 MG/0.3ML SOAJ injection USE 1 PEN INTRAMUSCULARLY AR NEEDED ONLY FOR SEVERE REACTION FOR 1 DAY 2 each 0    glycerin-hypromellose-PEG 400 (DRY EYE RELIEF DROPS) 0.2-0.2-1 % SOLN opthalmic solution Place 1 drop into both eyes as needed (as needed for dry eyes) 1 each 3    naproxen (NAPROSYN) 500 MG tablet Take 1 tablet by mouth 2 times daily (with meals) 14 tablet 0    aspirin 81 MG EC tablet Take 1 tablet by mouth daily           Objective:   Constitutional:   Reviewed vitals above  Well Nourished, well developed, no distress        HENT:  Normal external nose without lesions  Bilateral TMs translucent with normal light reflex and bony landmarks  Normal nasal mucosa without swelling or erythema  Resp:  Normal effort  Clear to auscultation bilaterally without rhonchi, wheezing or crackles  Cardiovascular:  On auscultation, normal S1 and S2 without murmurs, rubs or gallops  Musculoskeletal:  Normal Gait  All extremities without clubbing, cyanosis or edema  Skin:  No rashes on inspection  No areas of increased heat or induration on palpation  Neuro:  CN II-XII grossly intact, no focal deficit  Biceps and patellar reflexes 2+ and symmetric   Finger-to-nose test negative  Psych:  Normal mood and affect  Normal insight and judgement       Assessment / Plan:   Susan [name] is a 56 y.o. female with a Hx of seasonal allergies, mixed hyperlipidemia, essential hypertension, chronic hyponatremia, GERD, tobacco use, and SIADH who presents today with dizziness and a headache.    1. Dizziness and Headache   -Improving  -Most likely secondary to Chronic Hyponatremia/SIADH  -Most recent BMP (on 10/09) revealed a Sodium of 134  -Patient is to monitor symptoms (e.g. Nausea, Dizziness, Headache)  -Patient is to monitor her blood pressure at home with a cuff  -Patient is to follow the recommendations of her nephrologist  -Continue Tylenol, as needed for pain  -Continue Thermotabs, one tablet daily  -Continue fluid restriction of 40 ounces, daily  -Ordered Basic Metabolic Panel; Future  -If dizziness is present at the next appointment consider Romberg test with an assistant; Dix-Hallpike may be considered if the exam room's layout permits proper execution of the maneuver  -Patient is to follow-up in 4 weeks  -Patient is to contact us in the interim with questions or concerns    2. Need for influenza vaccination  -Ordered Influenza, FLUCELVAX, (age 53 mo+), IM, Preservative Free, 0.5 mL    Health Maitenance:   -Discussed influenza vaccination    Health care  decision maker:  <66 years old      EMR Dragon/transcription disclaimer:  Much of this encounter note is electronic transcription/translation of spoken language to printed texts.  The electronic translation of spoken language may be erroneous, or at times,  nonsensical words or phrases may be inadvertently transcribed.  Although I have reviewed the note for such errors, some may still exist.     Janifer Adie, DO   PGY-1  [facility] Medicine Residency

## 2022-01-21 LAB — BASIC METABOLIC PANEL
Anion Gap: 8 (ref 3–16)
BUN: 9 mg/dL (ref 7–20)
CO2: 29 mmol/L (ref 21–32)
Calcium: 9.9 mg/dL (ref 8.3–10.6)
Chloride: 96 mmol/L — ABNORMAL LOW (ref 99–110)
Creatinine: 0.7 mg/dL (ref 0.6–1.1)
Est, Glom Filt Rate: >60 (ref >60)
Glucose: 78 mg/dL (ref 70–99)
Potassium: 4.5 mmol/L (ref 3.5–5.1)
Sodium: 133 mmol/L — ABNORMAL LOW (ref 136–145)

## 2022-01-22 NOTE — Telephone Encounter (Signed)
Left a VM for patient reminding her to schedule her mammogram

## 2022-02-20 ENCOUNTER — Inpatient Hospital Stay: Payer: PRIVATE HEALTH INSURANCE | Primary: Family Medicine

## 2022-02-20 ENCOUNTER — Ambulatory Visit
Admit: 2022-02-20 | Discharge: 2022-02-20 | Payer: PRIVATE HEALTH INSURANCE | Attending: Family Medicine | Primary: Family Medicine

## 2022-02-20 DIAGNOSIS — E222 Syndrome of inappropriate secretion of antidiuretic hormone: Secondary | ICD-10-CM

## 2022-02-20 LAB — BASIC METABOLIC PANEL
Anion Gap: 8 (ref 3–16)
BUN: 11 mg/dL (ref 7–20)
CO2: 28 mmol/L (ref 21–32)
Calcium: 10 mg/dL (ref 8.3–10.6)
Chloride: 96 mmol/L — ABNORMAL LOW (ref 99–110)
Creatinine: 0.7 mg/dL (ref 0.6–1.1)
Est, Glom Filt Rate: >60 (ref >60)
Glucose: 67 mg/dL — ABNORMAL LOW (ref 70–99)
Potassium: 4.4 mmol/L (ref 3.5–5.1)
Sodium: 132 mmol/L — ABNORMAL LOW (ref 136–145)

## 2022-02-20 LAB — ELECTROLYTES URINE RANDOM
Chloride: 148 mmol/L
Potassium, Ur: 42 mmol/L
Sodium, Ur: 129 mmol/L

## 2022-02-20 LAB — OSMOLALITY: Osmolality: 285 mOsm/kg (ref 275–295)

## 2022-02-20 LAB — OSMOLALITY, URINE: Osmolality, Ur: 650 mOsm/kg (ref 390–1070)

## 2022-02-20 NOTE — Progress Notes (Signed)
[place] [facility]                                  [address], [place] [address]         Phone: [phone]    Date of Service:  02/20/2022     Patient ID: .[name] is a 56 y.o. female      Subjective:     CC: Follow up Dizziness    HPI  Susan [name] 56 y.o. female has Seasonal allergies; Mixed hyperlipidemia; Essential hypertension; Chronic hyponatremia; Family history of elevated lipoprotein (a); ETD (eustachian tube dysfunction); Gastroesophageal reflux disease; Dry eyes; Prediabetes; Weight gain; Tobacco use; and SIADH (syndrome of inappropriate ADH production) ([place]) on their problem list.      SIADH  She feels that her dizziness and headaches have improved. She has been drinking more Pedialyte and adding salt to food. She continues to take thermodabs, 1 daily. She has been drinking over 40 oz of water a day. She tries to stay close to 40 oz. Headaches resolve with Tylenol or go away by themselves. She often eats or drinks something when she has a headache.  Dizziness non-positional.     Dry nose  With thea weather change she has noticed that her nose is dry. She has been using saline lubricating gel which seems to be helping.     Chronic Cough  Cough for the last 4 weeks. Dry. Her and her husband got sick on vacation. All her other symptoms have resolved but the cough lingers. She gets a tickle in her throat and then coughs.     Vaginal bleeding  Spotting after intercourse, only the one time. Menopausal, 5+ years. No other spotting episodes.     ROS:    Negative other than noted in the HPI        Vitals:    02/20/22 1037   BP: 134/78   Site: Left Upper Arm   Position: Sitting   Cuff Size: Small Adult   Pulse: 93   Resp: 18   Temp: 98.2 F (36.8 C)   TempSrc: Temporal   SpO2: 99%   Weight: 62.3 kg (137 lb 6.4 oz)   Height: 1.588 m (5' 2.5")       Allergies:  Food, Other, Prednazoline, Cipro xr, and  Sulfa antibiotics    Outpatient Medications Marked as Taking for the 02/20/22 encounter (Office Visit) with Huriel Matt K, DO   Medication Sig Dispense Refill    metoprolol succinate (TOPROL XL) 25 MG extended release tablet Take 1-1/2 (1.5) tablets a day. 45 tablet 2    thermotabs (MEDI-LYTE) TABS tablet Take 1 tablet by mouth daily 90 tablet 3    olmesartan (BENICAR) 40 MG tablet Take 1 tablet by mouth daily 90 tablet 3    levocetirizine (XYZAL) 5 MG tablet Take 1 tablet by mouth nightly 90 tablet 1    montelukast (SINGULAIR) 10 MG tablet Take 1 tablet by mouth nightly 90 tablet 1    azelastine HCl 0.15 % SOLN 1 spray by Each Nostril route daily USE 2 SPRAY(S) TWICE DAILY INTRANASALLY AS DIRECTED 23 mL 3    pantoprazole (PROTONIX) 40 MG tablet Take 1 tablet by mouth daily 90 tablet 1    rosuvastatin (CRESTOR) 20 MG tablet Take 1 tablet by mouth nightly 90 tablet  1    mometasone (NASONEX) 50 MCG/ACT nasal spray 2 sprays by Nasal route daily 3 each 3    EPINEPHrine (EPIPEN) 0.3 MG/0.3ML SOAJ injection USE 1 PEN INTRAMUSCULARLY AR NEEDED ONLY FOR SEVERE REACTION FOR 1 DAY 2 each 0    glycerin-hypromellose-PEG 400 (DRY EYE RELIEF DROPS) 0.2-0.2-1 % SOLN opthalmic solution Place 1 drop into both eyes as needed (as needed for dry eyes) 1 each 3    naproxen (NAPROSYN) 500 MG tablet Take 1 tablet by mouth 2 times daily (with meals) 14 tablet 0    aspirin 81 MG EC tablet Take 1 tablet by mouth daily           Objective:   Physical Exam  Constitutional:       Appearance: Normal appearance.   HENT:      Head: Normocephalic and atraumatic.      Nose: Nose normal. No congestion or rhinorrhea.   Eyes:      Extraocular Movements: Extraocular movements intact.      Conjunctiva/sclera: Conjunctivae normal.      Pupils: Pupils are equal, round, and reactive to light.   Cardiovascular:      Rate and Rhythm: Normal rate and regular rhythm.      Pulses: Normal pulses.      Heart sounds: Normal heart sounds.   Pulmonary:       Effort: Pulmonary effort is normal.      Breath sounds: Normal breath sounds.   Musculoskeletal:         General: Normal range of motion.   Skin:     General: Skin is warm and dry.   Neurological:      General: No focal deficit present.      Mental Status: She is alert and oriented to person, place, and time.   Psychiatric:         Mood and Affect: Mood normal.         Behavior: Behavior normal.           Assessment / Plan:   [name] 56 y.o. female has Seasonal allergies; Mixed hyperlipidemia; Essential hypertension; Chronic hyponatremia; Family history of elevated lipoprotein (a); ETD (eustachian tube dysfunction); Gastroesophageal reflux disease; Dry eyes; Prediabetes; Weight gain; Tobacco use; and SIADH (syndrome of inappropriate ADH production) ([facility]) on their problem list.      1. SIADH (syndrome of inappropriate ADH production) ([facility])  Unclear control, Continue fluid restriction, 40 oz, and 1 thermotab daily. Plan pending work up below.   Discussed the importance of consistent fluid intake and salt consumption in maintaining sodium and avoiding hyponatremia. Symptoms of dizziness and headache most likely related to SIADH as symptoms improve with eating salt and drinking pedialyte.   - Basic Metabolic Panel; Future  - ELECTROLYTES URINE RANDOM; Future  - OSMOLALITY, URINE; Future  - Osmolality, Serum; Future    2. Vaginal spotting  Unclear etiology, as this was a single episode will defer further work up at this time. Instructed patient to call if bleeding reoccurs. If consistently bleeding after intercourse consider cervical polyps, if post-menopausal spotting consider endometrial thickening.     3. Dry nose  Uncontrolled, recommend continued use of nasal saline lubricant and use of humidifier    4. Chronic cough  Not at goal, counseled patient on typical course of post viral cough syndrome and offered reassurance. Patient is not seeking additional medication management at this time but wanted to ensure  that there was nothing concerning regarding her illness  course.     Health maintenance  Patient states that she had a PAP over a year ago. No records available since moving. Recommend that she schedule PAP at next convenience.     Return in about 1 month (around 03/22/2022) for SIADH.         EMR Dragon/transcription disclaimer:  Much of this encounter note is electronic transcription/translation of spoken language to printed texts.  The electronic translation of spoken language Viggo Perko be erroneous, or at times, nonsensical words or phrases Preet Mangano be inadvertently transcribed.  Although I have reviewed the note for such errors, some Debora Stockdale still exist.

## 2022-02-20 NOTE — Patient Instructions (Signed)
Schedule Pap next convenience

## 2022-02-25 NOTE — Addendum Note (Signed)
Addended by: Hoy Finlay on: 02/25/2022 03:07 PM     Modules accepted: Orders

## 2022-02-26 ENCOUNTER — Inpatient Hospital Stay: Payer: PRIVATE HEALTH INSURANCE | Primary: Family Medicine

## 2022-02-26 ENCOUNTER — Encounter

## 2022-02-26 DIAGNOSIS — E222 Syndrome of inappropriate secretion of antidiuretic hormone: Secondary | ICD-10-CM

## 2022-02-26 LAB — CORTISOL AM, TOTAL: Cortisol - AM: 12 ug/dL (ref 4.3–22.4)

## 2022-02-27 NOTE — Addendum Note (Signed)
Addended by: Hoy Finlay on: 02/27/2022 08:26 AM     Modules accepted: Orders

## 2022-03-04 LAB — ACTH: ACTH: 17 pg/mL (ref 6–58)

## 2022-03-06 ENCOUNTER — Inpatient Hospital Stay: Admit: 2022-03-06 | Discharge: 2022-03-06 | Payer: PRIVATE HEALTH INSURANCE | Primary: Family Medicine

## 2022-03-06 DIAGNOSIS — E222 Syndrome of inappropriate secretion of antidiuretic hormone: Secondary | ICD-10-CM

## 2022-03-06 LAB — ACTH STIMULATION PANEL
Cortisol, 30 Min: 27 ug/dL
Cortisol, 60 Min: 29.6 ug/dL
Cortisol, Base: 13.7 ug/dL

## 2022-03-06 MED ORDER — COSYNTROPIN 0.25 MG IJ SOLR
0.25 MG | Freq: Once | INTRAMUSCULAR | Status: AC
Start: 2022-03-06 — End: 2022-03-06
  Administered 2022-03-06: 14:00:00 250 ug via INTRAVENOUS

## 2022-03-06 MED FILL — CORTROSYN 0.25 MG IJ SOLR: 0.25 MG | INTRAMUSCULAR | Qty: 250

## 2022-03-06 NOTE — Progress Notes (Signed)
Pt seen and assessed [facility] today for ACTH stimulation panel per orders from Dr. May. Labs drawn per protocol prior to cosyntropin administration and 30 min/60 min post. Pt tolerated well and without incident.  Pt verbalizes understanding of discharge instructions.  Discharged ambulatory to home.

## 2022-03-18 ENCOUNTER — Inpatient Hospital Stay: Payer: BLUE CROSS/BLUE SHIELD

## 2022-03-18 ENCOUNTER — Inpatient Hospital Stay: Admit: 2022-03-18 | Payer: PRIVATE HEALTH INSURANCE | Primary: Family Medicine

## 2022-03-18 ENCOUNTER — Encounter
Admit: 2022-03-18 | Discharge: 2022-03-18 | Payer: PRIVATE HEALTH INSURANCE | Attending: Family Medicine | Primary: Family Medicine

## 2022-03-18 DIAGNOSIS — E871 Hypo-osmolality and hyponatremia: Secondary | ICD-10-CM

## 2022-03-18 DIAGNOSIS — R053 Chronic cough: Secondary | ICD-10-CM

## 2022-03-18 LAB — BASIC METABOLIC PANEL
Anion Gap: 8 (ref 3–16)
BUN: 8 mg/dL (ref 7–20)
CO2: 26 mmol/L (ref 21–32)
Calcium: 9.3 mg/dL (ref 8.3–10.6)
Chloride: 98 mmol/L — ABNORMAL LOW (ref 99–110)
Creatinine: 0.8 mg/dL (ref 0.6–1.1)
Est, Glom Filt Rate: >60 (ref >60)
Glucose: 103 mg/dL — ABNORMAL HIGH (ref 70–99)
Potassium: 4.8 mmol/L (ref 3.5–5.1)
Sodium: 132 mmol/L — ABNORMAL LOW (ref 136–145)

## 2022-03-18 MED ORDER — METOPROLOL SUCCINATE ER 25 MG PO TB24
25 MG | ORAL_TABLET | ORAL | 3 refills | Status: DC
Start: 2022-03-18 — End: 2022-07-21

## 2022-03-18 MED ORDER — OLMESARTAN MEDOXOMIL 40 MG PO TABS
40 MG | ORAL_TABLET | Freq: Every day | ORAL | 3 refills | Status: DC
Start: 2022-03-18 — End: 2023-01-09

## 2022-03-18 MED ORDER — ROSUVASTATIN CALCIUM 20 MG PO TABS
20 MG | ORAL_TABLET | Freq: Every evening | ORAL | 3 refills | Status: DC
Start: 2022-03-18 — End: 2023-01-09

## 2022-03-18 MED ORDER — THERMOTABS PO TABS
ORAL_TABLET | Freq: Every day | ORAL | 3 refills | Status: DC
Start: 2022-03-18 — End: 2023-01-09

## 2022-03-18 MED ORDER — PANTOPRAZOLE SODIUM 40 MG PO TBEC
40 MG | ORAL_TABLET | Freq: Every day | ORAL | 3 refills | Status: DC
Start: 2022-03-18 — End: 2023-01-09

## 2022-03-18 MED ORDER — AZELASTINE HCL 0.15 % NA SOLN
0.15 % | Freq: Every day | NASAL | 3 refills | Status: DC
Start: 2022-03-18 — End: 2023-01-09

## 2022-03-18 MED ORDER — MONTELUKAST SODIUM 10 MG PO TABS
10 MG | ORAL_TABLET | Freq: Every evening | ORAL | 3 refills | Status: DC
Start: 2022-03-18 — End: 2023-01-09

## 2022-03-18 MED ORDER — MOMETASONE FUROATE 50 MCG/ACT NA SUSP
50 MCG/ACT | Freq: Every day | NASAL | 3 refills | Status: DC
Start: 2022-03-18 — End: 2023-01-09

## 2022-03-18 MED ORDER — LEVOCETIRIZINE DIHYDROCHLORIDE 5 MG PO TABS
5 MG | ORAL_TABLET | Freq: Every evening | ORAL | 3 refills | Status: DC
Start: 2022-03-18 — End: 2023-01-09

## 2022-03-18 NOTE — Progress Notes (Signed)
PROGRESS NOTE   [facility]                                  [address], [address], [place] [address]         Phone: [phone]    Date of Service:  03/18/2022     Patient ID: .Susan [name] is a 56 y.o. female      Subjective:     CC: SIADH    HPI  Susan [name] 56 y.o. female has Seasonal allergies; Mixed hyperlipidemia; Essential hypertension; Chronic hyponatremia; Family history of elevated lipoprotein (a); ETD (eustachian tube dysfunction); Gastroesophageal reflux disease; Dry eyes; Prediabetes; Weight gain; Tobacco use; and SIADH (syndrome of inappropriate ADH production) ([facility]) on their problem list.      She would like her sodium checked today. She had a few episodes of dizziness and headache over the weekend. She has been drinking close to 40 oz of water daily. Next nephrology appointment Jan. 18th.     Continues to have cough since the end of October. She has not had a temp equal to or greater than 100.4. She has a productive cough. She has sinus congestion. She denies reflux symptoms. She continues to take pantoprazole daily. Currently smoking less than a half a pack per day. She has been using nasal saline, humidifier. She has been avoiding Mucinex due to concerns for dehydration. The patient denies chest pain, dyspnea, wheezing or hemoptysis. She has allergies year round and some degree of chronic sinus congestion but current symptoms worse than baseline. Denies sinus drainage. Mild sinus pressure.     Recent labs reviewed with patient today.     ROS:    Negative other than noted in the hpi        Vitals:    03/18/22 1049   BP: 120/76   Site: Right Upper Arm   Position: Sitting   Cuff Size: Medium Adult   Pulse: 92   Temp: 97.3 F (36.3 C)   TempSrc: Infrared   SpO2: 98%   Weight: 61.6 kg (135 lb 12.8 oz)       Allergies:  Food, Other, Prednazoline, Cipro xr, and Sulfa antibiotics    Outpatient Medications Marked as  Taking for the 03/18/22 encounter (Office Visit) with Fedora Knisely K, DO   Medication Sig Dispense Refill    thermotabs (MEDI-LYTE) TABS tablet Take 1 tablet by mouth daily 90 tablet 3    rosuvastatin (CRESTOR) 20 MG tablet Take 1 tablet by mouth nightly 90 tablet 3    pantoprazole (PROTONIX) 40 MG tablet Take 1 tablet by mouth daily 90 tablet 3    olmesartan (BENICAR) 40 MG tablet Take 1 tablet by mouth daily 90 tablet 3    montelukast (SINGULAIR) 10 MG tablet Take 1 tablet by mouth nightly 90 tablet 3    metoprolol succinate (TOPROL XL) 25 MG extended release tablet Take 1-1/2 (1.5) tablets a day. 45 tablet 3    mometasone (NASONEX) 50 MCG/ACT nasal spray 2 sprays by Nasal route daily 3 each 3    levocetirizine (XYZAL) 5 MG tablet Take 1 tablet by mouth nightly 90 tablet 3    azelastine HCl 0.15 % SOLN 1 spray by Each Nostril route daily USE 2 SPRAY(S) TWICE DAILY INTRANASALLY AS DIRECTED 23 mL 3    EPINEPHrine (EPIPEN) 0.3 MG/0.3ML SOAJ injection USE  1 PEN INTRAMUSCULARLY AR NEEDED ONLY FOR SEVERE REACTION FOR 1 DAY 2 each 0    glycerin-hypromellose-PEG 400 (DRY EYE RELIEF DROPS) 0.2-0.2-1 % SOLN opthalmic solution Place 1 drop into both eyes as needed (as needed for dry eyes) 1 each 3    naproxen (NAPROSYN) 500 MG tablet Take 1 tablet by mouth 2 times daily (with meals) 14 tablet 0    aspirin 81 MG EC tablet Take 1 tablet by mouth daily           Objective:   Physical Exam  Constitutional:       Appearance: Normal appearance.   HENT:      Head: Normocephalic and atraumatic.      Nose: Nose normal. No congestion or rhinorrhea.   Eyes:      Extraocular Movements: Extraocular movements intact.      Conjunctiva/sclera: Conjunctivae normal.      Pupils: Pupils are equal, round, and reactive to light.   Cardiovascular:      Rate and Rhythm: Normal rate and regular rhythm.      Pulses: Normal pulses.      Heart sounds: Normal heart sounds.   Pulmonary:      Effort: Pulmonary effort is normal.      Breath sounds:  Normal breath sounds. No wheezing or rhonchi.   Musculoskeletal:         General: Normal range of motion.   Skin:     General: Skin is warm and dry.   Neurological:      General: No focal deficit present.      Mental Status: She is alert and oriented to person, place, and time.   Psychiatric:         Mood and Affect: Mood normal.         Behavior: Behavior normal.             Assessment / Plan:   [name] 56 y.o. female has Seasonal allergies; Mixed hyperlipidemia; Essential hypertension; Chronic hyponatremia; Family history of elevated lipoprotein (a); ETD (eustachian tube dysfunction); Gastroesophageal reflux disease; Dry eyes; Prediabetes; Weight gain; Tobacco use; and SIADH (syndrome of inappropriate ADH production) ([place]) on their problem list.      Hyponatremia  Due to SIADH, plan pending repeat BNP. ACTH stim, AM cortisol, urine OSM, urine electrolytes all wnl.   - Discussed the importance of fluid restriction and measuring fluid intake. Recommend decreasing to 32 oz limit if continued hyponatremia.   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 Low suspicion for infectious etiology based on hx and physical. Low suspicion for asthma or COPD however if symptoms fail to improve in the next 1-2 months consider PFTs. Smoking likely contributing to symptoms.   - Continue allergy medications and PPI  - XR CHEST STANDARD (2 VW); Future    Return in about 1 month (around 04/18/2022).         EMR Dragon/transcription disclaimer:  Much of this encounter note is electronic transcription/translation of spoken language to printed texts.  The electronic translation of spoken language Xitlally Mooneyham be erroneous, or at times, nonsensical words or phrases Demarkus Remmel be inadvertently transcribed.  Although I have reviewed the  note for such errors, some Oluwatoyin Banales still exist.

## 2022-04-10 NOTE — Progress Notes (Signed)
Formatting of this note is different from the original.  Images from the original note were not included.        Interval History/Plan:      On salt tablet once a day  Blood pressure is controlled olmesartan 40 mg a day, metoprolol 25 mg XL a day continue on that   Sodium 132 from 03/18/2022   Potassium 4.8   She is feeling not very well and feeling weird on  her head Will check lab as soon as possible today to ensure she has not very hyponatremic  Also she is on Thermotabs instead of that will switch to salt tablets 1 g twice a day   Follow-up labs and follow-up with me in another 6 moths    Orders Placed This Encounter   Procedures   ? Basic Metabolic Panel (BMP=ep1)   ? Basic Metabolic Panel (BMP=ep1)     Assessment:     # Hyponatremia  Chronic since 2015   Has been to hospital multiple times needing IV fluids   First detected in 2018   Her sodium dropped to 123 after being on hydrochlorothiazide  Hydrochlorothiazide has been stopped at the time of initial presentation   She is a smoker has had workup with a CT scan of the chest and that was normal  Now on daily salt tablet, Pedialyte, also watching fluid fluid intake  No history of alcoholism    # Hypertension  BP Readings from Last 1 Encounters:   04/10/22 132/70   Blood pressure tends to be variable   B 093 systolic 20 50   On metoprolol  Also on olmesartan   Was on calcium channel blocker in the past causing edema     Susan [name], M.D      Susan [name]   1965/09/27      Referring/Primary care Physician: [name], Susan    HPI:  Susan [name] is a 57 y.o. female is seen in the office for   for  hyponatremia  Noted at First visit at office on 12/10/2021:   She is known to have hyponatremia for more than 5 years   Also has hypertension for a while   On 2 medications also was on hydrochlorothiazide which has been stopped recently due to hyponatremia with sodium as low as 123   Does does not take alcohol on a regular basis but smokes half pack a  day    Accompanied by  no family  Lives at [employer] area with husband has grown-up children   Previous or current Occupation:  Working    Interval history:    PMH/SH/FH:     No past medical history on file.  Hypertension   has no past surgical history on file.      Exam:      Current Outpatient Medications   Medication Sig Dispense Refill   ? Levocetirizine (Xyzal) 5 mg Tablet Take 1 Tablet by mouth nightly.     ? metoprolol succinate (TOPROL) 25 mg XL tablet TAKE 1 & 1/2 (ONE & ONE-HALF) TABLETS BY MOUTH ONCE DAILY     ? montelukast (SINGULAIR) 10 mg Tablet Take 1 Tablet by mouth nightly.     ? Olmesartan 40 mg Tablet Take 40 mg by mouth daily.     ? pantoprazole (PROTONIX) 40 mg Tablet, Delayed Release (E.C.) Take 40 mg by mouth daily.     ? rosuvastatin (CRESTOR) 20 mg Tablet Take 20 mg by mouth nightly.     ?  Sodium Chloride 1,000 mg Tablet, Soluble tablet Take 1 Tablet (1 g) by mouth 2 times daily (with meals). 180 Tablet 3   ? Thermotabs 287-180-15 mg Tablet Take 1 Tablet by mouth daily.       No current facility-administered medications for this visit.     Allergies   Allergen Reactions   ? Ciprofloxacin Anaphylaxis and Rash   ? Prednazoline      Other reaction(s): Headaches  Prednisones Makes bp rise    ? Sulfa (Sulfonamide Antibiotics) Other (See Comments)     Pt states she collapsed and was admitted to hospital       Vital Signs:      BP 132/70   Pulse 77   Ht 5\' 2"  (1.575 m)   Wt 132 lb (59.9 kg)   SpO2 97%   BMI 24.14 kg/m     Exam:      General appearance: alert, orientated, pleasant  Respiratory: no gross abnormality  Cardiovascular: no raised JVD, Edema 0    Abdomen: -  Soft, NT, ND  Other relevant findings: -          Electronically signed by Susan Lo, MD at 04/10/2022  2:14 PM EST

## 2022-05-01 ENCOUNTER — Ambulatory Visit: Admit: 2022-05-01 | Discharge: 2022-05-01 | Payer: PRIVATE HEALTH INSURANCE | Primary: Family Medicine

## 2022-05-01 ENCOUNTER — Inpatient Hospital Stay: Payer: BLUE CROSS/BLUE SHIELD

## 2022-05-01 DIAGNOSIS — E222 Syndrome of inappropriate secretion of antidiuretic hormone: Secondary | ICD-10-CM

## 2022-05-01 LAB — BASIC METABOLIC PANEL
Anion Gap: 13 (ref 3–16)
BUN: 11 mg/dL (ref 7–20)
CO2: 26 mmol/L (ref 21–32)
Calcium: 9 mg/dL (ref 8.3–10.6)
Chloride: 98 mmol/L — ABNORMAL LOW (ref 99–110)
Creatinine: 0.8 mg/dL (ref 0.6–1.1)
Est, Glom Filt Rate: >60 (ref >60)
Glucose: 89 mg/dL (ref 70–99)
Potassium: 4.4 mmol/L (ref 3.5–5.1)
Sodium: 137 mmol/L (ref 136–145)

## 2022-05-01 NOTE — Progress Notes (Cosign Needed)
PROGRESS NOTE   [facility] Medicine Residency Practice                                  [address], [place] [address]         Phone: [phone]    Date of Service:  05/01/2022     Patient ID: .Susan [name] is a 57 y.o. female      Subjective:     CC:   Chief Complaint   Patient presents with    Follow-up     States that she was supposed to follow up, states that she saw nephrology recently and they changed her medications and her fluid restrictions     Pain     Pain in neck, back, related to prior DX of DDD, states that she has nerve pain that runs down her whole back. Swelling/  pain in lower legs    Otalgia     States that she has can hear water popping in her right ear, states that she has a HX of fluid being trapped in her ear due to narrow ear canal    Other     States that her right eye waters constantly when she lays down     HPI  Patient is a 57 year old female presenting today for follow-up regarding chronic conditions as well as discuss leg pain as well as your concerns.  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    SIADH  Patient is currently established with nephrology.  Per last nephrology note, patient is no longer on Thermotabs and has been started on sodium chloride tablets instead.  Patient states that she has gained approximately 3 pounds since last visit without any dietary changes.  Patient is compliant with her metoprolol as well as on losartan for management of her hypertension.  Patient's new water restriction of 62 ounces per nephrology recommendations.  Patient is also requesting repeat blood work per nephrology recommendation.    Leg cramps/back pain  Patient states that she has a history of cervical degenerative disc disease, spondylosis, as well as neuroforaminal impingement.  Patient states that she saw physical therapist many years ago and [place], but has not been followed by physical therapy since then.   Patient states that the pain is located usually in the left side of her lower back as well as the right shoulder pain.  She describes the pain as a dull achy pain.  She states that it is worse when sitting for too long.  She does use some heating/ice packs at home for improvement of her symptoms.    Ear concerns  Patient with a history of narrow ear canal.  Patient states that since for the last couple weeks she feels like her ear has been feeling like it is clogged.  She also feels like there is a sensation of a bubble popping in her ears.  She did state that she recently got over a viral infection with some sinus congestion.  States that the symptoms are improving but did want to get a second look at it.  Patient denies any pain in her ear at this time.  Denies any drainage from her ear as well    Right eye  Patient also states that she has been experiencing some increased tearfulness from her right eye.  She states that she has been using  eyedrops for dry eyes in the past, but it has been holding on those recently.  In addition to that patient does have a history of having congested tear ducts.  She was previously seen by a ophthalmologist for cleaning her tear ducts.  Denies any additional concerns including vision changes, headaches, nausea.    ROS:    Review of Systems   Constitutional:  Negative for chills, fatigue and fever.   HENT:  Positive for congestion. Negative for ear discharge, ear pain, hearing loss, rhinorrhea, sinus pain and tinnitus.    Eyes:  Negative for pain, redness, itching and visual disturbance.   Respiratory:  Negative for cough, shortness of breath and wheezing.    Cardiovascular:  Negative for chest pain, palpitations and leg swelling.   Gastrointestinal:  Negative for constipation, diarrhea, nausea and vomiting.   Genitourinary:  Negative for difficulty urinating, dysuria, flank pain and hematuria.   Musculoskeletal:  Positive for back pain.   Neurological:  Positive for dizziness.  Negative for weakness, light-headedness, numbness and headaches.           Vitals:    05/01/22 1005   BP: 129/79   Site: Right Upper Arm   Position: Sitting   Cuff Size: Large Adult   Pulse: 79   Resp: 17   SpO2: 99%   Weight: 62.6 kg (138 lb)   Height: 1.588 m (5' 2.5")       Allergies:  Food, Other, Prednazoline, Cipro xr, and Sulfa antibiotics    Outpatient Medications Marked as Taking for the 05/01/22 encounter (Office Visit) with Clarnce Flock, DO   Medication Sig Dispense Refill    rosuvastatin (CRESTOR) 20 MG tablet Take 1 tablet by mouth nightly 90 tablet 3    pantoprazole (PROTONIX) 40 MG tablet Take 1 tablet by mouth daily 90 tablet 3    olmesartan (BENICAR) 40 MG tablet Take 1 tablet by mouth daily 90 tablet 3    montelukast (SINGULAIR) 10 MG tablet Take 1 tablet by mouth nightly 90 tablet 3    metoprolol succinate (TOPROL XL) 25 MG extended release tablet Take 1-1/2 (1.5) tablets a day. 45 tablet 3    mometasone (NASONEX) 50 MCG/ACT nasal spray 2 sprays by Nasal route daily 3 each 3    levocetirizine (XYZAL) 5 MG tablet Take 1 tablet by mouth nightly 90 tablet 3    azelastine HCl 0.15 % SOLN 1 spray by Each Nostril route daily USE 2 SPRAY(S) TWICE DAILY INTRANASALLY AS DIRECTED 23 mL 3    EPINEPHrine (EPIPEN) 0.3 MG/0.3ML SOAJ injection USE 1 PEN INTRAMUSCULARLY AR NEEDED ONLY FOR SEVERE REACTION FOR 1 DAY 2 each 0    aspirin 81 MG EC tablet Take 1 tablet by mouth daily           Objective:   Physical Exam  Constitutional:       Appearance: Normal appearance.   HENT:      Head: Normocephalic.      Right Ear: Tympanic membrane and ear canal normal.      Left Ear: Tympanic membrane, ear canal and external ear normal.      Ears:      Comments: Slight erythema of the right external ear canal, with some pain with otoscopic evaluation, no other abnormal findings noted  Eyes:      Conjunctiva/sclera: Conjunctivae normal.   Cardiovascular:      Rate and Rhythm: Normal rate and regular rhythm.      Pulses:  Normal pulses.  Heart sounds: Normal heart sounds.   Pulmonary:      Effort: Pulmonary effort is normal.      Breath sounds: Normal breath sounds.   Abdominal:      General: Abdomen is flat. Bowel sounds are normal.      Palpations: Abdomen is soft.   Skin:     General: Skin is warm.   Neurological:      General: No focal deficit present.      Mental Status: She is alert and oriented to person, place, and time.             Assessment / Plan:     1. SIADH (syndrome of inappropriate ADH production) (HCC)  Stable  Patient was advised to continue following with nephrology  Patient was also advised to strictly adhere to 62 ounces fluid restriction as well as utilizing sodium tablets and blood pressure medications as prescribed by nephrology  BMP ordered to assess for electrolyte management  - Basic Metabolic Panel; Future    2. Chronic midline back pain, unspecified back location  Ongoing concern  Likely secondary to cervical degenerative disc disease, spondylosis, and or neural foraminal impingement  Patient was referred to [facility] back and spine for physical therapy  Should patient's symptoms not improve, can consider back and spine orthopedic referral  - [facility] - Physical Therapy    3. Congestion of right ear  Likely secondary to postviral congestive state  Patient was advised on conservative therapy utilizing warm compress on the eyes, humidifier, as well as nasal saline/irrigation  Patient was advised to return to clinic should she have any worsening of symptoms or any additional concerns    Return in about 3 months (around 07/30/2022).          EMR Dragon/transcription disclaimer:  Much of this encounter note is electronic transcription/translation of spoken language to printed texts.  The electronic translation of spoken language may be erroneous, or at times, nonsensical words or phrases may be inadvertently transcribed.  Although I have reviewed the note for such errors, some may still  exist.

## 2022-07-21 ENCOUNTER — Encounter

## 2022-07-21 NOTE — Telephone Encounter (Signed)
Refill Request   metoprolol succinate (TOPROL XL) 25 MG extended release tablet     Last Seen: Last Seen Department: 05/01/2022  Last Seen by PCP: 03/18/2022    Last Written: 03/18/22    Next Appointment:   Future Appointments   Date Time Provider Department Center   08/11/2022 10:40 AM May, Magdelene K, DO [facility]       Future appointment scheduled      Requested Prescriptions      No prescriptions requested or ordered in this encounter      metoprolol succinate (TOPROL XL) 25 MG extended release tablet [[id number]]    Order Details  Dose, Route, Frequency: As Directed   Dispense Quantity: 45 tablet Refills: 3          Sig: Take 1-1/2 (1.5) tablets a day.         Start Date: 03/18/22 End Date: --   Written Date: 03/18/22 Expiration Date: 03/18/23       Associated Diagnoses: Essential hypertension [I10]   Original Order: metoprolol succinate (TOPROL XL) 25 MG extended release tablet [[id number]]   Providers    Authorizing Provider: May, Magdelene K, [place]  NPI: [id number]   Supervising Provider: Aundra Dubin, MD NPI: [id number]   Ordering User: May, Magdelene K, DO          Pharmacy    [facility] [address] [place] - [address] - P [phone] - F [phone]

## 2022-07-22 MED ORDER — METOPROLOL SUCCINATE ER 25 MG PO TB24
25 | ORAL_TABLET | ORAL | 3 refills | Status: AC
Start: 2022-07-22 — End: ?

## 2022-08-11 ENCOUNTER — Inpatient Hospital Stay: Payer: BLUE CROSS/BLUE SHIELD | Primary: Family Medicine

## 2022-08-11 ENCOUNTER — Ambulatory Visit
Admit: 2022-08-11 | Discharge: 2022-08-11 | Payer: BLUE CROSS/BLUE SHIELD | Attending: Family Medicine | Primary: Family Medicine

## 2022-08-11 DIAGNOSIS — R252 Cramp and spasm: Secondary | ICD-10-CM

## 2022-08-11 DIAGNOSIS — M549 Dorsalgia, unspecified: Secondary | ICD-10-CM

## 2022-08-11 LAB — CBC WITH AUTO DIFFERENTIAL
Basophils %: 0.9 %
Basophils Absolute: 0.1 10*3/uL (ref 0.0–0.2)
Eosinophils %: 2.9 %
Eosinophils Absolute: 0.3 10*3/uL (ref 0.0–0.6)
Hematocrit: 40.2 % (ref 36.0–48.0)
Hemoglobin: 13.5 g/dL (ref 12.0–16.0)
Lymphocytes %: 20.8 %
Lymphocytes Absolute: 1.9 10*3/uL (ref 1.0–5.1)
MCH: 31.4 pg (ref 26.0–34.0)
MCHC: 33.5 g/dL (ref 31.0–36.0)
MCV: 93.7 fL (ref 80.0–100.0)
MPV: 7.9 fL (ref 5.0–10.5)
Monocytes %: 9 %
Monocytes Absolute: 0.8 10*3/uL (ref 0.0–1.3)
Neutrophils %: 66.4 %
Neutrophils Absolute: 5.9 10*3/uL (ref 1.7–7.7)
Platelets: 388 10*3/uL (ref 135–450)
RBC: 4.29 M/uL (ref 4.00–5.20)
RDW: 14.6 % (ref 12.4–15.4)
WBC: 8.9 10*3/uL (ref 4.0–11.0)

## 2022-08-11 LAB — FERRITIN: Ferritin: 27.5 ng/mL (ref 15.0–150.0)

## 2022-08-11 MED ORDER — OLOPATADINE HCL 0.1 % OP SOLN
0.1 | Freq: Two times a day (BID) | OPHTHALMIC | 0 refills | Status: AC
Start: 2022-08-11 — End: 2022-09-10

## 2022-08-11 NOTE — Patient Instructions (Addendum)
Recommend magnesium supplement. DO NOT take magnesium citrate as it will cause diarrhea. Recommend CALM magnesium supplement.

## 2022-08-11 NOTE — Progress Notes (Signed)
PROGRESS NOTE   [facility] Medicine Residency Practice                                  [address], [address], [place] [address]         Phone: [phone]    Date of Service:  08/11/2022     Patient ID: .Susan [name] is a 57 y.o. female      Subjective:     CC: Follow up SIADH, back pain.     HPI  Susan [name] 57 y.o. female has Seasonal allergies; Mixed hyperlipidemia; Essential hypertension; Chronic hyponatremia; Family history of elevated lipoprotein (a); ETD (eustachian tube dysfunction); Gastroesophageal reflux disease; Dry eyes; Prediabetes; Weight gain; Tobacco use; and SIADH (syndrome of inappropriate ADH production) (HCC) on their problem list.      SIADH  Currently taking sodium chloride 2 g a day, 1g BID. She has been drinking 64 oz of fluid and not over 70 oz.     Back pain  She was unable to go to physical therapy. She has been trying to call. Overall her pain is unchanged from her prior visit. She has not been taking any medication regularly for pain.     Right great toe ingrown toenail. She would like to see podiatry. Her toe is not very painful today but she feels that her toe nail grows crooked ever since an injury many years ago.     Seasonal allergies  Her symptoms of itchy, watery eyes, sinus congestion, rhinorrhea are quite bothersome. She states that she has always had bad allergies and has tried everything. She is using nasonex and Singulair currently.     Leg cramps  She continues to have restless leg feeling and occasional calf cramps. It feels better when she moves around.     ROS:    Negative other than noted in HPI        Vitals:    08/11/22 1040 08/11/22 1046 08/11/22 1111   BP: (!) 144/64 (!) 140/70 138/70   Site: Left Upper Arm Right Upper Arm    Position: Sitting Sitting    Cuff Size: Medium Adult Medium Adult    Pulse: 76     Temp: 98.4 F (36.9 C)     TempSrc: Temporal     SpO2: 98%     Weight: 63 kg (139 lb)          Allergies:  Food, Other, Prednazoline, Cipro xr, and Sulfa antibiotics    Outpatient Medications Marked as Taking for the 08/11/22 encounter (Office Visit) with Brook Geraci K, DO   Medication Sig Dispense Refill    olopatadine (PATADAY) 0.1 % ophthalmic solution Place 1 drop into both eyes 2 times daily 1 each 0    metoprolol succinate (TOPROL XL) 25 MG extended release tablet Take 1-1/2 (1.5) tablets a day. 45 tablet 3    sodium chloride 1 g tablet Take 1 tablet by mouth 2 times daily (with meals)      rosuvastatin (CRESTOR) 20 MG tablet Take 1 tablet by mouth nightly 90 tablet 3    pantoprazole (PROTONIX) 40 MG tablet Take 1 tablet by mouth daily 90 tablet 3    olmesartan (BENICAR) 40 MG tablet Take 1 tablet by mouth daily 90 tablet 3    montelukast (SINGULAIR) 10 MG tablet Take 1 tablet by mouth nightly 90 tablet  3    mometasone (NASONEX) 50 MCG/ACT nasal spray 2 sprays by Nasal route daily 3 each 3    levocetirizine (XYZAL) 5 MG tablet Take 1 tablet by mouth nightly 90 tablet 3    azelastine HCl 0.15 % SOLN 1 spray by Each Nostril route daily USE 2 SPRAY(S) TWICE DAILY INTRANASALLY AS DIRECTED 23 mL 3    EPINEPHrine (EPIPEN) 0.3 MG/0.3ML SOAJ injection USE 1 PEN INTRAMUSCULARLY AR NEEDED ONLY FOR SEVERE REACTION FOR 1 DAY 2 each 0    aspirin 81 MG EC tablet Take 1 tablet by mouth daily           Objective:   Physical Exam  Constitutional:       Appearance: Normal appearance.   HENT:      Head: Normocephalic and atraumatic.      Nose: Nose normal. No congestion or rhinorrhea.      Mouth/Throat:      Pharynx: Posterior oropharyngeal erythema (mild w/ cobblestoning) present.   Eyes:      Extraocular Movements: Extraocular movements intact.      Conjunctiva/sclera: Conjunctivae normal.      Pupils: Pupils are equal, round, and reactive to light.   Cardiovascular:      Rate and Rhythm: Normal rate and regular rhythm.      Pulses: Normal pulses.      Heart sounds: Normal heart sounds.   Pulmonary:      Effort:  Pulmonary effort is normal.      Breath sounds: Normal breath sounds.   Musculoskeletal:         General: Normal range of motion.   Skin:     General: Skin is warm and dry.      Comments: No erythema or edema about nail bed right great toe   Neurological:      General: No focal deficit present.      Mental Status: She is alert and oriented to person, place, and time.   Psychiatric:         Mood and Affect: Mood normal.         Behavior: Behavior normal.             Assessment / Plan:   Susan [name] 57 y.o. female has Seasonal allergies; Mixed hyperlipidemia; Essential hypertension; Chronic hyponatremia; Family history of elevated lipoprotein (a); ETD (eustachian tube dysfunction); Gastroesophageal reflux disease; Dry eyes; Prediabetes; Weight gain; Tobacco use; and SIADH (syndrome of inappropriate ADH production) (HCC) on their problem list.      1. Chronic midline back pain, unspecified back location  Not at goal, recommend physical therapy. If no improvement with this consider imaging.   - [facility] [place] [facility]    2. Seasonal allergies  Not at goal, continue nasonex and singulair. Add pataday eye drops for eye symptoms.   - olopatadine (PATADAY) 0.1 % ophthalmic solution; Place 1 drop into both eyes 2 times daily  Dispense: 1 each; Refill: 0    3. Leg cramps  Not at goal, Recommend magnesium supplement. Xan Ingraham be due to low iron, ferritin pending. Plan pending results.   - CBC with Auto Differential; Future  - Ferritin; Future    4. Great toe pain, right  No infection or inflammation or signs infection today. See podiatry.   - [facility] - [facility] Must, Alvira Philips, DPM, Podiatry, [place]    5. Encounter for screening mammogram for malignant neoplasm of breast  - MAM DIGITAL SCREEN W OR WO CAD  BILATERAL; Future    6. SIADH (syndrome of inappropriate ADH production) (HCC)  Managed by specialist. Recommend continued follow up and adherence to Na supplement and fluid restriction.     [facility]:   Return Next available for PAP.           EMR Dragon/transcription disclaimer:  Much of this encounter note is electronic transcription/translation of spoken language to printed texts.  The electronic translation of spoken language Dorethia Jeanmarie be erroneous, or at times, nonsensical words or phrases Alexiah Koroma be inadvertently transcribed.  Although I have reviewed the note for such errors, some Markelle Najarian still exist.

## 2022-08-13 NOTE — Telephone Encounter (Signed)
Message routed to Dr. Janae Bridgeman

## 2022-08-13 NOTE — Telephone Encounter (Signed)
-----   Message from Alver Sorrow Tepase sent at 08/13/2022  3:30 PM EDT -----  Regarding: ECC Message to Provider  ECC Message to Provider    Relationship to Patient: Self     Additional Information : The  patient already got her blood work result but upon checking she want to know or clarification where's the sodium in her test result.  --------------------------------------------------------------------------------------------------------------------------    Call Back Information: OK to leave message on voicemail  Preferred Call Back Number:  [phone]

## 2022-08-15 NOTE — Telephone Encounter (Signed)
-----   Message from Camila Jane Tejeros Tepase sent at 08/13/2022  3:30 PM EDT -----  Regarding: ECC Message to Provider  ECC Message to Provider    Relationship to Patient: Self     Additional Information : The  patient already got her blood work result but upon checking she want to know or clarification where's the sodium in her test result.  --------------------------------------------------------------------------------------------------------------------------    Call Back Information: OK to leave message on voicemail  Preferred Call Back Number:  [phone]

## 2022-08-15 NOTE — Telephone Encounter (Signed)
Message sent thru MyChart regarding her sodium.

## 2022-10-09 ENCOUNTER — Ambulatory Visit: Admit: 2022-10-09 | Discharge: 2022-10-09 | Payer: BLUE CROSS/BLUE SHIELD

## 2022-10-09 DIAGNOSIS — Z01419 Encounter for gynecological examination (general) (routine) without abnormal findings: Secondary | ICD-10-CM

## 2022-10-09 DIAGNOSIS — Z124 Encounter for screening for malignant neoplasm of cervix: Secondary | ICD-10-CM

## 2022-10-09 NOTE — Progress Notes (Signed)
PROGRESS NOTE   [facility] Medicine Residency Practice                                  [address], [address], [place] [address]         Phone: [phone]    Date of Service:  10/09/2022     Patient ID: .Susan [name] is a 57 y.o. female      Subjective:     CC:   Chief Complaint   Patient presents with    Gynecologic Exam       HPI  Patient is a 57 year old female is presenting today for well woman exam including Pap smear.  Initially the patient did also want to discuss concerns about new onset anal itching.  Patient states that she has been having itching that is been going on for the last couple days, she is concerned about possible external versus internal hemorrhoids.  She denies any bowel changes or any concerns for blood in stool.    ROS:    Review of Systems   Constitutional:  Negative for chills, fatigue and fever.   HENT:  Negative for congestion and tinnitus.    Eyes:  Negative for visual disturbance.   Respiratory:  Negative for cough, shortness of breath and wheezing.    Cardiovascular:  Negative for chest pain, palpitations and leg swelling.   Gastrointestinal:  Positive for rectal pain (iching). Negative for anal bleeding, blood in stool, constipation, diarrhea, nausea and vomiting.   Genitourinary:  Negative for difficulty urinating, dysuria, flank pain and hematuria.   Neurological:  Negative for dizziness, weakness, light-headedness, numbness and headaches.       Vitals:    10/09/22 1124   BP: 124/72   Site: Left Upper Arm   Position: Sitting   Cuff Size: Medium Adult   Pulse: 91   Temp: 99.2 F (37.3 C)   TempSrc: Oral   SpO2: 99%   Weight: 61.2 kg (135 lb)   Height: 1.588 m (5' 2.52")       Allergies:  Food, Other, Prednazoline, Cipro xr, and Sulfa antibiotics    No outpatient medications have been marked as taking for the 10/09/22 encounter (Office Visit) with Clarnce Flock, DO.         Objective:   Physical Exam  Exam conducted with  a chaperone present.   Constitutional:       Appearance: Normal appearance.   HENT:      Head: Normocephalic.   Eyes:      Conjunctiva/sclera: Conjunctivae normal.   Cardiovascular:      Rate and Rhythm: Normal rate and regular rhythm.      Pulses: Normal pulses.      Heart sounds: Normal heart sounds.   Pulmonary:      Effort: Pulmonary effort is normal.      Breath sounds: Normal breath sounds.   Abdominal:      General: Abdomen is flat. Bowel sounds are normal.      Palpations: Abdomen is soft.   Genitourinary:     Pubic Area: No rash or pubic lice.       Labia:         Right: No rash.         Left: No rash.       Vagina: Normal.      Cervix: Normal.  Uterus: Normal.       Adnexa: Right adnexa normal and left adnexa normal.      Rectum: Normal.      Comments: Mild pruritus on R peri rectal area, likely secondary to itching  Small cyst likely folliculitis/pseudofolliculitis  Skin:     General: Skin is warm.   Neurological:      General: No focal deficit present.      Mental Status: She is alert and oriented to person, place, and time.         Assessment / Plan:     1. Well woman exam with routine gynecological exam  Comments:  Annual Pap with HPV cotesting completed today  Will call patient with results  Also advised on completing mammogram  Recommend follow-up in 2-5 years  2. Anal itching  Comments:  DDx: Internal/external hemorrhoid, pruritus, fungal infection, other  Exam nonconcerning for infection or other acute process  Advised topical steroid cream use  3. Abnormal CT lung screening  Comments:  Noted in past  Due for repeat low-dose CT scan  Ordered as below  Orders:  -     CT Lung Screen (Initial or Annual); Future        EMR Dragon/transcription disclaimer:  Much of this encounter note is electronic transcription/translation of spoken language to printed texts.  The electronic translation of spoken language may be erroneous, or at times, nonsensical words or phrases may be inadvertently transcribed.   Although I have reviewed the note for such errors, some may still exist.     Clarnce Flock, DO

## 2022-10-14 LAB — HUMAN PAPILLOMAVIRUS (HPV) DNA PROBE THIN PREP HIGH RISK
HPV Genotype 16: NOT DETECTED
HPV Type 18: NOT DETECTED
HPVOH (Other Types): NOT DETECTED

## 2022-10-17 ENCOUNTER — Inpatient Hospital Stay: Admit: 2022-10-17 | Payer: BLUE CROSS/BLUE SHIELD

## 2022-10-17 DIAGNOSIS — R918 Other nonspecific abnormal finding of lung field: Secondary | ICD-10-CM

## 2022-11-28 ENCOUNTER — Encounter

## 2022-11-28 MED ORDER — METOPROLOL SUCCINATE ER 25 MG PO TB24
25 | ORAL_TABLET | ORAL | 3 refills | Status: DC
Start: 2022-11-28 — End: 2023-01-09

## 2022-11-28 NOTE — Telephone Encounter (Signed)
 Refill Request       Last Seen: Last Seen Department: 10/09/2022  Last Seen by PCP: 10/09/2022    Last Written: 07/22/22 45 3 refills     Next Appointment:   Future Appointments   Date Time Provider Department Center   01/09/2023  3:40 PM Mardeen Estrin, DO [facility] DE[facility]             Requested Prescriptions     Pending Prescriptions Disp Refills    metoprolol  succinate (TOPROL  XL) 25 MG extended release tablet 45 tablet 3     Sig: Take 1-1/2 (1.5) tablets a day.

## 2022-12-09 NOTE — Telephone Encounter (Signed)
-----   Message from [place] A sent at 12/08/2022  3:58 PM EDT -----  Regarding: ECC Referral Request  ECC Referral Request    Reason for referral request: Lab/Test Order    Specialist/Lab/Test patient is requesting (if known):    Specialist Phone Number (if applicable):    Additional Information : patient called to order lab work before her appointment on January 09, 2023..  --------------------------------------------------------------------------------------------------------------------------    Relationship to Patient: Self     Call Back Information: OK to leave message on voicemail  Preferred Call Back Number: Phone [phone] (home)

## 2023-01-09 ENCOUNTER — Inpatient Hospital Stay: Payer: PRIVATE HEALTH INSURANCE

## 2023-01-09 ENCOUNTER — Ambulatory Visit: Admit: 2023-01-09 | Discharge: 2023-01-09 | Payer: BLUE CROSS/BLUE SHIELD

## 2023-01-09 DIAGNOSIS — E222 Syndrome of inappropriate secretion of antidiuretic hormone: Secondary | ICD-10-CM

## 2023-01-09 DIAGNOSIS — Z0000 Encounter for general adult medical examination without abnormal findings: Secondary | ICD-10-CM

## 2023-01-09 LAB — COMPREHENSIVE METABOLIC PANEL
ALT: 10 U/L (ref 10–40)
AST: 23 U/L (ref 15–37)
Albumin/Globulin Ratio: 1.6 (ref 1.1–2.2)
Albumin: 4.4 g/dL (ref 3.4–5.0)
Alkaline Phosphatase: 54 U/L (ref 40–129)
Anion Gap: 10 (ref 3–16)
BUN: 10 mg/dL (ref 7–20)
CO2: 26 mmol/L (ref 21–32)
Calcium: 10.1 mg/dL (ref 8.3–10.6)
Chloride: 97 mmol/L — ABNORMAL LOW (ref 99–110)
Creatinine: 0.7 mg/dL (ref 0.6–1.1)
Est, Glom Filt Rate: >90 (ref >60)
Glucose: 88 mg/dL (ref 70–99)
Potassium: 4.5 mmol/L (ref 3.5–5.1)
Sodium: 133 mmol/L — ABNORMAL LOW (ref 136–145)
Total Bilirubin: 0.3 mg/dL (ref 0.0–1.0)
Total Protein: 7.2 g/dL (ref 6.4–8.2)

## 2023-01-09 LAB — LIPID PANEL
Cholesterol, Total: 139 mg/dL (ref 0–199)
HDL: 42 mg/dL (ref 40–60)
LDL Cholesterol: 73 mg/dL (ref <100)
Triglycerides: 122 mg/dL (ref 0–150)
VLDL Cholesterol Calculated: 24 mg/dL

## 2023-01-09 MED ORDER — LEVOCETIRIZINE DIHYDROCHLORIDE 5 MG PO TABS
5 | ORAL_TABLET | Freq: Every evening | ORAL | 3 refills | 90.00000 days | Status: DC
Start: 2023-01-09 — End: 2024-03-04

## 2023-01-09 MED ORDER — PANTOPRAZOLE SODIUM 40 MG PO TBEC
40 | ORAL_TABLET | Freq: Every day | ORAL | 3 refills | Status: DC
Start: 2023-01-09 — End: 2024-03-04

## 2023-01-09 MED ORDER — MONTELUKAST SODIUM 10 MG PO TABS
10 | ORAL_TABLET | Freq: Every evening | ORAL | 3 refills | 90.00000 days | Status: DC
Start: 2023-01-09 — End: 2024-03-04

## 2023-01-09 MED ORDER — SODIUM CHLORIDE 1 G PO TABS
1 | ORAL_TABLET | Freq: Two times a day (BID) | ORAL | 3 refills | 30.00000 days | Status: DC
Start: 2023-01-09 — End: 2024-01-27

## 2023-01-09 MED ORDER — ROSUVASTATIN CALCIUM 20 MG PO TABS
20 | ORAL_TABLET | Freq: Every evening | ORAL | 3 refills | Status: DC
Start: 2023-01-09 — End: 2024-03-04

## 2023-01-09 MED ORDER — MOMETASONE FUROATE 50 MCG/ACT NA SUSP
50 | Freq: Every day | NASAL | 3 refills | 30.00000 days | Status: DC
Start: 2023-01-09 — End: 2024-03-21

## 2023-01-09 MED ORDER — EPINEPHRINE 0.3 MG/0.3ML IJ SOAJ
0.3 | INTRAMUSCULAR | 0 refills | Status: AC
Start: 2023-01-09 — End: ?

## 2023-01-09 MED ORDER — AZELASTINE HCL 0.15 % NA SOLN
0.15 % | Freq: Every day | NASAL | 3 refills | Status: DC
Start: 2023-01-09 — End: 2023-06-19

## 2023-01-09 MED ORDER — METOPROLOL SUCCINATE ER 25 MG PO TB24
25 | ORAL_TABLET | Freq: Every day | ORAL | 3 refills | Status: DC
Start: 2023-01-09 — End: 2023-12-08

## 2023-01-09 MED ORDER — OLMESARTAN MEDOXOMIL 40 MG PO TABS
40 | ORAL_TABLET | Freq: Every day | ORAL | 3 refills | Status: DC
Start: 2023-01-09 — End: 2024-03-04

## 2023-01-09 NOTE — Assessment & Plan Note (Signed)
Borderline controlled, continue current medications   Advised to temporarily hold on some antihistamines for the next few days due to significant dust collection in house secondary to renovations, can restart after renovations are over  - Patient has seen ENT in the past and a surgical procedure was recommended patient defered. She does not wish to establish with a new ENT

## 2023-01-09 NOTE — Progress Notes (Cosign Needed)
PROGRESS NOTE   [facility] and [facility]                                  [address], [address], [place] [address]         Phone: [phone]    Date of Service:  01/09/2023     Patient ID: .Susan [name] is a 57 y.o. female      Subjective:     CC: 3 Month Follow-Up (Refills/)      HPI  Patient is a 57 y.o. year old female that has Seasonal allergies; Mixed hyperlipidemia; Essential hypertension; Chronic hyponatremia; Family history of elevated lipoprotein (a); ETD (eustachian tube dysfunction); Gastroesophageal reflux disease; Dry eyes; Prediabetes; Weight gain; Tobacco use; and SIADH (syndrome of inappropriate ADH production) (HCC) on their problem list..  Patient is presenting for above mentioned concerns.    Recent renovations in the house  Has been very dusty in the house  Having dryness in the nose and head  Also experiencing a cough without any fevers chills nausea or vomiting  No significant additional concerns noted at this time    Doing well on all other medications and taking appropriately    ROS:  A comprehensive review of systems was negative except for what was noted in the HPI.    Vitals:    01/09/23 1350   BP: 118/72   Site: Left Upper Arm   Position: Sitting   Cuff Size: Small Adult   Pulse: 88   Resp: 17   SpO2: 97%   Weight: 62.9 kg (138 lb 9.6 oz)   Height: 1.588 m (5' 2.52")       Allergies:  Food, Other, Prednazoline, Cipro xr, and Sulfa antibiotics    Outpatient Medications Marked as Taking for the 01/09/23 encounter (Office Visit) with Clarnce Flock, DO   Medication Sig Dispense Refill    azelastine HCl 0.15 % SOLN 1 spray by Each Nostril route daily USE 2 SPRAY(S) TWICE DAILY INTRANASALLY AS DIRECTED 23 mL 3    EPINEPHrine (EPIPEN) 0.3 MG/0.3ML SOAJ injection USE 1 PEN INTRAMUSCULARLY AR NEEDED ONLY FOR SEVERE REACTION FOR 1 DAY 2 each 0    levocetirizine (XYZAL) 5 MG tablet Take 1 tablet by mouth nightly 90 tablet 3     metoprolol succinate (TOPROL XL) 25 MG extended release tablet Take 1.5 tablets by mouth daily Take 1-1/2 (1.5) tablets a day. 135 tablet 3    mometasone (NASONEX) 50 MCG/ACT nasal spray 2 sprays by Nasal route daily 3 each 3    montelukast (SINGULAIR) 10 MG tablet Take 1 tablet by mouth nightly 90 tablet 3    olmesartan (BENICAR) 40 MG tablet Take 1 tablet by mouth daily 90 tablet 3    pantoprazole (PROTONIX) 40 MG tablet Take 1 tablet by mouth daily 90 tablet 3    rosuvastatin (CRESTOR) 20 MG tablet Take 1 tablet by mouth nightly 90 tablet 3    sodium chloride 1 g tablet Take 1 tablet by mouth 2 times daily (with meals) 180 tablet 3    aspirin 81 MG EC tablet Take 1 tablet by mouth daily           Objective:   Physical Exam  Constitutional:       Appearance: Normal appearance.   HENT:      Head: Normocephalic.   Eyes:  Conjunctiva/sclera: Conjunctivae normal.   Cardiovascular:      Rate and Rhythm: Normal rate and regular rhythm.      Pulses: Normal pulses.      Heart sounds: Normal heart sounds.   Pulmonary:      Effort: Pulmonary effort is normal.      Breath sounds: Normal breath sounds.   Abdominal:      General: Abdomen is flat. Bowel sounds are normal.      Palpations: Abdomen is soft.   Skin:     General: Skin is warm.   Neurological:      General: No focal deficit present.      Mental Status: She is alert and oriented to person, place, and time.         Assessment / Plan:     1. SIADH (syndrome of inappropriate ADH production) (HCC)  Assessment & Plan:   Chronic, at goal (stable), continue current treatment plan and medication adherence emphasized  Currently managed by nephrology  Continue sodium chloride tablets  Orders:  -     Comprehensive Metabolic Panel; Future  -     sodium chloride 1 g tablet; Take 1 tablet by mouth 2 times daily (with meals), Disp-180 tablet, R-3Normal  2. Seasonal allergies  Assessment & Plan:   Borderline controlled, continue current medications   Advised to temporarily  hold on some antihistamines for the next few days due to significant dust collection in house secondary to renovations, can restart after renovations are over  - Patient has seen ENT in the past and a surgical procedure was recommended patient defered. She does not wish to establish with a new ENT  Orders:  -     azelastine HCl 0.15 % SOLN; 1 spray by Each Nostril route daily USE 2 SPRAY(S) TWICE DAILY INTRANASALLY AS DIRECTED, Disp-23 mL, R-3Normal  -     levocetirizine (XYZAL) 5 MG tablet; Take 1 tablet by mouth nightly, Disp-90 tablet, R-3Normal  -     mometasone (NASONEX) 50 MCG/ACT nasal spray; 2 sprays by Nasal route daily, Nasal, DAILY Starting Fri 01/09/2023, Until Sat 01/09/2024, For 365 days, Disp-3 each, R-3, Normal  -     montelukast (SINGULAIR) 10 MG tablet; Take 1 tablet by mouth nightly, Disp-90 tablet, R-3Normal  3. Gastroesophageal reflux disease, unspecified whether esophagitis present  Assessment & Plan:   Chronic, at goal (stable), continue current treatment plan   Likely exacerbated by hiatal hernia  Doing well on Protonix  Consider surgery referral in future  Orders:  -     pantoprazole (PROTONIX) 40 MG tablet; Take 1 tablet by mouth daily, Disp-90 tablet, R-3Normal  4. Essential hypertension  Assessment & Plan:   Well-controlled, continue current medications   Continue metoprolol, olmesartan  - f/u every 6 mo  Orders:  -     metoprolol succinate (TOPROL XL) 25 MG extended release tablet; Take 1.5 tablets by mouth daily Take 1-1/2 (1.5) tablets a day., Disp-135 tablet, R-3Normal  -     olmesartan (BENICAR) 40 MG tablet; Take 1 tablet by mouth daily, Disp-90 tablet, R-3Normal  5. Mixed hyperlipidemia  Assessment & Plan:   Well-controlled, continue current medications   - annual lipid panel  Orders:  -     rosuvastatin (CRESTOR) 20 MG tablet; Take 1 tablet by mouth nightly, Disp-90 tablet, R-3Normal  -     LIPID PANEL; Future  6. Health care maintenance  -     Hemoglobin A1C; Future  -  Influenza, FLUCELVAX Trivalent, (age 23 mo+) IM, Preservative Free, 0.33mL      No follow-ups on file.    EMR Dragon/transcription disclaimer:  Much of this encounter note is electronic transcription/translation of spoken language to printed texts.  The electronic translation of spoken language may be erroneous, or at times, nonsensical words or phrases may be inadvertently transcribed.  Although I have reviewed the note for such errors, some may still exist.     Clarnce Flock, DO  PGY - 3 Resident Physician

## 2023-01-09 NOTE — Assessment & Plan Note (Signed)
Well-controlled, continue current medications   Continue metoprolol, olmesartan  - f/u every 6 mo

## 2023-01-09 NOTE — Assessment & Plan Note (Signed)
Chronic, at goal (stable), continue current treatment plan and medication adherence emphasized  Currently managed by nephrology  Continue sodium chloride tablets

## 2023-01-09 NOTE — Assessment & Plan Note (Signed)
Chronic, at goal (stable), continue current treatment plan   Likely exacerbated by hiatal hernia  Doing well on Protonix  Consider surgery referral in future

## 2023-01-09 NOTE — Assessment & Plan Note (Signed)
Well-controlled, continue current medications   - annual lipid panel

## 2023-01-10 LAB — HEMOGLOBIN A1C
Estimated Avg Glucose: 131.2 mg/dL
Hemoglobin A1C: 6.2 %

## 2023-06-19 ENCOUNTER — Inpatient Hospital Stay: Payer: PRIVATE HEALTH INSURANCE

## 2023-06-19 ENCOUNTER — Ambulatory Visit: Admit: 2023-06-19 | Discharge: 2023-06-19 | Payer: PRIVATE HEALTH INSURANCE

## 2023-06-19 VITALS — BP 134/74 | HR 80 | Temp 98.00000°F | Resp 17 | Ht 62.52 in | Wt 137.8 lb

## 2023-06-19 DIAGNOSIS — E222 Syndrome of inappropriate secretion of antidiuretic hormone: Secondary | ICD-10-CM

## 2023-06-19 DIAGNOSIS — J302 Other seasonal allergic rhinitis: Secondary | ICD-10-CM

## 2023-06-19 MED ORDER — VISTA MEIBO TEARS 0.6 % OP SOLN
0.6 | Freq: Every day | OPHTHALMIC | 1 refills | Status: DC
Start: 2023-06-19 — End: 2023-06-22

## 2023-06-19 NOTE — Assessment & Plan Note (Addendum)
 Monitored by specialist- no acute findings meriting change in the plan  Rechecking BMP to assess for Na deficiency  Orders:    Basic Metabolic Panel; Future

## 2023-06-19 NOTE — Assessment & Plan Note (Addendum)
 Uncontrolled  - Recommend setting quit date  - If patient does not tolerate quitting cold [place] recommend gradually decreasing tobacco use  - Hesitant to use Chantix (varenicline) as patient has a hx of night terrors  - Low Dose CT ordered    Orders:    PR VISIT TO DISCUSS LUNG CA SCREEN W LDCT    CT Lung Screen (Initial/Annual/Baseline); Future

## 2023-06-19 NOTE — Patient Instructions (Addendum)
 Complete Low Dose CT After 7/26     Learning About Lung Cancer Screening  What is screening for lung cancer?     Lung cancer screening is a way to find some lung cancers early, before a person has any symptoms of the cancer.  Lung cancer screening may help those who have the highest risk for lung cancer--people age 57 and older who are or were heavy smokers. For most people, who aren't at increased risk, screening for lung cancer probably isn't helpful.  Screening won't prevent cancer. And it may not find all lung cancers. Lung cancer screening may lower the risk of dying from lung cancer in a small number of people.  How is it done?  Lung cancer screening is done with a low-dose CT (computed tomography) scan. A CT scan uses X-rays, or radiation, to make detailed pictures of your body. Experts recommend that screening be done in medical centers that focus on finding and treating lung cancer.  Who is screening recommended for?  Lung cancer screening is recommended for people age 52 and older who are or were heavy smokers. That means people with a smoking history of at least 20 pack years. A pack year is a way to measure how heavy a smoker you are or were.  To figure out your pack years, multiply how many packs a day on average (assuming 20 cigarettes per pack) you have smoked by how many years you have smoked. For example:  If you smoked 1 pack a day for 20 years, that's 1 times 20. So you have a smoking history of 20 pack years.  If you smoked 2 packs a day for 10 years, that's 2 times 10. So you have a smoking history of 20 pack years.  Experts agree that screening is for people who have a high risk of lung cancer. But experts don't agree on what high risk means. Some say people age 51 or older with at least a 20-pack-year smoking history are high risk. Others say it's people age 106 or older with a 30-pack-year history.  To see if you could benefit from screening, first find out if you are at high risk for lung  cancer. Your doctor can help you decide your lung cancer risk.  What are the risks of screening?  CT screening for lung cancer isn't perfect. It can show an abnormal result when it turns out there wasn't any cancer. This is called a false-positive result. This means you may need more tests to make sure you don't have cancer. These tests can be harmful and cause a lot of worry.  These tests may include more CT scans and invasive testing like a lung biopsy. In a biopsy, the doctor takes a sample of tissue from inside your lung so it can be looked at under a microscope. A biopsy is the only way to tell if you have lung cancer. If the biopsy finds cancer, you and your doctor will have to decide how or whether to treat it.  Some lung cancers found on CT scans are harmless and would not have caused a problem if they had not been found through screening. But because doctors can't tell which ones will turn out to be harmless, most will be treated. This means that you may get treatment--including surgery, radiation, or chemotherapy--that you don't need.  There is a risk of damage to cells or tissue from being exposed to radiation, including the small amounts used in CTs, X-rays, and other  medical tests. Over time, exposure to radiation may cause cancer and other health problems. But in most cases, the risk of getting cancer from being exposed to small amounts of radiation is low. It's not a reason to avoid these tests for most people.  What are the benefits of screening?  Your scan may be normal (negative).  For some people who are at higher risk, screening lowers the chance of dying of lung cancer. How much and how long you smoked helps to determine your risk level. Screening can find some cancers early, when treatment may be more likely to work.  What happens after screening?  The results of your CT scan will be sent to your doctor. Someone from your care team will explain the results of your scan and answer any questions  you may have. If you need any follow-up, he or she will help you understand what to do next.  After a lung cancer screening, you can go back to your usual activities right away.  A lung cancer screening test can't tell if you have lung cancer. If your results are positive, your doctor can't tell whether an abnormal finding is a harmless nodule, cancer, or something else without doing more tests.  What can you do to help prevent lung cancer?  Some lung cancers can't be prevented. But if you smoke, quitting smoking is the best step you can take to prevent lung cancer. If you want to quit, your doctor can recommend medicines or other ways to help.  Follow-up care is a key part of your treatment and safety. Be sure to make and go to all appointments, and call your doctor if you are having problems. It's also a good idea to know your test results and keep a list of the medicines you take.  Where can you learn more?  Go to [employer] and enter Q940 to learn more about "Learning About Lung Cancer Screening."  Current as of: January 16, 2023  Content Version: 14.4   2024-2025 [place], [place].   Care instructions adapted under license by [facility]. If you have questions about a medical condition or this instruction, always ask your healthcare professional. [place], [facility], disclaims any warranty or liability for your use of this information.

## 2023-06-19 NOTE — Assessment & Plan Note (Addendum)
 Borderline controlled, continue current medications   Advised to temporarily hold on some antihistamines for the next few days due to significant dust collection in house secondary to renovations, can restart after renovations are over  - Patient has seen ENT in the past and a surgical procedure was recommended patient defered. She is open to establish with a new ENT, referral sent    Orders:    [place] - De Hollingshead, DO, Otolaryngology, [place]    Propylene Glycol (VISTA MEIBO TEARS) 0.6 % SOLN; Apply 1 drop to eye daily

## 2023-06-19 NOTE — Progress Notes (Signed)
 Susan [name] (DOB:  25-Dec-1965) is a 58 y.o. female,Established patient, here for evaluation of the following chief complaint(s):  Headache (Leg cramps  dehydration off and on  /)      Assessment & Plan  Seasonal allergies   Borderline controlled, continue current medications   Advised to temporarily hold on some antihistamines for the next few days due to significant dust collection in house secondary to renovations, can restart after renovations are over  - Patient has seen ENT in the past and a surgical procedure was recommended patient defered. She is open to establish with a new ENT, referral sent    Orders:    [place] - Hyacinth Meeker, Vernona Rieger, DO, Otolaryngology, [place]    Propylene Glycol (VISTA MEIBO TEARS) 0.6 % SOLN; Apply 1 drop to eye daily    SIADH (syndrome of inappropriate ADH production)   Monitored by specialist- no acute findings meriting change in the plan  Rechecking BMP to assess for Na deficiency  Orders:    Basic Metabolic Panel; Future    Tobacco use   Uncontrolled  - Recommend setting quit date  - If patient does not tolerate quitting cold [place] recommend gradually decreasing tobacco use  - Hesitant to use Chantix (varenicline) as patient has a hx of night terrors  - Low Dose CT ordered    Orders:    PR VISIT TO DISCUSS LUNG CA SCREEN W LDCT    CT Lung Screen (Initial/Annual/Baseline); Future      No follow-ups on file.    Subjective       Headaches, legs cramps, and dehydration  States that she has been having a lot of construction going on at home  Tried humidifier  Tried to cut off of the antihistamines xyzal, had severe withdrawal symptoms in the form of hives as well as significant congestion, currently down to 1 xyzal daily  Previously saw an allergy specialist in [place]      Review of Systems   All other systems reviewed and are negative.              Objective     BP 134/74 (BP Site: Left Upper Arm, Patient Position: Sitting, BP Cuff Size: Small Adult)   Pulse 80   Temp  98 F (36.7 C) (Oral)   Resp 17   Ht 1.588 m (5' 2.52")   Wt 62.5 kg (137 lb 12.8 oz)   LMP 07/19/2016   SpO2 98%   BMI 24.79 kg/m      Physical Exam  Constitutional:       Appearance: Normal appearance.   HENT:      Head: Normocephalic.   Eyes:      Conjunctiva/sclera: Conjunctivae normal.   Cardiovascular:      Rate and Rhythm: Normal rate and regular rhythm.      Pulses: Normal pulses.      Heart sounds: Normal heart sounds.   Pulmonary:      Effort: Pulmonary effort is normal.      Breath sounds: Normal breath sounds.   Abdominal:      General: Abdomen is flat. Bowel sounds are normal.      Palpations: Abdomen is soft.   Skin:     General: Skin is warm.   Neurological:      General: No focal deficit present.      Mental Status: She is alert and oriented to person, place, and time.  An electronic signature was used to authenticate this note.    --Clarnce Flock, DO Discussed with the patient the current USPSTF guidelines released May 31, 2019 for screening for lung cancer.    For adults aged 68 to 50 years who have a 20 pack-year smoking history and currently smoke or have quit within the past 15 years the grade B recommendation is to:  Screen for lung cancer with low-dose computed tomography (LDCT) every year.  Stop screening once a person has not smoked for 15 years or has a health problem that limits life expectancy or the ability to have lung surgery.    The patient  reports that she has been smoking cigarettes. She started smoking about 39 years ago. She has a 39.2 pack-year smoking history. She has never used smokeless tobacco.. Discussed with patient the risks and benefits of screening, including over-diagnosis, false positive rate, and total radiation exposure.  The patient currently exhibits no signs or symptoms suggestive of lung cancer.  Discussed with patient the importance of compliance with yearly annual lung cancer screenings and willingness to undergo diagnosis and  treatment if screening scan is positive.  In addition, the patient was counseled regarding the importance of remaining smoke free and/or total smoking cessation.    Also reviewed the following if the patient has Medicare that as of May 03, 2020, Medicare only covers LDCT screening in patients aged 50-77 with at least a 20 pack-year smoking history who currently smoke or have quit in the last 15 years

## 2023-06-20 LAB — BASIC METABOLIC PANEL
Anion Gap: 10 (ref 3–16)
BUN: 12 mg/dL (ref 7–20)
CO2: 24 mmol/L (ref 21–32)
Calcium: 9.8 mg/dL (ref 8.3–10.6)
Chloride: 98 mmol/L — ABNORMAL LOW (ref 99–110)
Creatinine: 0.8 mg/dL (ref 0.6–1.1)
Est, Glom Filt Rate: 86 (ref >60)
Glucose: 122 mg/dL — ABNORMAL HIGH (ref 70–99)
Potassium: 4.5 mmol/L (ref 3.5–5.1)
Sodium: 132 mmol/L — ABNORMAL LOW (ref 136–145)

## 2023-06-22 ENCOUNTER — Encounter

## 2023-06-22 ENCOUNTER — Telehealth

## 2023-06-22 MED ORDER — VISTA MEIBO TEARS 0.6 % OP SOLN
0.6 | Freq: Every day | OPHTHALMIC | 1 refills | 12.50000 days | Status: DC
Start: 2023-06-22 — End: 2024-01-19

## 2023-06-22 MED ORDER — PERFLUOROHEXYLOCTANE 1.338 GM/ML OP SOLN
1.338 | Freq: Every day | OPHTHALMIC | 1 refills | 12.50000 days | Status: DC
Start: 2023-06-22 — End: 2024-01-19

## 2023-06-22 MED ORDER — PERFLUOROHEXYLOCTANE 1.338 GM/ML OP SOLN
1.338 | Freq: Every day | OPHTHALMIC | 1 refills | Status: DC
Start: 2023-06-22 — End: 2023-06-22

## 2023-06-22 NOTE — Telephone Encounter (Signed)
 Patient is calling she is leaving for [place] on Friday and is needing the Propylene Glycol (VISTA MEIBO TEARS) 0.6 % SOLN to be sent to [facility] [address] [place] - [address] - P [phone] - F [phone] [[id number]]. Patient states [facility] does not carry the medication.Marland Kitchen and she is worried about her blood work and would like to know what she needs to do asap.      Susan [name] [phone] (home)

## 2023-06-22 NOTE — Telephone Encounter (Signed)
 Rx sent to requested pharmacy, thank you.     Regarding blood work, her lab was stable. Can increase salt intake. Also try fluid restriction, between 1 to 1.5 liters of water daily.

## 2023-06-22 NOTE — Telephone Encounter (Signed)
Called left voice mail to call office back

## 2023-06-22 NOTE — Telephone Encounter (Signed)
 Patient was advised of dr's message.

## 2023-06-22 NOTE — Telephone Encounter (Signed)
 Refill Request     Last Seen: 06/19/2023    Last Written: Ordered On: 06/19/2023   Disp-4 each, R-1   Next Appointment:   No future appointments.          Requested Prescriptions      No prescriptions requested or ordered in this encounter

## 2023-06-22 NOTE — Progress Notes (Signed)
 Sent eye drops as pharmacy requested

## 2023-07-29 ENCOUNTER — Encounter: Payer: PRIVATE HEALTH INSURANCE | Attending: Otolaryngology

## 2023-08-24 ENCOUNTER — Ambulatory Visit: Admit: 2023-08-24 | Discharge: 2023-08-24 | Payer: PRIVATE HEALTH INSURANCE | Attending: Otolaryngology

## 2023-08-24 VITALS — BP 149/72 | HR 73 | Ht 62.5 in | Wt 135.0 lb

## 2023-08-24 DIAGNOSIS — J324 Chronic pansinusitis: Secondary | ICD-10-CM

## 2023-08-24 MED ORDER — AMOXICILLIN-POT CLAVULANATE 875-125 MG PO TABS
875-125 | ORAL_TABLET | Freq: Two times a day (BID) | ORAL | 0 refills | 7.00 days | Status: AC
Start: 2023-08-24 — End: 2023-09-03

## 2023-08-24 NOTE — Progress Notes (Signed)
 [facility]  DIVISION OF OTOLARYNGOLOGY- HEAD & NECK SURGERY  CONSULT    Patient Name: GLENNA BRUNKOW  Medical Record Number:  [id number]  Primary Care Physician:  Armen Bernhardt, DO  Date of Consultation: 08/24/2023    Chief Complaint:   Chief Complaint   Patient presents with    New Patient    Sinus Problem     Chronic Sinus dryness       HISTORY OF PRESENT ILLNESS  Kenady is a(n) 58 y.o. female who presents for evaluation of chronic sinus congestion and dryness.  She states that she takes Xyzal , Nasonex , and Singulair .  She tried Astelin  but it dried her nose out too much.  She is unable to do Nettie pot because it causes her ears to become full.  She has done allergy shots on 2 separate occasions and had an anaphylactic reaction to that.  She has never had any nose or sinus surgery.  She has not had any recent head and neck imaging.  Patient Active Problem List   Diagnosis    Seasonal allergies    Mixed hyperlipidemia    Essential hypertension    Chronic hyponatremia    Family history of elevated lipoprotein (a)    ETD (eustachian tube dysfunction)    Gastroesophageal reflux disease    Dry eyes    Prediabetes    Weight gain    Tobacco use    SIADH (syndrome of inappropriate ADH production)     Past Surgical History:   Procedure Laterality Date    COLONOSCOPY      2011    COLONOSCOPY N/A 02/19/2021    COLONOSCOPY POLYPECTOMY SNARE/COLD BIOPSY performed by Annett Bart, MD at [facility]COPY    ESOPHAGOGASTRODUODENOSCOPY       Family History   Problem Relation Age of Onset    Diabetes Mother         borderline    Kidney Disease Father     Dementia Father      Social History     Socioeconomic History    Marital status: Married     Spouse name: Not on file    Number of children: Not on file    Years of education: Not on file    Highest education level: Not on file   Occupational History    Not on file   Tobacco Use    Smoking status: Every Day     Current packs/day: 1.00     Average packs/day: 1 pack/day  for 39.4 years (39.4 ttl pk-yrs)     Types: Cigarettes     Start date: 03/24/1984    Smokeless tobacco: Never   Vaping Use    Vaping status: Never Used   Substance and Sexual Activity    Alcohol use: No    Drug use: Never    Sexual activity: Not on file   Other Topics Concern    Not on file   Social History Narrative    Not on file     Social Drivers of Health     Financial Resource Strain: Low Risk  (08/11/2022)    Overall Financial Resource Strain (CARDIA)     Difficulty of Paying Living Expenses: Not hard at all   Food Insecurity: No Food Insecurity (06/18/2023)    Hunger Vital Sign     Worried About Running Out of Food in the Last Year: Never true     Ran Out of Food in the Last Year: Never true  Transportation Needs: No Transportation Needs (06/18/2023)    PRAPARE - Therapist, art (Medical): No     Lack of Transportation (Non-Medical): No   Physical Activity: Not on file   Stress: Not on file   Social Connections: Not on file   Intimate Partner Violence: Unknown (04/11/2022)    Received from [employer] and [employer], [employer] and [employer]    Interpersonal Safety     Feel physically or emotionally unsafe where currently live: Not on file     Harm by anyone: Not on file     Emotionally Harmed: Not on file   Housing Stability: Low Risk  (06/18/2023)    Housing Stability Vital Sign     Unable to Pay for Housing in the Last Year: No     Number of Times Moved in the Last Year: 0     Homeless in the Last Year: No     DRUG/FOOD ALLERGIES: Food, Other/food, Prednazoline, Cipro xr, and Sulfa antibiotics  CURRENT MEDICATIONS  Prior to Admission medications    Medication Sig Start Date End Date Taking? Authorizing Provider   amoxicillin-clavulanate (AUGMENTIN) 875-125 MG per tablet Take 1 tablet by mouth 2 times daily for 10 days 08/24/23 09/03/23 Yes Jjesus Dingley, Jenine Mix, DO   Propylene Glycol (VISTA MEIBO TEARS) 0.6 % SOLN Apply 1 drop to eye daily 06/22/23  Yes Abi-Rached,  Johnny D, DO   Perfluorohexyloctane  1.338 GM/ML SOLN Apply 1 drop to eye daily 06/22/23  Yes Louana Roup, MD   EPINEPHrine  (EPIPEN ) 0.3 MG/0.3ML SOAJ injection USE 1 PEN INTRAMUSCULARLY AR NEEDED ONLY FOR SEVERE REACTION FOR 1 DAY 01/09/23  Yes Savaliya, Rishikesh, DO   levocetirizine (XYZAL ) 5 MG tablet Take 1 tablet by mouth nightly 01/09/23  Yes Savaliya, Rishikesh, DO   metoprolol  succinate (TOPROL  XL) 25 MG extended release tablet Take 1.5 tablets by mouth daily Take 1-1/2 (1.5) tablets a day. 01/09/23  Yes Savaliya, Rishikesh, DO   mometasone  (NASONEX ) 50 MCG/ACT nasal spray 2 sprays by Nasal route daily 01/09/23 01/09/24 Yes Savaliya, Rishikesh, DO   montelukast  (SINGULAIR ) 10 MG tablet Take 1 tablet by mouth nightly 01/09/23  Yes Savaliya, Rishikesh, DO   olmesartan  (BENICAR ) 40 MG tablet Take 1 tablet by mouth daily 01/09/23  Yes Savaliya, Rishikesh, DO   pantoprazole  (PROTONIX ) 40 MG tablet Take 1 tablet by mouth daily 01/09/23  Yes Savaliya, Rishikesh, DO   rosuvastatin  (CRESTOR ) 20 MG tablet Take 1 tablet by mouth nightly 01/09/23  Yes Savaliya, Rishikesh, DO   sodium chloride  1 g tablet Take 1 tablet by mouth 2 times daily (with meals) 01/09/23  Yes Savaliya, Rishikesh, DO   aspirin  81 MG EC tablet Take 1 tablet by mouth daily   Yes [provider]     REVIEW OF SYSTEMS  The following systems were reviewed and revealed the following in addition to any already discussed in the HPI:  Review of Systems   Constitutional:  Negative for fatigue and fever.   HENT:  Positive for congestion, postnasal drip, rhinorrhea, sinus pressure and sinus pain. Negative for ear pain and sneezing.    Eyes:  Negative for pain and visual disturbance.   Respiratory:  Negative for cough and shortness of breath.    Cardiovascular:  Negative for chest pain.   Gastrointestinal:  Negative for nausea and vomiting.   Endocrine: Negative.    Genitourinary: Negative.    Musculoskeletal:  Negative for neck pain and neck  stiffness.  Skin:  Negative for rash.   Neurological:  Negative for dizziness and headaches.      PHYSICAL EXAM  BP (!) 149/72   Pulse 73   Ht 1.588 m (5' 2.5")   Wt 61.2 kg (135 lb)   LMP 07/19/2016   BMI 24.30 kg/m   GENERAL: No Acute Distress, Alert and Oriented, no hoarseness  EYES: EOMI, Anti-icteric  NOSE: No epistaxis, nasal mucosa within normal limits, no purulent drainage  EARS: Normal external canal appearance, EAC patent bilaterally, TM's intact bilaterally with no evidence of effusions   FACE: 1/6 House-Brackmann Scale, symmetric, sensation equal bilaterally  ORAL CAVITY: No masses or lesions palpated, uvula is midline, moist mucous membranes,   NECK: Normal range of motion, no thyromegaly, trachea is midline, no lymphadenopathy, no neck masses, no crepitus  CHEST: Normal respiratory effort, no retractions, breathing comfortably  SKIN: No rashes, normal appearing skin, no evidence of skin lesions/tumors  RADIOLOGY  Summary of findings:    PROCEDURE  Nasal Endoscopy    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 After topical anesthesia and decongestion had been obtained using aerosolized 1% lidocaine  and oxymetazoline, a 45 degree rigid endoscope was placed into both nares with the patient in a sitting position. The following was observed:    Right Nasal Cavity and Paranasal Sinuses:  Polyp score = 0 (0 = no polyps, 1 = small polyps in middle meatus not reaching below the inferior border of the middle concha, 2 = polyps reaching below the middle border of the middle turbinate, 3= large polyps reaching the lower border of the inferior turbinate or polyps medial to the middle concha, 4= large polyps causing almost complete congestion/obstruction of the interior meatus)  Edema score = 1 (0 = absent, 1 = mild, 2 = severe)  Discharge score = 0 (0 = no discharge, 1 = clear thin discharge, 2 = thick purulent discharge)    Left Nasal Cavity and Paranasal Sinuses:    Polyp  score = 0 (0 = no polyps, 1 = small polyps in middle meatus not reaching below the inferior border of the middle concha, 2 = polyps reaching below the middle border of the middle turbinate, 3= large polyps reaching the lower border of the inferior turbinate or polyps medial to the middle concha, 4= large polyps causing almost complete congestion/obstruction of the interior meatus)  Edema score = 2 (0 = absent, 1 = mild, 2 = severe)  Discharge score = 2 (0 = no discharge, 1 = clear thin discharge, 2 = thick purulent discharge)    Septum: intact and deviated to the right  Other: The inferior and middle turbinates were examined.  The middle meatus, and sphenoethmoid recess was examined bilaterally.      There were no complications.     ASSESSMENT/PLAN  Albie is a very pleasant 58 y.o. female with     1. Chronic pansinusitis  Purulent drainage has been coming from the left cheek sinus on endoscopy.  She gets several infections per year requiring antibiotics and has never had a CT scan.  We will obtain a CT scan after she finishes her antibiotics.  - amoxicillin-clavulanate (AUGMENTIN) 875-125 MG per tablet; Take 1 tablet by mouth 2 times daily for 10 days  Dispense: 20 tablet; Refill: 0  - CT SINUS FOR IMAGE GUIDANCE; Future  - NASAL ENDOSCOPY,DX    2. Deviated nasal septum  She has contact of her septum to the inferior  turbinate on the right.  We will observe this for now    3. Seasonal allergies  We will have her continue taking her Nasonex , Xyzal , and Singulair .    Follow-up in 3 to 4 weeks after CT scan has been completed.  Herminio Lopes, DO  08/24/2023      Medical Decision Making:  The following items were considered in medical decision making:  Independent review of images  Review / order clinical lab tests  Review / order radiology tests  Decision to obtain old records

## 2023-09-07 ENCOUNTER — Ambulatory Visit: Admit: 2023-09-07 | Discharge: 2023-09-07 | Payer: PRIVATE HEALTH INSURANCE

## 2023-09-07 ENCOUNTER — Inpatient Hospital Stay: Admit: 2023-09-07 | Payer: PRIVATE HEALTH INSURANCE

## 2023-09-07 VITALS — BP 140/74 | HR 80 | Resp 17 | Ht 62.5 in | Wt 139.6 lb

## 2023-09-07 DIAGNOSIS — E222 Syndrome of inappropriate secretion of antidiuretic hormone: Secondary | ICD-10-CM

## 2023-09-07 DIAGNOSIS — M25531 Pain in right wrist: Secondary | ICD-10-CM

## 2023-09-07 NOTE — Assessment & Plan Note (Addendum)
 Ongoing monitoring by specialist, no acute findings meriting change in plan at this time, however due to increased sweating as well as lightheadedness, concerns of possible progression of hyponatremia, will recheck with BMP    Orders:    Basic Metabolic Panel; Future

## 2023-09-07 NOTE — Progress Notes (Signed)
 Susan [name] (DOB:  December 18, 1965) is a 58 y.o. female,Established patient, here for evaluation of the following chief complaint(s):  Follow-up (Patient states she is not sure if its sodium patient has not been sweating patient has been sweating a lot the last 2 weeks /Wrist pain x1 month patient had to catch someone and not sure what she did to it)      Assessment & Plan  SIADH (syndrome of inappropriate ADH production)  Ongoing monitoring by specialist, no acute findings meriting change in plan at this time, however due to increased sweating as well as lightheadedness, concerns of possible progression of hyponatremia, will recheck with BMP    Orders:    Basic Metabolic Panel; Future    Sweating abnormality  Concerns for progression of SIADH and hyponatremia versus possible hyperthyroidism, will further evaluate with labs as below    Orders:    Basic Metabolic Panel; Future    TSH reflex to FT4; Future    Right wrist pain  Concerns for de Quervain's tenosynovitis, wrist fracture, other  Will further evaluate with x-ray to rule out any bony pathology, advised patient on using thumb spica splint, advised follow-up with orthopedic hand surgery, referral given as below  Also advised on topical NSAID use    Orders:    XR WRIST RIGHT (MIN 3 VIEWS); Future    [facility] - Fredy Jericho, MD, Hand Surgery (Hand, Wrist, Upper Extremity), [place]      No follow-ups on file.    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   HENT:      Head: Normocephalic.   Eyes:      Conjunctiva/sclera: Conjunctivae normal.   Cardiovascular:      Rate and Rhythm: Normal rate and regular rhythm.      Pulses: Normal pulses.      Heart sounds: Normal heart sounds.   Pulmonary:      Effort: Pulmonary effort is normal.      Breath sounds: Normal breath sounds.   Abdominal:      General: Abdomen is flat. Bowel sounds are normal.      Palpations: Abdomen is soft.   Musculoskeletal:      Right wrist: Swelling, tenderness (Significant tenderness throughout wrist and multiple different regions including snuffbox tenderness, however worst tenderness is located along the thumb tendons), bony tenderness and snuff box tenderness present. No lacerations or crepitus. Decreased range of motion (In multiple planes of motion secondary  to pain). Normal pulse.   Skin:     General: Skin is warm.   Neurological:      General: No focal deficit present.      Mental Status: She is alert and oriented to person, place, and time.                      An electronic signature was used to authenticate this note.    --Armen Bernhardt, DO

## 2023-09-08 LAB — BASIC METABOLIC PANEL
Anion Gap: 9 (ref 3–16)
BUN: 10 mg/dL (ref 7–20)
CO2: 24 mmol/L (ref 21–32)
Calcium: 9.1 mg/dL (ref 8.3–10.6)
Chloride: 99 mmol/L (ref 99–110)
Creatinine: 0.7 mg/dL (ref 0.6–1.1)
Est, Glom Filt Rate: >90 (ref >60)
Glucose: 102 mg/dL — ABNORMAL HIGH (ref 70–99)
Potassium: 4.6 mmol/L (ref 3.5–5.1)
Sodium: 132 mmol/L — ABNORMAL LOW (ref 136–145)

## 2023-09-08 LAB — TSH REFLEX TO FT4: TSH Reflex FT4: 1.8 u[IU]/mL (ref 0.27–4.20)

## 2023-09-09 ENCOUNTER — Inpatient Hospital Stay: Admit: 2023-09-09 | Payer: PRIVATE HEALTH INSURANCE | Attending: Otolaryngology

## 2023-09-09 DIAGNOSIS — J324 Chronic pansinusitis: Secondary | ICD-10-CM

## 2023-09-10 NOTE — Telephone Encounter (Signed)
 Patient returned call and was given the result message from Dr. Merian. She voiced understanding.

## 2023-09-10 NOTE — Other (Signed)
 Please let Susan [name] know that the CT scan did show some evidence of sinus disease.  We can go over the results and discuss treatment options at her appointment on the 26.

## 2023-09-15 ENCOUNTER — Ambulatory Visit: Admit: 2023-09-15 | Payer: PRIVATE HEALTH INSURANCE

## 2023-09-15 ENCOUNTER — Ambulatory Visit: Admit: 2023-09-15 | Discharge: 2023-09-15 | Payer: PRIVATE HEALTH INSURANCE | Attending: Physician Assistant

## 2023-09-15 VITALS — Ht 62.52 in | Wt 139.0 lb

## 2023-09-15 DIAGNOSIS — R52 Pain, unspecified: Secondary | ICD-10-CM

## 2023-09-15 MED ORDER — MELOXICAM 15 MG PO TABS
15 | ORAL_TABLET | Freq: Every day | ORAL | 0 refills | Status: DC
Start: 2023-09-15 — End: 2023-10-16

## 2023-09-15 NOTE — Progress Notes (Signed)
 Chief Complaint    New Patient (N Right Wrist/)      History of Present Illness:  Susan [name] is a 58 y.o. female who presents to the office today for a new problem.  Patient here with a chief complaint of right wrist pain.  Her right wrist became painful approximately 4 weeks ago when she caught a resident.  Her pain is concentrated over the first dorsal compartment and over the distal radius and ulna.  She also has pain that extends distally into the elbow.  She was instructed to obtain a log to help with swelling which actually hurts her more.       Medical History:  Patient's medications, allergies, past medical, surgical, social and family histories were reviewed and updated as appropriate.    Review of Systems:  Pertinent items are noted in HPI  Review of systems reviewed from Patient History Form dated on 09/15/2023 and available in the patient's chart under the Media tab.     Vital Signs:  LMP 07/19/2016     General Exam:   Constitutional: Patient is adequately groomed with no evidence of malnutrition  DTRs: Deep tendon reflexes are intact  Mental Status: The patient is oriented to time, place and person.  The patient's mood and affect are appropriate.  Lymphatic: The lymphatic examination bilaterally reveals all areas to be without enlargement or induration.  Vascular: Examination reveals no swelling or calf tenderness.  Peripheral pulses are palpable and 2+.  Neurological: The patient has good coordination.  There is no weakness or sensory deficit.    Right wrist Examination:    Inspection: No obvious swelling or ecchymosis.    Palpation: Focal tenderness over the anatomical snuffbox and radial styloid.    Range of Motion: Range of motion limited by pain    Strength: 4/5 grip and pinch strength    Special Tests: Severe pain over the anatomical snuffbox    Skin: There are no rashes, ulcerations or lesions.    Gait: Normal    Reflex +2    Additional Comments:       Additional Examinations:         Left  Upper Extremity: Examination of the left upper extremity does not show any tenderness, deformity or injury.  Range of motion is unremarkable.  There is no gross instability.  There are no rashes, ulcerations or lesions.  Strength and tone are normal.    Radiology:     X-rays obtained and reviewed in office:  Views AP, lateral, 4 view and oblique views  Location right wrist  Impression no obvious fractures or dislocations.  Well-maintained joint space    X-ray were also ordered today and reviewed of the right elbow.  3 views.  AP, lateral, and radial head views.  They demonstrate no evidence of fractures or dislocations with well-maintained joint space.        Assessment : Patient has a right wrist sprain    Impression:  Encounter Diagnoses   Name Primary?    Pain Yes    Right wrist sprain, initial encounter        Office Procedures:  No orders of the defined types were placed in this encounter.      Treatment Plan: Patient's x-rays continue to be normal including a scaphoid view.  With her severe tenderness I have ordered her an MRI of the wrist.  I did consider the elbow for possible pain generator but x-rays were normal.  I will have her  follow-up with Dr. Dolores afterwards.  She cannot take steroids.  I will place her on Mobic.    I discussed with Susan [name] that her history, symptoms, signs, and imaging are most consistent with wrist sprain.    We reviewed the natural history of these conditions and treatment options ranging from conservative measures (rest, icing, activity modification, physical therapy, pain meds) to surgical options.     In terms of treatment, I recommended continuing with rest, icing, avoidance of painful activities, NSAIDs or pain meds as tolerated, and physical therapy.     We discussed surgical options as well, should conservative measures fail.

## 2023-09-16 NOTE — Telephone Encounter (Signed)
 MRI RIGHT WRIST no precert required

## 2023-09-17 ENCOUNTER — Ambulatory Visit: Admit: 2023-09-17 | Discharge: 2023-09-17 | Payer: PRIVATE HEALTH INSURANCE | Attending: Otolaryngology

## 2023-09-17 VITALS — BP 115/78 | HR 76 | Ht 62.5 in | Wt 139.0 lb

## 2023-09-17 DIAGNOSIS — J324 Chronic pansinusitis: Principal | ICD-10-CM

## 2023-09-17 NOTE — Patient Instructions (Signed)
SINUS SURGERY (FESS)  Post Operative Instructions    [facility] ENT Number (24 hours): [phone]    The Surgery Itself  Endoscopic sinus surgery (with or without septoplasty and turbinate reduction) involves general anesthesia, typically for one to two hours. Patients may be sedated for several hours after surgery and may remain sleepy for the better part of the day. Nausea and vomiting are occasionally seen, and usually resolve by the evening of surgery - even without additional medications. Almost all patients can go home the day of surgery.    After Surgery  Facial pressure and fullness similar to a sinus infection/headache is normal after surgery. Breathing through your nose is also difficult due to swelling. A humidifier or vaporizer can be used in the bedroom to prevent throat pain with mouth breathing.  Bloody nasal drainage is normal after this surgery for 5-7 days, usually decreasing in volume with each day that passes. Drainage will flow from the front of the nose and down the back of the throat. Make sure you spit out blood drainage that drips down the back of your throat to prevent nausea/vomiting. You will have a nasal drip pad/sling with gauze to catch drainage from the front of your nose. The dressing may need to be changed frequently during the first 24 hours following surgery. In case of profuse nasal bleeding, you may apply ice to the bridge of the nose and pinch the nose just above the tip and hold for 10 minutes; if bleeding continues, contact the doctors office.  Started the day after sinus surgery use sinus irrigations at least 3 times per day (full bottle).  The most common way is to use Neomed sinus rinses.  Fill the bottle with distilled water and mixed the salt bicarbonate packet.  You can be seen at this website: https://changlab.medicine.https://landry-harmon.info/   It is more comfortable to sleep with extra pillows or in a recliner for the first few days after surgery until the  drainage begins to resolve.   Do not blow your nose for 2 weeks after surgery.  Avoid lifting > 10 lbs. and no vigorous exercise for 2 weeks after surgery.  Avoid airplane travel for 2 weeks following sinus surgery; the cabin pressure changes can cause pain and swelling within the nose/sinuses.  Sense of smell and taste are often diminished for several weeks after surgery. There may be some tenderness or numbness in your upper front teeth, which is normal after surgery. You may express old clot, discolored mucus or very large nasal crusts from your nose for up to 3-4 weeks after surgery; depending on how frequently and how effectively you irrigate your nose with the saltwater spray.  You may have absorbable sutures inside of your nose after surgery that will slowly dissolve in 2-3 weeks. Be careful when clearing crusts from the nose since they may be attached to these sutures.    Medications  Pain medication can be used for pain as prescribed. Pain and pressure in the nose is expected after surgery. As the surgical site heals, pain will resolve over the course of a week. Pain medications can cause nausea, which can be prevented if you take them with food or milk.  You will be given an antibiotic for one week after surgery to treat any existing and prevent further infection. Take this medication with food to prevent nausea or vomiting.  You can use 2 nasal sprays after surgery: Afrin can be used up to 2 times a day for up to  5 days after surgery (best before bed) to reduce bloody drainage from the nose for the first few days after surgery.   Take all of your routine medications as prescribed, unless told otherwise by your surgeon. Any medications that thin the blood should be avoided. These include aspirin and aspirin-like products (Advil, Motrin, Excedrin, Alieve, Celebrex, Naprosyn).  SINUS IRRIGATIONS INSTRUCTIONS    Started the day after sinus surgery use sinus irrigations at least 3 times per day (full bottle).    Fill the bottle with distilled water and mixed the salt bicarbonate packet.  An instructional video can be seen at: SignSeekers.com.cy  The goal is to gently irrigate both nostrils so that any residual blood clots can be flushed from your nose.    If you start having ear pain after irrigations do not squeeze the bottle as firmly.
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
## 2023-09-28 ENCOUNTER — Encounter: Payer: PRIVATE HEALTH INSURANCE | Attending: Physician Assistant

## 2023-10-05 ENCOUNTER — Encounter: Payer: PRIVATE HEALTH INSURANCE | Attending: Physician Assistant

## 2023-10-07 ENCOUNTER — Encounter: Payer: PRIVATE HEALTH INSURANCE | Attending: Student in an Organized Health Care Education/Training Program

## 2023-10-07 ENCOUNTER — Ambulatory Visit
Admit: 2023-10-07 | Discharge: 2023-10-07 | Payer: PRIVATE HEALTH INSURANCE | Attending: Student in an Organized Health Care Education/Training Program

## 2023-10-07 DIAGNOSIS — M654 Radial styloid tenosynovitis [de Quervain]: Principal | ICD-10-CM
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
## 2023-10-08 NOTE — Telephone Encounter (Signed)
 Left VM For patient to call back regarding sx scheduling

## 2023-10-12 ENCOUNTER — Encounter

## 2023-10-12 NOTE — Telephone Encounter (Signed)
 Left VM again for surgery scheduling

## 2023-10-14 NOTE — Progress Notes (Signed)
 Susan [name]    Age 58 y.o.    female    February 03, 1966    MRN [id number]    10/26/2023  Arrival Time_____________  OR Time____________[name]     Procedure(s):  RIGHT DE QUERVAINS RELEASE                      Monitor Anesthesia Care    Surgeon(s):  Dolores Duwaine LABOR, MD       Phone [phone] (home)     Initals  Date  Info Source  Home  Cell         Work  _____________________________________________________________________  _____________________________________________________________________  _____________________________________________________________________  _____________________________________________________________________  _____________________________________________________________________    PCP _____________________________ Phone_________________     H&P  ________________  Bringing      Chart              Epic      DOS      Called________  EKG ________________   Bringing      Chart              Epic      DOS      Called________  LABS________________   Bringing     Chart              Epic      DOS      Called________  Cardiac Clearance ______ Bringing      Chart              Epic      DOS      Called________  Pulmonary Clearance____ Bringing      Chart              Epic      DOS      Called________    Cardiologist________________________ Phone___________________________  Pulmonologist_______________________Phone___________________________      ? Blood Refusal / Waiver on Chart            PAT Communications________________  ? Pre-op Instructions Given /Understood          _________________________________  ? Directions to Surgery Center                          _________________________________  ? Transportation Home_______________      __________________________________  ? Crutches/Walker__________________        __________________________________    Orders: Hard copy/ EPIC                 Transcribed/ EPIC                  ________Pharmacy                         ________WEIGHT    COVID + _______DATE     ___OK per Anesthesia   ____OK per Surgeon

## 2023-10-16 ENCOUNTER — Ambulatory Visit: Admit: 2023-10-16 | Discharge: 2023-10-16 | Payer: PRIVATE HEALTH INSURANCE

## 2023-10-16 VITALS — BP 132/76 | HR 66 | Resp 17 | Ht 62.5 in | Wt 138.6 lb

## 2023-10-16 DIAGNOSIS — Z01818 Encounter for other preprocedural examination: Principal | ICD-10-CM

## 2023-10-16 NOTE — Progress Notes (Signed)
 DATE AND TIME OF SURGERY: 10/26/2023 8:45 AM              ARRIVAL TIME:  6:45 AM    [facility] is located at [address] [place], [place]  It is the tan building to the right of the main hospital and the entrance is marked in blue letters [facility]     These are general instructions. Please follow any additional instructions provided to you by your surgeon's office.     Bring Picture ID and insurance card.  Please wear simple, loose fitting clothing to the surgery center.   Do not bring valuables (money, credit cards, checkbooks, etc.)   Do not wear any makeup.   No nail polish on fingers and/or toes. No artificial nails.   No metal hair clips and/or bobby pins. Elastic hair ties (without metal) and cloth scrunchies are OK.  Do not wear any jewelry or piercings on day of surgery.  All body piercings must be removed.  If you have dentures, they will be removed before going to the OR; we will provide a container, if needed.  If you wear contact lenses or glasses, they will be removed; please bring a case.  If you wear hearing aids, they will be removed; we will provide a container, if needed.   If you use oxygen and have a portable tank, please bring it with you. If you use a CPAP machine, please bring it with you.   Nothing to eat or drink after midnight the day before surgery. This includes ice chips, sips of water, hard candy, and chewing gum.   Do not smoke, vape or drink any alcoholic beverages 24 hours prior to surgery.  This includes non-alcoholic beer. Refrain from the usage of any recreational drugs.  Medication instructions:  Take the following medication(s) morning of surgery with a sip of water:  METOPROLOL , PANTOPRAZOLE   OK TO USE EYE DROPS AND NASAL SPRAY MORNING OF SURGERY, IF NEEDED.  DO NOT TAKE OLMESARTAN  10/25/2023. DO  NOT TAKE OLMESARTAN  MORNING OF SURGERY.   Aspirin , Ibuprofen, Advil, Naproxen , Vitamin E and other Anti-inflammatory products and  vitamins/supplements should be stopped for 5 -7days before surgery or as directed by your physician.  Shower the evening before or morning of surgery with antibacterial soap.  You may brush your teeth and gargle the morning of surgery.  DO NOT SWALLOW WATER.   Notify your Surgeon if you develop any illness between now and time of surgery. Cough, cold, fever, sore throat, nausea, vomiting, etc.  Please notify your surgeon if you experience dizziness, shortness of breath or blurred vision between now & the time of your surgery.   You must plan for a responsible adult to stay on site while you are here and take you home after your surgery. You will not be allowed to leave alone or drive yourself home. It is strongly suggested someone stay with you the first 24 hrs. Your surgery will be cancelled if you do not have a ride home.  If you have a Living Will and Durable Power of Attorney for Healthcare, please bring a copy.  If your surgery requires the use of crutches, sling, or surgical boot please bring with you day of surgery, if you have prior to surgery.  To provide excellent care, visitors will be limited to two per room at any given time. No visitors under the age of 77.  For your convenience, [facility] has a pharmacy on  site to fill your prescriptions.         If you have any questions or concerns, please feel free to call the pre-admission testing department at [phone]. Monday-Friday 8:00 AM-4:30 PM

## 2023-10-16 NOTE — Progress Notes (Signed)
 Preoperative Consultation        [facility]                                  [address], [address], [place] [address]         Phone: [phone]      DIA DONATE  Date of Birth:  28-Dec-1965    Date of Service:  10/16/2023    Vitals:    10/16/23 1143   BP: 132/76   BP Site: Left Upper Arm   Patient Position: Sitting   BP Cuff Size: Small Adult   Pulse: 66   Resp: 17   SpO2: 96%   Weight: 62.9 kg (138 lb 9.6 oz)   Height: 1.588 m (5' 2.5)      Wt Readings from Last 2 Encounters:   10/16/23 62.9 kg (138 lb 9.6 oz)   10/07/23 63 kg (139 lb)     BP Readings from Last 3 Encounters:   10/16/23 132/76   09/17/23 115/78   09/07/23 (!) 140/74        Chief Complaint   Patient presents with    Pre-op Exam     10/26/23 hand surgery   Susan Duwaine LABOR, MD Primary     Procedure Laterality Anesthesia  RIGHT DE QUERVAINS RELEASE             Allergies   Allergen Reactions    Food Hives     Sharp cheeses, high mold content food, some nuts    Other Anaphylaxis     ALLERGY SHOT IN THE PAST    Prednazoline Headaches     Prednisones Makes bp rise     Cipro Xr Other (See Comments)     Muscle pain    Sulfa Antibiotics Hives     Outpatient Medications Marked as Taking for the 10/16/23 encounter (Office Visit) with Geralene Meter, MD   Medication Sig Dispense Refill    Propylene Glycol (VISTA MEIBO TEARS) 0.6 % SOLN Apply 1 drop to eye daily (Patient taking differently: Apply 1 drop to eye as needed) 4 each 1    Perfluorohexyloctane  1.338 GM/ML SOLN Apply 1 drop to eye daily (Patient taking differently: Apply 1 drop to eye as needed) 3 mL 1    EPINEPHrine  (EPIPEN ) 0.3 MG/0.3ML SOAJ injection USE 1 PEN INTRAMUSCULARLY AR NEEDED ONLY FOR SEVERE REACTION FOR 1 DAY 2 each 0    levocetirizine (XYZAL ) 5 MG tablet Take 1 tablet by mouth nightly 90 tablet 3    metoprolol  succinate (TOPROL  XL) 25 MG extended release tablet Take 1.5 tablets by mouth daily Take 1-1/2 (1.5)  tablets a day. 135 tablet 3    mometasone  (NASONEX ) 50 MCG/ACT nasal spray 2 sprays by Nasal route daily 3 each 3    montelukast  (SINGULAIR ) 10 MG tablet Take 1 tablet by mouth nightly 90 tablet 3    olmesartan  (BENICAR ) 40 MG tablet Take 1 tablet by mouth daily 90 tablet 3    pantoprazole  (PROTONIX ) 40 MG tablet Take 1 tablet by mouth daily 90 tablet 3    rosuvastatin  (CRESTOR ) 20 MG tablet Take 1 tablet by mouth nightly 90 tablet 3    sodium chloride  1 g tablet Take 1 tablet by mouth 2 times daily (with meals) 180 tablet 3    aspirin  81 MG EC tablet Take 1 tablet by  mouth daily         This patient presents to the office today for a preoperative consultation at the request of surgeon, Dr. Duwaine Richard , who plans on performing Right DeQuervanis Release on August 4th  at [facility].  The current problem began 8 weeks ago, and symptoms have been worsening with time.  Conservative therapy: Yes: not effective, which has been not very effective..    Planned anesthesia: Local   Known anesthesia problems: None   Bleeding risk: No recent or remote history of abnormal bleeding  Personal or FH of DVT/PE: No    Patient objection to receiving blood products: No    Patient Active Problem List   Diagnosis    Seasonal allergies    Mixed hyperlipidemia    Essential hypertension    Chronic hyponatremia    Family history of elevated lipoprotein (a)    ETD (eustachian tube dysfunction)    Gastroesophageal reflux disease    Dry eyes    Prediabetes    Weight gain    Tobacco use    SIADH (syndrome of inappropriate ADH production)    De Quervain's tenosynovitis       Past Medical History:   Diagnosis Date    Allergic rhinitis     Arthritis     GERD (gastroesophageal reflux disease)     Hiatal hernia     Hypertension     SIADH (syndrome of inappropriate ADH production)     Thyroid disease     cysts     Past Surgical History:   Procedure Laterality Date    COLONOSCOPY      2011    COLONOSCOPY N/A 02/19/2021     COLONOSCOPY POLYPECTOMY SNARE/COLD BIOPSY performed by Marshal Faden, MD at [facility]COPY    ESOPHAGOGASTRODUODENOSCOPY       Family History   Problem Relation Age of Onset    Diabetes Mother         borderline    Kidney Disease Father     Dementia Father      Social History     Socioeconomic History    Marital status: Married     Spouse name: Not on file    Number of children: Not on file    Years of education: Not on file    Highest education level: Not on file   Occupational History    Not on file   Tobacco Use    Smoking status: Every Day     Current packs/day: 1.00     Average packs/day: 1 pack/day for 39.6 years (39.6 ttl pk-yrs)     Types: Cigarettes     Start date: 03/24/1984    Smokeless tobacco: Never   Vaping Use    Vaping status: Never Used   Substance and Sexual Activity    Alcohol use: No    Drug use: Never    Sexual activity: Not on file   Other Topics Concern    Not on file   Social History Narrative    Not on file     Social Drivers of Health     Financial Resource Strain: Low Risk  (08/11/2022)    Overall Financial Resource Strain (CARDIA)     Difficulty of Paying Living Expenses: Not hard at all   Food Insecurity: No Food Insecurity (06/18/2023)    Hunger Vital Sign     Worried About Running Out of Food in the Last Year: Never true  Ran Out of Food in the Last Year: Never true   Transportation Needs: No Transportation Needs (06/18/2023)    PRAPARE - Therapist, art (Medical): No     Lack of Transportation (Non-Medical): No   Physical Activity: Not on file   Stress: Not on file   Social Connections: Not on file   Intimate Partner Violence: Unknown (04/11/2022)    Received from [employer] and [employer], [employer] and [employer]     Feel physically or emotionally unsafe where currently live: Not on file     Harm by anyone: Not on file     Emotionally Harmed: Not on file   Housing Stability: Low Risk  (06/18/2023)     Housing Stability Vital Sign     Unable to Pay for Housing in the Last Year: No     Number of Times Moved in the Last Year: 0     Homeless in the Last Year: No       Review of Systems  Negative unless stated otherwise       Physical Exam   Constitutional: She is oriented to person, place, and time. She appears well-developed and well-nourished. No distress.   HENT:   Head: Normocephalic and atraumatic.   Cardiovascular: Normal rate, regular rhythm, normal heart sounds and intact distal pulses.  Exam reveals no gallop and no friction rub.  No murmur heard.  Pulmonary/Chest: Effort normal and breath sounds normal. No respiratory distress. She has no wheezes. She has no rales.   Abdominal: Soft. Normal aorta and bowel sounds are normal. She exhibits no distension and no mass. There is no hepatosplenomegaly. No tenderness.   Neurological: She is alert and oriented to person, place, and time.    Skin: Skin is warm and dry. No rash noted. No erythema.   Psychiatric: She has a normal mood and affect. Her behavior is normal.     EKG Interpretation:  Last EKG reviewed from 2023. NSR. Axis and Qtc normal. No abnormal ST elevation.     Lab Review   Hospital Outpatient Visit on 09/07/2023   Component Date Value    Sodium 09/07/2023 132 (L)     Potassium 09/07/2023 4.6     Chloride 09/07/2023 99     CO2 09/07/2023 24     Anion Gap 09/07/2023 9     Glucose 09/07/2023 102 (H)     BUN 09/07/2023 10     Creatinine 09/07/2023 0.7     Est, Glom Filt Rate 09/07/2023 >90     Calcium  09/07/2023 9.1     TSH Reflex FT4 09/07/2023 1.80    Hospital Outpatient Visit on 06/19/2023   Component Date Value    Sodium 06/19/2023 132 (L)     Potassium 06/19/2023 4.5     Chloride 06/19/2023 98 (L)     CO2 06/19/2023 24     Anion Gap 06/19/2023 10     Glucose 06/19/2023 122 (H)     BUN 06/19/2023 12     Creatinine 06/19/2023 0.8     Est, Glom Filt Rate 06/19/2023 86     Calcium  06/19/2023 9.8       Recent labs from [facility] (BMP) Sodium 134      Assessment/Plan:     Pre-operative evaluation  2. De Quervain's tenosynovitis  Susan [name] is a 58 year old female with a history of SIADH (on 1g NaCl BID), essential hypertension, hyperlipidemia, prediabetes, and tobacco  use, presenting for preoperative assessment prior to undergoing right Everitt Curt release scheduled for August 4th at [facility]. The current orthopedic condition has been present for approximately 8 weeks and has progressively worsened despite conservative therapy, which has been minimally effective. The patient is under the care of Dr. Duwaine Richard (surgeon).    She reports no current cardiac symptoms and has excellent functional capacity (>10 METS). There is no known history of CAD, CHF, valvular disease, or arrhythmia. Her RCRI score is 0, suggesting low perioperative cardiac risk.    RCRI risk score 0  Functional Capacity:  > 10 METS    Preoperative workup as follows: none  Change current medications as follows: ----Hold medications on morning of surgery with sip of water, and hold all other medications until after surgery  Aspirin  hold: Hold 5-7 days before   Olmesartan : Hold on the day of surgery   Prophylaxis for cardiac events with   A)  perioperative beta-blockers: Continue bblocker as pt is already currently taking  metoprolol   B)  perioperative statins: Continue statin as patient is already taking.  Deep vein thrombosis prophylaxis: regimen to be chosen by surgical team    Known risk factors for perioperative complications: Hypertension, Tobacco abuse  As outlined above, measures were taken to assess risk, and decrease risk when possible.  Risks of surgery were discussed with patient, and benefits believed to outweigh risks.  Patient is making an informed decision to proceed with surgery with an understanding of any risk,  Please follow all pre-op recommendations above.    3. Essential hypertension  4. SIADH (syndrome of inappropriate ADH production)  5.  Prediabetes  -She has an elevated coronary artery calcium  (CAC) score of 235 (99th percentile), and a CT scan from several years ago showed mild to moderate aortic plaque. A treadmill stress test was negative for ischemia, and echocardiogram was normal. She has an elevated Lp(a) but her LDL is well controlled on rosuvastatin .  -Medications include metoprolol , rosuvastatin , olmesartan , montelukast , levocetirizine, mometasone , pantoprazole , and aspirin . Currently on Sodium salt tabs. Last BMP check on care everywhere , sodium of 132.   -Per protocol, aspirin  and olmesartan  will be held per surgical guidelines. Beta-blocker and statin will be continued perioperatively. Deep vein thrombosis prophylaxis to be determined by the surgical team.    6. Tobacco use  -The patient has a long-standing history of tobacco use. Continued use poses ongoing risk for cardiovascular disease, impaired wound healing, and poor surgical outcomes. Given her CAC score and upcoming surgery, cessation is critical. Discussed it with the patient.  -Strongly advised complete cessation.  -Offered nicotine replacement therapy (patch/gum) and pharmacologic options (e.g., bupropion , varenicline); patient declined today.  -Documented as ongoing concern; to readdress at next follow-up.

## 2023-10-22 NOTE — Telephone Encounter (Signed)
 General Question     Subject: CAN SHE TAKE TYLENOL  EXTRA STRENGTH BEFORE SURGERY MONDAY   Patient and /or Facility Request: Susan [name], Susan [name]   Contact Number: [phone]

## 2023-10-23 NOTE — Telephone Encounter (Signed)
 R/C TO PATIENT. LVM    OK TO TAKE TYLENOL  MORNING OF SX

## 2023-10-25 NOTE — Discharge Instructions (Addendum)
 ORTHOPAEDICS AND SPORTS MEDICINE           HAND SURGERY - MEGAN RUDOLPH, MD                              POST-OPERATIVE DISCHARGE INSTRUCTIONS    PRESCRIPTIONS:  You have been given a prescription to be taken as directed for post-operative pain control.  Take over-the-counter Colace, 100mg  by mouth twice a day while taking narcotic pain medications to help prevent constipation.  You may also take over the counter ibuprofen /aleve  and tylenol  for pain. Take this as directed on the packaging. Do not exceed 3000 mg tylenol /acetaminophen  in 24 hours.     Ibuprofen  600-800 mg (3-4 tablets) by mouth every 6 hours as needed for pain.      OR   Aleve  220 mg (1 tablets) by mouth every 12 hours (twice daily) as needed for pain.      AND   Tylenol  500 mg every 8 hours as needed for pain.    4. Please use your pain medication carefully, as refills are limited and you may not be provided with one.   5. Prescriptions are only filled in person during a clinic visit.   6. As stated above, please  use over the counter pain medicine - it will also be helpful with decreasing your swelling.       ANESTHESIA:  1.After your surgery, post-surgical discomfort or pain is likely. This discomfort can last several days to a few weeks. At certain times of the day your discomfort may be more intense.     2. Did you receive a nerve block? A nerve block can provide pain relief for one hour to two days after your surgery. As long as the nerve block is working, you will experience little or no sensation in the area the surgeon operated on. As the nerve block wears off, you will begin to experience pain or discomfort. It is very important that you begin taking your prescribed pain medication before the nerve block fully wears off. Treating your pain at the first sign of the block wearing off will ensure your pain is better controlled and more tolerable when full-sensation returns. Do not wait until the pain is intolerable, as the medicine will  be less effective. It is better to treat pain in advance than to try and catch up.     3. General Anesthesia:   If you did not receive a nerve block during your surgery, you will need to start taking your pain medication shortly after your surgery and should continue to do so as prescribed by your surgeon.      ICE AND ELEVATION  You may use ice for the first 48-72 hours, but it is not critical.      2. Motion of your fingers is very important s to decrease the swelling. Please follow the finger range of motion exercises below to assist you in regaining your motion. This should be done at least 10 repetitions 3-4 times a day.     3. Elevation, as much as possible for the next 48 hours, is critical for decreasing swelling as well as for pain relief. Elevation means when you are seated or lying down, you hand should be at or above your heart. When walking, the hand needs to be at or above the level of your elbow.     4. If the bandage gets too tight, it may  need to be loosened. Please contact our office and we will instruct you in how to do this.     SURGICAL BANDAGES:     Your bandages can be removed 4 days after surgery. You can wash your hand after that time but do not submerge your hand underwater for the first two weeks  Leave your steri strips on your incision   You can apply a dry dressing over your incision if desired but this is not necessary      HAND THERAPY:   You may need therapy in the future for splinting and improving your motion. If so, we will set this up for you at one of your clinic visits.     ACTIVITY AND WORK:  You are encouraged to move any fingers which are not in the bandage. Attached below is an instruction sheet on hand and wrist motion. Do this as much as you need to make your fingers move fully and keep the swelling down.   Light use of the fingers is allowed to assist the other hand with daily hygiene and eating, but strong gripping or lifting is often uncomfortable and should be  avoided.   You might miss a variable period of time from work or school after surgery   Avoid lifting anything more than 2 lbs for the first two weeks     SCAR:   Please DO NOT apply any lotions or creams on your scar until you have been instructed to do so by Dr. Elene Griffes or your Hand Therapist.   2. The scar will continue to heal for up to 1 year and will become much less visible and tender. The tenderness can be improved by rubbing the scar gently and using lotion after the stitches have been removed.      CONCERNS:  If your discomfort or swelling increases, after initially getting better in the first few days, infection may be a concern. Normal time for infection is the fourth or fifth day.   If you experience symptoms that you are concerned about, please contact our office [phone]      FOLLOW-UP APPOINTMENT:   If your post-operative appointment has not already been arranged, Please call our office at [phone] to make the appointment to see Dr. Elene Griffes in about 10-14 days         POSTOPERATIVE HAND AND WRIST EXERCISES:           ANESTHESIA DISCHARGE INSTRUCTIONS    You are under the influence of drugs- do not drink alcohol, drive a car, operate machinery(such as power tools, kitchen appliances, etc), sign legal documents, or make any important decisions for 24 hours (or while on pain medications).   Children should not ride bikes or [employer] or play on gym sets  for 24 hours after surgery.  A responsible adult should be with you for 24 hours.  Rest at home today- increase activity as tolerated.  Progress slowly to a regular diet unless your physician has instructed you otherwise. Drink plenty of water.    CALL YOUR DOCTOR IF YOU:  Have moderate to severe nausea or vomiting AND are unable to hold down fluids or prescribed medications.  Have bright red bloody drainage from your dressing that won't stop oozing.  Do not get relief with your pain medication    NORMAL (POSSIBLE) SIDE EFFECTS FROM  ANESTHESIA:     Confusion, temporary memory loss, delayed reaction times in the first 24 hours  Lightheadedness, dizziness, difficulty focusing,  blurred vision  Nausea/vomiting can happen  Shivering, feeling cold, sore throat, cough and muscle aches should stop within 24-48 hours  Trouble urinating - call your surgeon if it has been more than 8 hrs  Bruising or soreness at the IV site - call if it remains red, firm or there is drainage             FEMALES OF CHILDBEARING AGE WHO ARE TAKING BIRTH CONTROL PILLS:  You may have received a medication during your procedure that interferes with the   actions of birth control pills (Bridion or Emend). Use some other kind of birth control in addition to your pills, like a condom, for 1 month after your procedure to prevent unwanted pregnancy.    The following instructions are to be followed if you have a known history or diagnosis of sleep apnea:  For all sleep apnea patients:  ? Sleep on your side or sitting up in a chair whenever possible, especially the first 24 hours after surgery.  ? Use only medicines prescribed by your doctor.    ? Do not drink alcohol.  ? If you have a dental device to assist you while at rest, use it at all times for the first 24 hours.  For patients using CPAP machines:  ? Use your CPAP machine during all periods of sleep as usual.  ? Use your CPAP machine during all periods of daytime rest while on pain medicines.  ** Follow up with your primary care doctor for continued care.    IF YOU DO NOT TAKE ALL OF YOUR NARCOTIC PAIN MEDICATION, please dispose of them responsibly. There are drop off boxes in the Emergency Departments 24/7 at both [facility] and [place]. If these locations are not convenient, other options for discarding them can be found at:  [email or web address]    Hospital or office staff may NOT accept any medications to drop off in the cabinet for you.

## 2023-10-26 ENCOUNTER — Inpatient Hospital Stay: Payer: PRIVATE HEALTH INSURANCE

## 2023-10-26 MED ORDER — HYDROMORPHONE 0.5MG/0.5ML IJ SOLN
1 | Status: DC | PRN
Start: 2023-10-26 — End: 2023-10-26

## 2023-10-26 MED ORDER — OXYMETAZOLINE HCL 0.05 % NA SOLN
0.05 | Freq: Two times a day (BID) | NASAL | Status: AC | PRN
Start: 2023-10-26 — End: 2023-10-26

## 2023-10-26 MED ORDER — HYDROCODONE-ACETAMINOPHEN 5-325 MG PO TABS
5-325 | ORAL_TABLET | Freq: Three times a day (TID) | ORAL | 0 refills | Status: AC | PRN
Start: 2023-10-26 — End: 2023-10-31

## 2023-10-26 MED ORDER — PROPOFOL 1000 MG/100ML IV EMUL
1000 | INTRAVENOUS | Status: AC
Start: 2023-10-26 — End: ?

## 2023-10-26 MED ORDER — OXYMETAZOLINE HCL 0.05 % NA SOLN
0.05 | Freq: Once | NASAL | Status: AC
Start: 2023-10-26 — End: 2023-10-26
  Administered 2023-10-26: 12:00:00 2 via NASAL

## 2023-10-26 MED ORDER — LIDOCAINE HCL 1% INJ (MIXTURES ONLY)
1 | INTRAMUSCULAR | Status: DC | PRN
Start: 2023-10-26 — End: 2023-10-26
  Administered 2023-10-26: 13:00:00 10 via INTRAMUSCULAR

## 2023-10-26 MED ORDER — SODIUM CHLORIDE (PF) 0.9 % IJ SOLN
0.9 | INTRAMUSCULAR | Status: AC
Start: 2023-10-26 — End: ?

## 2023-10-26 MED ORDER — SODIUM CHLORIDE 0.9 % IV SOLN
0.9 | INTRAVENOUS | Status: DC | PRN
Start: 2023-10-26 — End: 2023-10-26

## 2023-10-26 MED ORDER — OXYCODONE HCL 5 MG PO TABS
5 | ORAL | Status: DC | PRN
Start: 2023-10-26 — End: 2023-10-26

## 2023-10-26 MED ORDER — NORMAL SALINE FLUSH 0.9 % IV SOLN
0.9 | Freq: Two times a day (BID) | INTRAVENOUS | Status: DC
Start: 2023-10-26 — End: 2023-10-26

## 2023-10-26 MED ORDER — DIPHENHYDRAMINE HCL 25 MG PO TABS
25 | Freq: Once | ORAL | Status: AC
Start: 2023-10-26 — End: 2023-10-26
  Administered 2023-10-26: 12:00:00 25 mg via ORAL

## 2023-10-26 MED ORDER — DEXMEDETOMIDINE HCL 200 MCG/2ML IV SOLN
200 | Freq: Once | INTRAVENOUS | Status: DC | PRN
Start: 2023-10-26 — End: 2023-10-26
  Administered 2023-10-26: 13:00:00 5 via INTRAVENOUS

## 2023-10-26 MED ORDER — DIPHENHYDRAMINE HCL 50 MG/ML IJ SOLN
50 | Freq: Once | INTRAMUSCULAR | Status: DC | PRN
Start: 2023-10-26 — End: 2023-10-26

## 2023-10-26 MED ORDER — PROPOFOL 200 MG/20ML IV EMUL
200 | INTRAVENOUS | Status: AC
Start: 2023-10-26 — End: ?

## 2023-10-26 MED ORDER — MEPERIDINE HCL 50 MG/ML IJ SOLN
50 | INTRAMUSCULAR | Status: DC | PRN
Start: 2023-10-26 — End: 2023-10-26

## 2023-10-26 MED ORDER — NORMAL SALINE FLUSH 0.9 % IV SOLN
0.9 | INTRAVENOUS | Status: DC | PRN
Start: 2023-10-26 — End: 2023-10-26

## 2023-10-26 MED ORDER — LACTATED RINGERS IV SOLN
INTRAVENOUS | Status: DC
Start: 2023-10-26 — End: 2023-10-26
  Administered 2023-10-26: 13:00:00 via INTRAVENOUS

## 2023-10-26 MED ORDER — FENTANYL CITRATE (PF) 100 MCG/2ML IJ SOLN
100 | INTRAMUSCULAR | Status: AC
Start: 2023-10-26 — End: ?

## 2023-10-26 MED ORDER — FENTANYL CITRATE (PF) 100 MCG/2ML IJ SOLN
100 | Freq: Once | INTRAMUSCULAR | Status: DC | PRN
Start: 2023-10-26 — End: 2023-10-26
  Administered 2023-10-26 (×2): 50 via INTRAVENOUS

## 2023-10-26 MED ORDER — DEXMEDETOMIDINE HCL 200 MCG/2ML IV SOLN
200 | INTRAVENOUS | Status: AC
Start: 2023-10-26 — End: ?

## 2023-10-26 MED ORDER — SODIUM CHLORIDE 0.9 % IR SOLN
0.9 | Status: AC | PRN
Start: 2023-10-26 — End: 2023-10-26
  Administered 2023-10-26: 13:00:00 150

## 2023-10-26 MED ORDER — MIDAZOLAM HCL 2 MG/2ML IJ SOLN
2 | Freq: Once | INTRAMUSCULAR | Status: DC | PRN
Start: 2023-10-26 — End: 2023-10-26
  Administered 2023-10-26: 13:00:00 2 via INTRAVENOUS

## 2023-10-26 MED ORDER — MIDAZOLAM HCL 2 MG/2ML IJ SOLN
2 | INTRAMUSCULAR | Status: AC
Start: 2023-10-26 — End: ?

## 2023-10-26 MED ORDER — LIDOCAINE HCL 2 % IJ SOLN
2 | INTRAMUSCULAR | Status: AC
Start: 2023-10-26 — End: ?

## 2023-10-26 MED ORDER — SODIUM CHLORIDE (PF) 0.9 % IJ SOLN
0.9 | INTRAMUSCULAR | Status: DC | PRN
Start: 2023-10-26 — End: 2023-10-26

## 2023-10-26 MED ORDER — PROPOFOL 200 MG/20ML IV EMUL
200 | INTRAVENOUS | Status: DC | PRN
Start: 2023-10-26 — End: 2023-10-26
  Administered 2023-10-26: 13:00:00 200 via INTRAVENOUS

## 2023-10-26 MED ORDER — MIDAZOLAM HCL 2 MG/2ML IJ SOLN
2 | Freq: Once | INTRAMUSCULAR | Status: DC | PRN
Start: 2023-10-26 — End: 2023-10-26

## 2023-10-26 MED ORDER — LIDOCAINE HCL 2 % IJ SOLN
2 | Freq: Once | INTRAMUSCULAR | Status: DC | PRN
Start: 2023-10-26 — End: 2023-10-26
  Administered 2023-10-26: 13:00:00 20 via INTRAVENOUS

## 2023-10-26 MED ORDER — LABETALOL HCL 5 MG/ML IV SOLN
5 | INTRAVENOUS | Status: DC | PRN
Start: 2023-10-26 — End: 2023-10-26

## 2023-10-26 MED ORDER — ONDANSETRON HCL 4 MG/2ML IJ SOLN
4 | INTRAMUSCULAR | Status: DC | PRN
Start: 2023-10-26 — End: 2023-10-26

## 2023-10-26 MED FILL — FENTANYL CITRATE (PF) 100 MCG/2ML IJ SOLN: 100 MCG/2ML | INTRAMUSCULAR | Qty: 2 | Fill #0

## 2023-10-26 MED FILL — DIPRIVAN 1000 MG/100ML IV EMUL: 1000 MG/100ML | INTRAVENOUS | Qty: 100 | Fill #0

## 2023-10-26 MED FILL — 12 HOUR NASAL DECONGESTANT 0.05 % NA SOLN: 0.05 % | NASAL | Qty: 30 | Fill #0

## 2023-10-26 MED FILL — SODIUM CHLORIDE (PF) 0.9 % IJ SOLN: 0.9 % | INTRAMUSCULAR | Qty: 20 | Fill #0

## 2023-10-26 MED FILL — LIDOCAINE HCL 2 % IJ SOLN: 2 % | INTRAMUSCULAR | Qty: 20 | Fill #0

## 2023-10-26 MED FILL — DEXMEDETOMIDINE HCL 200 MCG/2ML IV SOLN: 200 MCG/2ML | INTRAVENOUS | Qty: 2 | Fill #0

## 2023-10-26 MED FILL — DIPRIVAN 200 MG/20ML IV EMUL: 200 MG/20ML | INTRAVENOUS | Qty: 20 | Fill #0

## 2023-10-26 MED FILL — DIPHENHYDRAMINE HCL 25 MG PO TABS: 25 mg | ORAL | Qty: 1 | Fill #0

## 2023-10-26 MED FILL — MIDAZOLAM HCL 2 MG/2ML IJ SOLN: 2 mg/mL | INTRAMUSCULAR | Qty: 2 | Fill #0

## 2023-10-26 NOTE — H&P (Signed)
 Preoperative H&P Update    The patient's History and Physical in the medical record was reviewed by me today.  I reviewed the HPI, medications, allergies, reason for surgery, diagnosis and treatment plan and there has been no interval change.    The patient was examined by me today. Physical exam findings for this update include:    BP (!) 140/69   Pulse 69   Temp 98.2 F (36.8 C) (Oral)   Resp 16   Ht 1.588 m (5' 2.5)   Wt 62.6 kg (138 lb)   LMP 07/19/2016   SpO2 98%   BMI 24.84 kg/m   Airway is intact  Chest: breathing comfortably  Heart: regular rate  Findings on exam of the body region where surgery is to be performed include: see last office and/or consult note      Electronically signed by Duwaine DELENA Richard, MD on 10/26/2023 at 8:01 AM

## 2023-10-26 NOTE — Anesthesia Pre-Procedure Evaluation (Signed)
 Department of Anesthesiology  Preprocedure Note       Name:  Susan [name]   Age:  58 y.o.  DOB:  [birth year]                                          MRN:  [id number]         Date:  10/26/2023      Surgeon: Clotilde):  Dolores Duwaine LABOR, MD    Procedure: Procedure(s):  RIGHT DEQUERVAINS RELEASE    Medications prior to admission:   Prior to Admission medications    Medication Sig Start Date End Date Taking? Authorizing Provider   Propylene Glycol (VISTA MEIBO TEARS) 0.6 % SOLN Apply 1 drop to eye daily  Patient taking differently: Apply 1 drop to eye as needed 06/22/23  Yes Abi-Rached, Johnny D, DO   Perfluorohexyloctane  1.338 GM/ML SOLN Apply 1 drop to eye daily  Patient taking differently: Apply 1 drop to eye as needed 06/22/23  Yes Dannielle Stabs, MD   EPINEPHrine  (EPIPEN ) 0.3 MG/0.3ML SOAJ injection USE 1 PEN INTRAMUSCULARLY AR NEEDED ONLY FOR SEVERE REACTION FOR 1 DAY 01/09/23  Yes Savaliya, Rishikesh, DO   levocetirizine (XYZAL ) 5 MG tablet Take 1 tablet by mouth nightly 01/09/23  Yes Savaliya, Rishikesh, DO   metoprolol  succinate (TOPROL  XL) 25 MG extended release tablet Take 1.5 tablets by mouth daily Take 1-1/2 (1.5) tablets a day. 01/09/23  Yes Savaliya, Rishikesh, DO   mometasone  (NASONEX ) 50 MCG/ACT nasal spray 2 sprays by Nasal route daily 01/09/23 01/09/24 Yes Savaliya, Rishikesh, DO   montelukast  (SINGULAIR ) 10 MG tablet Take 1 tablet by mouth nightly 01/09/23  Yes Savaliya, Rishikesh, DO   pantoprazole  (PROTONIX ) 40 MG tablet Take 1 tablet by mouth daily 01/09/23  Yes Savaliya, Rishikesh, DO   rosuvastatin  (CRESTOR ) 20 MG tablet Take 1 tablet by mouth nightly 01/09/23  Yes Savaliya, Rishikesh, DO   sodium chloride  1 g tablet Take 1 tablet by mouth 2 times daily (with meals) 01/09/23  Yes Savaliya, Rishikesh, DO   olmesartan  (BENICAR ) 40 MG tablet Take 1 tablet by mouth daily 01/09/23   Savaliya, Rishikesh, DO   aspirin  81 MG EC tablet Take 1 tablet by mouth daily    [provider]       Current  medications:    Current Facility-Administered Medications   Medication Dose Route Frequency Provider Last Rate Last Admin   . lactated ringers  infusion   IntraVENous Continuous Hari, Sunil B, MD       . sodium chloride  flush 0.9 % injection 5-40 mL  5-40 mL IntraVENous 2 times per day Hari, Sunil B, MD       . sodium chloride  flush 0.9 % injection 5-40 mL  5-40 mL IntraVENous PRN Hari, Sunil B, MD       . 0.9 % sodium chloride  infusion   IntraVENous PRN Hari, Sunil B, MD       . midazolam  (VERSED ) IntraVENous 2 mg  2 mg IntraVENous Once PRN Hari, Sunil B, MD       . diphenhydrAMINE  (BENADRYL ) tablet 25 mg  25 mg Oral Once Finch Costanzo H, MD       . oxymetazoline  (AFRIN) 0.05 % nasal spray 2 spray  2 spray Each Nostril Once Eh Sesay H, MD       . oxymetazoline  (AFRIN) 0.05 % nasal spray 2 spray  2  spray Each Nostril BID PRN Hollis Eva DEL, MD           Allergies:    Allergies   Allergen Reactions   . Food Hives     Sharp cheeses, high mold content food, some nuts   . Other Anaphylaxis     ALLERGY SHOT IN THE PAST   . Prednazoline Headaches     Prednisones Makes bp rise    . Cipro Xr Other (See Comments)     Muscle pain   . Sulfa Antibiotics Hives       Problem List:    Patient Active Problem List   Diagnosis Code   . Seasonal allergies J30.2   . Mixed hyperlipidemia E78.2   . Essential hypertension I10   . Chronic hyponatremia E87.1   . Family history of elevated lipoprotein (a) Z83.430   . ETD (eustachian tube dysfunction) H69.90   . Gastroesophageal reflux disease K21.9   . Dry eyes H04.123   . Prediabetes R73.03   . Weight gain R63.5   . Tobacco use Z72.0   . 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 De Quervain's tenosynovitis M65.4       Past Medical History:        Diagnosis Date   . Allergic rhinitis    . Arthritis    . GERD (gastroesophageal reflux disease)    . Hiatal hernia    . Hypertension    . SIADH (syndrome of inappropriate ADH production)    . Thyroid disease     cysts        Past Surgical History:        Procedure Laterality Date   . COLONOSCOPY      2011   . COLONOSCOPY N/A 02/19/2021    COLONOSCOPY POLYPECTOMY SNARE/COLD BIOPSY performed by Marshal Faden, MD at [facility] ENDOSCOPY   . ESOPHAGOGASTRODUODENOSCOPY         Social History:    Social History     Tobacco Use   . Smoking status: Every Day     Current packs/day: 1.00     Average packs/day: 1 pack/day for 39.6 years (39.6 ttl pk-yrs)     Types: Cigarettes     Start date: 03/24/1984   . Smokeless tobacco: Never   Substance Use Topics   . Alcohol use: No                                Ready to quit: Not Answered  Counseling given: Not Answered      Vital Signs (Current):   Vitals:    10/16/23 1537 10/26/23 0701   BP:  (!) 140/69   Pulse:  69   Resp:  16   Temp:  98.2 F (36.8 C)   TempSrc:  Oral   SpO2:  98%   Weight: 62.6 kg (138 lb)    Height: 1.588 m (5' 2.5)                                               BP Readings from Last 3 Encounters:   10/26/23 (!) 140/69   10/16/23 132/76   09/17/23 115/78       NPO Status: Time of last liquid consumption: 1930  Time of last solid consumption: 1930                        Date of last liquid consumption: 10/25/23                        Date of last solid food consumption: 10/25/23    BMI:   Wt Readings from Last 3 Encounters:   10/16/23 62.6 kg (138 lb)   10/16/23 62.9 kg (138 lb 9.6 oz)   10/07/23 63 kg (139 lb)     Body mass index is 24.84 kg/m.    CBC:   Lab Results   Component Value Date/Time    WBC 8.9 08/11/2022 11:32 AM    RBC 4.29 08/11/2022 11:32 AM    HGB 13.5 08/11/2022 11:32 AM    HCT 40.2 08/11/2022 11:32 AM    MCV 93.7 08/11/2022 11:32 AM    RDW 14.6 08/11/2022 11:32 AM    PLT 388 08/11/2022 11:32 AM       CMP:   Lab Results   Component Value Date/Time    NA 132 09/07/2023 04:46 PM    K 4.6 09/07/2023 04:46 PM    CL 99 09/07/2023 04:46 PM    CO2 24 09/07/2023 04:46 PM    BUN 10 09/07/2023 04:46 PM    CREATININE 0.7 09/07/2023 04:46 PM    GFRAA  >60 11/15/2020 11:30 AM    GFRAA >60 05/09/2010 04:45 PM    AGRATIO 1.6 01/09/2023 02:54 PM    LABGLOM >90 09/07/2023 04:46 PM    LABGLOM >60 05/01/2022 11:12 AM    GLUCOSE 102 09/07/2023 04:46 PM    CALCIUM  9.1 09/07/2023 04:46 PM    BILITOT 0.3 01/09/2023 02:54 PM    ALKPHOS 54 01/09/2023 02:54 PM    AST 23 01/09/2023 02:54 PM    ALT 10 01/09/2023 02:54 PM       POC Tests: No results for input(s): POCGLU, POCNA, POCK, POCCL, POCBUN, POCHEMO, POCHCT in the last 72 hours.    Coags: No results found for: PROTIME, INR, APTT    HCG (If Applicable):   Lab Results   Component Value Date    PREGTESTUR Negative 05/10/2018        ABGs: No results found for: PHART, PO2ART, PCO2ART, HCO3ART, BEART, O2SATART     Type & Screen (If Applicable):  No results found for: ABORH, LABANTI    Drug/Infectious Status (If Applicable):  Lab Results   Component Value Date/Time    HIV Non-Reactive 11/15/2020 11:30 AM       COVID-19 Screening (If Applicable):   Lab Results   Component Value Date/Time    COVID19 Not Detected 12/30/2021 02:00 PM           Anesthesia Evaluation    Airway: Mallampati: III  TM distance: <3 FB   Neck ROM: full  Mouth opening: < 3 FB   Dental: normal exam         Pulmonary:normal exam  breath sounds clear to auscultation                             Cardiovascular:    (+) hypertension:        Rhythm: regular  Rate: normal                    Neuro/Psych:   (+) neuromuscular disease:  GI/Hepatic/Renal:   (+) hiatal hernia, GERD:          Endo/Other:                     Abdominal:             Vascular:          Other Findings:       Anesthesia Plan      MAC     ASA 3       Induction: intravenous.      Anesthetic plan and risks discussed with patient.      Plan discussed with CRNA.                Eva VEAR Morn, MD   10/26/2023

## 2023-10-26 NOTE — Progress Notes (Signed)
 Patient admitted to pre-op bay 7 in preparation for surgery, VSS. Consent confirmed. IV inserted into left FA, NS infusing. Belongings on cart. NPO since 1930. Family at bedside, phone number in system for text updates, call light within reach.

## 2023-10-26 NOTE — Op Note (Signed)
 OPERATIVE NOTE     Patient Name: Susan [name]   Date of Birth:  09/24/1965  MRN:  [id number]    Date of Procedure:  10/26/2023  Surgeon: Duwaine Richard, MD    PRE-OPERATIVE DIAGNOSIS:  Right DeQuervain's Tenosynovitis     POST-OPERATIVE DIAGNOSIS:  Right DeQuervain's Tenosynovitis     PROCEDURE:  Right DeQuevain's Release     ANESTHESIA: MAC and local     OPERATIVE INDICATIONS: The patient is a very pleasant 58 y.o. female who presents with radial sided wrist pain consistent with DeQuervain's Tenosynovitis. She has attempted conservative management including steroid injections but has not had improvement. Operative release of the first dorsal extensor compartment was discussed with the patient. Risks of the surgery including but not limited to nerve injury, infection, wound healing complications, bleeding and persistent pain were discussed with the patient. After this discussion, the patient decided to proceed with surgery.     OPERATIVE PROCEDURE: The patient was seen in the preoperative holding area. The operative procedure confirmed and the operative site was marked with an indelible marker.  The patient was brought to the operating room and placed supine on the table. A hand table was placed on the patient's side.  A tourniquet was placed on the patient's forearm.  A local block was performed using 10 cc of 1:1 1% lidocaine  and 0.25% bupivacaine . Next, the arm was prepped and draped in a sterile fashion.  A timeout was performed which correctly identified the team members, the procedure to be performed as well as the operative site.      The arm was exsanguinated using an Esmarch bandage and then tourniquet was inflated to 250 mmHg.  A 2 longitudinal incision just proximal to the radial styloid was designed and made with a 15 blade. Dissection proceeded through the subcutaneous tissue. The superficial branch of the radial nerve was identified and gently preserved. The first dorsal compartment was identified.  The dorsal aspect of the roof of the compartment was released entirely. There was a separate subsheath between APL and EPB which was identified and released. Any excess tenosynovium on the extensor tendons was debrided. Passive range of motion to the wrist and thumb was performed and there was no subluxation of the extensor tendons.     The incision was irrigated. Closure was performed with 4-0 Vicryl and Monocryl. Dressings were applied including steri-strips, sterile gauze, rolled gauze and an ACE wrap. The patient was awoken from MAC anesthesia and transported to the PACU in stable condition.      Total Tourniquet Time Documented:  Arm  (Right) - 12 minutes  Total: Arm  (Right) - 12 minutes      Specimen: None   EBL: 5cc    Complications: None   Post-operative instructions given: yes  Disposition:  The patient will be discharged from PACU and will see us  in 2 weeks to evaluate her wounds.        Electronically signed by: Duwaine DELENA Richard, MD  10/26/2023; 9:41 AM

## 2023-10-26 NOTE — Anesthesia Post-Procedure Evaluation (Signed)
 Department of Anesthesiology  Postprocedure Note    Patient: Susan [name]  MRN: [id number]  Birthdate: 23-Dec-1965  Date of evaluation: 10/26/2023    Procedure Summary       Date: 10/26/23 Room / Location: [facility] OR 02 / [facility]    Anesthesia Start: [phone] Anesthesia Stop: 0907    Procedure: RIGHT DEQUERVAINS RELEASE (Right: Wrist) Diagnosis:       Everitt Quervain's tenosynovitis      (De Quervain's tenosynovitis [M65.4])    Surgeons: Dolores Duwaine LABOR, MD Responsible Provider: Hollis Eva DEL, MD    Anesthesia Type: MAC ASA Status: 3            Anesthesia Type: No value filed.    Aldrete Phase I: Aldrete Score: 10    Aldrete Phase II: Aldrete Score: 9    Anesthesia Post Evaluation    Patient location during evaluation: PACU  Patient participation: complete - patient participated  Level of consciousness: awake and alert  Pain score: 0  Airway patency: patent  Nausea & Vomiting: no nausea and no vomiting  Cardiovascular status: blood pressure returned to baseline  Respiratory status: acceptable  Hydration status: stable  Pain management: adequate    No notable events documented.

## 2023-11-11 ENCOUNTER — Ambulatory Visit
Admit: 2023-11-11 | Discharge: 2023-11-11 | Payer: PRIVATE HEALTH INSURANCE | Attending: Student in an Organized Health Care Education/Training Program

## 2023-11-11 VITALS — Ht 62.5 in | Wt 138.0 lb

## 2023-11-11 DIAGNOSIS — M654 Radial styloid tenosynovitis [de Quervain]: Principal | ICD-10-CM
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
## 2023-12-01 ENCOUNTER — Ambulatory Visit: Admit: 2023-12-01 | Discharge: 2023-12-01 | Payer: PRIVATE HEALTH INSURANCE

## 2023-12-01 VITALS — BP 138/70 | HR 75 | Wt 137.2 lb

## 2023-12-01 DIAGNOSIS — R0981 Nasal congestion: Principal | ICD-10-CM

## 2023-12-01 MED ORDER — AMOXICILLIN-POT CLAVULANATE 875-125 MG PO TABS
875-125 | ORAL_TABLET | Freq: Two times a day (BID) | ORAL | 0 refills | Status: AC
Start: 2023-12-01 — End: 2023-12-06

## 2023-12-01 MED ORDER — AZELASTINE HCL 0.1 % NA SOLN
0.1 | Freq: Two times a day (BID) | NASAL | 1 refills | Status: AC
Start: 2023-12-01 — End: ?

## 2023-12-01 NOTE — Patient Instructions (Addendum)
 Claritin for congestion  zyrtec for drainage  Delsym for cough  Motrin and or tylenol  as needed for fevers/ pain     Honey 1 tbsp up to every 2 hours --- cough  Garlic (1 clove finely chopped in food or 1 in of paste in warm water/tea) --- antiviral & anti-inflammatory  Ginger (can be used in a tea form) --- antiviral, antibacterial, anti-inflammatory  Oregano (used in tea - 1tbsp in hot water) *comes in an oil as well  ---- antiviral, antibacterial, anti-inflammatory  Apple cider vinegar, honey, cayenne pepper (1:1 honey & acv) -- antiviral, cough suppressant, & immune booster  Turmeric & black pepper for inflammation; basil is also a natural anti-inflammatory    Homemade, natural cold medicine  Red onion and garlic in unfiltered honey for 2-3 days (covered) in a cool dark space then refrigerate for up to 6 months -- natural cough suppressant, antibacterial, & immunity booster

## 2023-12-02 NOTE — Progress Notes (Signed)
 PROGRESS NOTE   [facility]                                  [address], [address], [place] [address]         Phone: [phone]    Date of Service:  12/01/2023     Patient ID: .Susan [name] is a 58 y.o. female      Subjective:     CC: For sinus congestion and cough    HPI  58 year old female with past medical history of hypertension, GERD, SIADH, eustachian tube dysfunction, mixed hyperlipidemia, chronic hyponatremia, prediabetes presents to clinic with above-mentioned concern.    Sinus congestion and cough  Patient reported feeling sick for last 2 weeks.  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  Could not mention about having influenza or COVID but she got viral infection from her office.  Initially she was feeling congested in her throat and ears, associated with runny nose and runny eyes along with mild low-grade fever.  She used over-the-counter medications including levocetirizine, got mild relief but for last 10 days her symptoms has been persistent and she started feeling more congestion in her frontal and maxillary sinuses.  Her congestion is associated with moderate cough which is productive in nature and she has been producing yellowish-greenish color scanty amount of sputum but denies having any hemoptysis, sore throat, high-grade fever.   Due to her history of SIADH she was advised to take only selective medications due to risk of hyponatremia.    ROS:    Pertinent positives and negatives are mentioned above      Vitals:    12/01/23 1541   BP: 138/70   BP Site: Left Upper Arm   Patient Position: Sitting   BP Cuff Size: Medium Adult   Pulse: 75   SpO2: 96%   Weight: 62.2 kg (137 lb 3.2 oz)       Allergies:  Food, Other, Prednazoline, Cipro xr, and Sulfa antibiotics    Outpatient Medications Marked as Taking for the 12/01/23 encounter (Office Visit) with  Janece Specking, MD   Medication Sig Dispense Refill    azelastine  (ASTELIN ) 0.1 % nasal spray 2 sprays by Nasal route 2 times daily Use in each nostril as directed 120 mL 1    amoxicillin -clavulanate (AUGMENTIN ) 875-125 MG per tablet Take 1 tablet by mouth 2 times daily for 5 days 10 tablet 0    EPINEPHrine  (EPIPEN ) 0.3 MG/0.3ML SOAJ injection USE 1 PEN INTRAMUSCULARLY AR NEEDED ONLY FOR SEVERE REACTION FOR 1 DAY 2 each 0    levocetirizine (XYZAL ) 5 MG tablet Take 1 tablet by mouth nightly 90 tablet 3    metoprolol  succinate (TOPROL  XL) 25 MG extended release tablet Take 1.5 tablets by mouth daily Take 1-1/2 (1.5) tablets a day. 135 tablet 3    mometasone  (NASONEX ) 50 MCG/ACT nasal spray 2 sprays by Nasal route daily 3 each 3    montelukast  (SINGULAIR ) 10 MG tablet Take 1 tablet by mouth nightly 90 tablet 3    olmesartan  (BENICAR ) 40 MG tablet Take 1 tablet by mouth daily 90 tablet 3    pantoprazole  (PROTONIX ) 40 MG tablet Take 1 tablet by mouth daily 90 tablet 3    rosuvastatin  (CRESTOR ) 20 MG tablet Take 1 tablet by  mouth nightly 90 tablet 3    sodium chloride  1 g tablet Take 1 tablet by mouth 2 times daily (with meals) 180 tablet 3    aspirin  81 MG EC tablet Take 1 tablet by mouth daily         Objective:   Constitutional:   Reviewed vitals above  Well Nourished, well developed, no distress       HENT:  Normal external nose without lesions, mild nasal congestion  Bilateral TMs translucent with normal light reflex and bony landmarks  Normal oropharynx without erythema or exudate  Moderate tenderness over maxillary and frontal sinuses  Neck:  Symmetric and without masses  No thyromegaly  Resp:  Normal effort  Clear to auscultation bilaterally without rhonchi, wheezing or crackles  Cardiovascular:  On auscultation, normal S1 and S2 without murmurs, rubs or gallops  No bruits of bilateral carotids and no JVD  Gastrointestinal:  Nontender, nondistended, and no masses  No hepatosplenomegaly  Musculoskeletal:  Normal  Gait  All extremities without clubbing, cyanosis or edema  Skin:  No rashes on inspection  No areas of increased heat or induration on palpation  Psych:  Normal mood and affect  Normal insight and judgement       Assessment / Plan:     1. Sinus congestion/cough  -Acute onset for 2 weeks  -Absence of of shortness of breath, fever and no palpable lymph nodes with normal clinical respiratory exam findings; possibly upper respiratory tract symptoms are secondary to viral infection  -Given persistent symptoms and was nasal congestion along with sinuses congestion, presence of facial tenderness over maxillary sinuses, possible diagnosis is secondary bacterial sinusitis  -Advised to start taking tablet Augmentin  twice daily for 5 days  -Centor criteria scoring is 1 - no further strep pharyngitis testing recommended  - POC COVID, influenza A/B,  are checked in clinic; all negative  - Advised to continue taking Delsym and start taking natural herbal remedies daily for cough  - Advised ibuprofen for myalgia as needed  - Advised azelastine  nasal spray along with saline nasal drops and saline nebulizer for nasal congestion  - Information provided regarding normal course of viral infections and provided information regarding home remedies  Orders today;  - AMB POC COVID-19 & INFLUENZA A/B  - azelastine  (ASTELIN ) 0.1 % nasal spray; 2 sprays by Nasal route 2 times daily Use in each nostril as directed  Dispense: 120 mL; Refill: 1  - amoxicillin -clavulanate (AUGMENTIN ) 875-125 MG per tablet; Take 1 tablet by mouth 2 times daily for 5 days  Dispense: 10 tablet; Refill: 0  - loratadine (CLARITIN) 10 MG tablet; Take 1 tablet by mouth daily  Dispense: 20 tablet; Refill: 0    RTC;  Return to clinic after 1 week if symptoms do not improve or start worsening      EMR Dragon/transcription disclaimer:  Much of this encounter note is electronic transcription/translation of spoken language to printed texts.  The electronic translation of  spoken language may be erroneous, or at times, nonsensical words or phrases may be inadvertently transcribed.  Although I have reviewed the note for such errors, some may still exist.     Minta Evangelist, MD, PGY-2  [facility]   [facility]

## 2023-12-04 ENCOUNTER — Inpatient Hospital Stay: Admit: 2023-12-04 | Payer: PRIVATE HEALTH INSURANCE

## 2023-12-04 DIAGNOSIS — Z122 Encounter for screening for malignant neoplasm of respiratory organs: Secondary | ICD-10-CM

## 2023-12-04 DIAGNOSIS — Z720 Tobacco use: Principal | ICD-10-CM

## 2023-12-08 ENCOUNTER — Encounter

## 2023-12-08 MED ORDER — METOPROLOL SUCCINATE ER 25 MG PO TB24
25 | ORAL_TABLET | Freq: Every day | ORAL | 3 refills | 90.00000 days | Status: DC
Start: 2023-12-08 — End: 2024-03-21

## 2023-12-08 NOTE — Telephone Encounter (Signed)
 Refill Request     Last Seen: 12/01/2023    Last Written: 01/09/2023    Next Appointment:   No future appointments.          Requested Prescriptions     Pending Prescriptions Disp Refills    metoprolol  succinate (TOPROL  XL) 25 MG extended release tablet 135 tablet 3     Sig: Take 1.5 tablets by mouth daily Take 1-1/2 (1.5) tablets a day.

## 2023-12-08 NOTE — Telephone Encounter (Signed)
 Appointment Request     Patient requesting earlier appointment: Yes  Appointment offered to patient: ASAP   Patient Contact Number: [phone]      HAVING PAINS AS BEFORE SURGERY. DEEP ACHE IN THE UPPER WRIST.     THE LAST WEEK SHE BEGAN NOTICING  SWELLING      Pt IS SUPPOSED TO GO OFF RESTRICTION NEXT WEEK, SO SHE IS CONCERNED IF SHE IS OK TO RETURN.    Pt CAN COME IN AFTER 3:15 TO THE [facility] LOCATION IF DR RUDOLPH WANT TO SEE HER.

## 2023-12-10 NOTE — Telephone Encounter (Addendum)
 R/C TO PATIENT    LVM WITH DIRECT CALL BACK #    DR RUDOLPH DOES NOT HAVE ANY APPOINTMENTS AVAILABLE THIS WEEK AND IS OUT OF OFFICE NEXT WEEK. OFFERED TO EXTEND WORK RESTRICTIONS FOR 2 WEEKS UNTIL PATIENT COULD BE SEEN FOR APPOINTMENT ON 12/25/23 AT [facility] LOCATION. AWAITING RETURN CALL TO SEE HOW PATIENT WOULD LIKE TO PROCEED.

## 2023-12-22 ENCOUNTER — Inpatient Hospital Stay
Admit: 2023-12-22 | Discharge: 2023-12-22 | Disposition: A | Discharge status: Home / Self Care | Payer: PRIVATE HEALTH INSURANCE | Attending: Emergency Medicine

## 2023-12-22 DIAGNOSIS — N3001 Acute cystitis with hematuria: Principal | ICD-10-CM

## 2023-12-22 LAB — URINALYSIS WITH REFLEX TO CULTURE
Bilirubin, Urine: NEGATIVE
Glucose, Ur: NEGATIVE mg/dL
Ketones, Urine: NEGATIVE mg/dL
Nitrite, Urine: POSITIVE — AB
Specific Gravity, UA: 1.01 (ref 1.005–1.030)
Urobilinogen, Urine: 0.2 U/dL (ref <2.0)
pH, Urine: 6.5 (ref 5.0–8.0)

## 2023-12-22 LAB — CBC WITH AUTO DIFFERENTIAL
Basophils %: 0.7 %
Basophils Absolute: 0.1 K/uL (ref 0.0–0.2)
Eosinophils %: 2.2 %
Eosinophils Absolute: 0.3 K/uL (ref 0.0–0.6)
Hematocrit: 41.6 % (ref 36.0–48.0)
Hemoglobin: 14.1 g/dL (ref 12.0–16.0)
Lymphocytes %: 15.9 %
Lymphocytes Absolute: 2 K/uL (ref 1.0–5.1)
MCH: 32 pg (ref 26.0–34.0)
MCHC: 34 g/dL (ref 31.0–36.0)
MCV: 93.9 fL (ref 80.0–100.0)
MPV: 7 fL (ref 5.0–10.5)
Monocytes %: 7.3 %
Monocytes Absolute: 0.9 K/uL (ref 0.0–1.3)
Neutrophils %: 73.9 %
Neutrophils Absolute: 9.4 K/uL — ABNORMAL HIGH (ref 1.7–7.7)
Platelets: 409 K/uL (ref 135–450)
RBC: 4.43 M/uL (ref 4.00–5.20)
RDW: 14 % (ref 12.4–15.4)
WBC: 12.7 K/uL — ABNORMAL HIGH (ref 4.0–11.0)

## 2023-12-22 LAB — COMPREHENSIVE METABOLIC PANEL W/ REFLEX TO MG FOR LOW K
ALT: 13 U/L (ref 10–40)
AST: 23 U/L (ref 15–37)
Albumin/Globulin Ratio: 1.6 (ref 1.1–2.2)
Albumin: 4.4 g/dL (ref 3.4–5.0)
Alkaline Phosphatase: 55 U/L (ref 40–129)
Anion Gap: 10 (ref 3–16)
BUN: 10 mg/dL (ref 7–20)
CO2: 26 mmol/L (ref 21–32)
Calcium: 9.7 mg/dL (ref 8.3–10.6)
Chloride: 97 mmol/L — ABNORMAL LOW (ref 99–110)
Creatinine: 0.7 mg/dL (ref 0.6–1.1)
Est, Glom Filt Rate: >90
Glucose: 92 mg/dL (ref 70–99)
Potassium reflex Magnesium: 4.4 mmol/L (ref 3.5–5.1)
Sodium: 133 mmol/L — ABNORMAL LOW (ref 136–145)
Total Bilirubin: <0.2 mg/dL (ref 0.0–1.0)
Total Protein: 7.1 g/dL (ref 6.4–8.2)

## 2023-12-22 LAB — MICROSCOPIC URINALYSIS: RBC, UA: >100 /HPF — AB (ref 0–4)

## 2023-12-22 MED ORDER — NITROFURANTOIN MONOHYD MACRO 100 MG PO CAPS
100 | ORAL_CAPSULE | Freq: Two times a day (BID) | ORAL | 0 refills | 7.00000 days | Status: AC
Start: 2023-12-22 — End: 2023-12-27

## 2023-12-22 MED ORDER — NITROFURANTOIN MONOHYD MACRO 100 MG PO CAPS
100 | Freq: Once | ORAL | Status: AC
Start: 2023-12-22 — End: 2023-12-22
  Administered 2023-12-22: 10:00:00 100 mg via ORAL

## 2023-12-22 MED FILL — NITROFURANTOIN MONOHYD MACRO 100 MG PO CAPS: 100 mg | ORAL | Qty: 1 | Fill #0

## 2023-12-22 NOTE — ED Provider Notes (Signed)
 Emergency Department Attending Provider Note  Location: [facility] EMERGENCY DEPARTMENT  12/22/2023     Patient Identification  Susan [name] is a 58 y.o. female      YEP:Jwwzuuz M Gluth was evaluated in the Emergency Department for pelvic pressure and hematuria which started abruptly this morning. Although initial history and physical exam information was obtained by the resident physician (who also dictated a record of this visit), I personally saw the patient and performed a substantive portion of the visit including all aspects of the medical decision making.      PHYSICAL EXAM:  Physical Exam  General: Patient alert, non-toxic, not in acute distress.    Head: Atraumatic.    Respiratory: Non-labored respirations, no audible wheeze or stridor.    Cardiac: Heart rate normal  Neuro: Awake, normal speech, no obvious facial asymmetry, no dysarthria.    Psych: Normal affect and behavior observed.    Skin: No pallor, no icerus, no diaphoresis.      EKG Interpretation      Patient seen and evaluated.  Relevant records reviewed.    MDM:      Patient presented with symptoms consistent with a urinary tract infection, including dysuria, urinary frequency, and/or suprapubic discomfort. Differential included cystitis, pyelonephritis, urethritis, and other causes of genitourinary discomfort.    A focused history and physical exam were performed. Urinalysis revealed findings suggestive of infection. There were no signs concerning for systemic involvement such as fever, flank pain, or hypotension to suggest pyelonephritis or urosepsis.    Patient was diagnosed with an uncomplicated lower UTI and treated with empiric antibiotics. Pain management and hydration were addressed. Return precautions reviewed, and patient was advised to follow up with primary care or return if symptoms worsen or fail to improve.    This visit required medical decision-making of moderate complexity, including evaluation of infectious vs.  non-infectious causes of symptoms, diagnostic interpretation, and initiation of treatment.      CLINICAL IMPRESSION  1. Acute cystitis with hematuria            I personally saw the patient and independently provided 0 minutes of non-concurrent critical care out of the total shared critical care time provided.    This chart was generated in part by using Dragon Dictation system and may contain errors related to that system including errors in grammar, punctuation, and spelling, as well as words and phrases that may be inappropriate. If there are any questions or concerns please feel free to contact the dictating provider for clarification.     Emmie Rio, MD  I am the primary clinician of record.   [employer]      Rio Emmie, MD  12/22/23 [phone]

## 2023-12-22 NOTE — Discharge Instructions (Signed)
 You were seen today for a urinary tract infection (UTI). A UTI happens when germs get into your bladder and cause pain or burning when you pee. You may also feel like you have to go often or have belly discomfort.    You were given antibiotics to treat the infection. It's important to take all of the medicine, even if you start to feel better. Drink plenty of water to help flush out the infection.    You should return to the ER or call your doctor if:  - You get a fever  - You have back pain  - You feel sick to your stomach or start vomiting  - You feel worse or your symptoms do not get better in 2-3 days    Please follow up with your primary care doctor in the next 2-3 days.

## 2023-12-22 NOTE — ED Provider Notes (Signed)
 EMERGENCY DEPARTMENT RESIDENT [facility] EMERGENCY DEPARTMENT     Pt Name: Susan [name]   MRN: [id number]   Birthdate 04-08-1965   Date of evaluation: 12/22/2023   Provider: Fairy Dee, DO   PCP: Geralene Meter, MD   Note Started: 5:19 AM EDT 12/22/23     CHIEF COMPLAINT  Chief Complaint   Patient presents with    Hematuria     Patient arrives to ED via self from home for blood in urine started today. Patient reports increased frequency and urgency as well as mild abdominal pain. Reports pressure in in pelvis like she has UTI. PT has hx of SIADH          HISTORY OF PRESENT ILLNESS  History from : Patient   Limitations to history : None     Susan [name] is a 58 y.o. female who presents to the ED for hematuria.  She endorses an episode of nocturnal enuresis that woke her up.  Then when she went to the bathroom, she noticed pink discoloration of her urine.  Additionally there was pink spotting on 5 to 6 pieces of toilet paper when she wiped.  She denies any, flank pain, frequency, urgency.  However, she does endorse pelvic pain that feels heavy.    MEDICAL HISTORY   has a past medical history of Allergic rhinitis, Arthritis, GERD (gastroesophageal reflux disease), Hiatal hernia, Hypertension, SIADH (syndrome of inappropriate ADH production), and Thyroid disease.    Past Surgical History:   Procedure Laterality Date    COLONOSCOPY      2011    COLONOSCOPY N/A 02/19/2021    COLONOSCOPY POLYPECTOMY SNARE/COLD BIOPSY performed by Marshal Faden, MD at [facility] ENDOSCOPY    ESOPHAGOGASTRODUODENOSCOPY      WRIST SURGERY Right 10/26/2023    RIGHT DEQUERVAINS RELEASE performed by Dolores Duwaine LABOR, MD at [facility] OR      CURRENTMEDICATIONS       Discharge Medication List as of 12/22/2023  5:32 AM        CONTINUE these medications which have NOT CHANGED    Details   metoprolol  succinate (TOPROL  XL) 25 MG extended release tablet Take 1.5 tablets by mouth daily Take 1-1/2 (1.5) tablets a day., Disp-135 tablet,  R-3Normal      azelastine  (ASTELIN ) 0.1 % nasal spray 2 sprays by Nasal route 2 times daily Use in each nostril as directed, Disp-120 mL, R-1Normal      Propylene Glycol (VISTA MEIBO TEARS) 0.6 % SOLN Apply 1 drop to eye daily, Disp-4 each, R-1Normal      Perfluorohexyloctane  1.338 GM/ML SOLN Apply 1 drop to eye daily, Disp-3 mL, R-1Normal      EPINEPHrine  (EPIPEN ) 0.3 MG/0.3ML SOAJ injection USE 1 PEN INTRAMUSCULARLY AR NEEDED ONLY FOR SEVERE REACTION FOR 1 DAY, Disp-2 each, R-0Normal      levocetirizine (XYZAL ) 5 MG tablet Take 1 tablet by mouth nightly, Disp-90 tablet, R-3Normal      mometasone  (NASONEX ) 50 MCG/ACT nasal spray 2 sprays by Nasal route daily, Nasal, DAILY Starting Fri 01/09/2023, Until Sat 01/09/2024, For 365 days, Disp-3 each, R-3, Normal      montelukast  (SINGULAIR ) 10 MG tablet Take 1 tablet by mouth nightly, Disp-90 tablet, R-3Normal      olmesartan  (BENICAR ) 40 MG tablet Take 1 tablet by mouth daily, Disp-90 tablet, R-3Normal      pantoprazole  (PROTONIX ) 40 MG tablet Take 1 tablet by mouth daily, Disp-90 tablet, R-3Normal  rosuvastatin  (CRESTOR ) 20 MG tablet Take 1 tablet by mouth nightly, Disp-90 tablet, R-3Normal      sodium chloride  1 g tablet Take 1 tablet by mouth 2 times daily (with meals), Disp-180 tablet, R-3Normal      aspirin  81 MG EC tablet Take 1 tablet by mouth dailyHistorical Med            SCREENINGS          Glasgow Coma Scale  Eye Opening: Spontaneous  Best Verbal Response: Oriented  Best Motor Response: Obeys commands  Glasgow Coma Scale Score: 15                CIWA Assessment  BP: 138/73  Pulse: 90             VITALS  ED Triage Vitals [12/22/23 0416]   BP Girls Systolic BP Percentile Girls Diastolic BP Percentile Boys Systolic BP Percentile Boys Diastolic BP Percentile Temp Temp Source Pulse   (!) 161/82 -- -- -- -- 98.8 F (37.1 C) Oral 92      Respirations SpO2 Height Weight       18 97 % -- --            REVIEW OF SYSTEMS  Review of Systems     PHYSICAL  EXAM  Physical Exam    _________    DIAGNOSTIC RESULTS    LABS  Labs Reviewed   CBC WITH AUTO DIFFERENTIAL - Abnormal; Notable for the following components:       Result Value    WBC 12.7 (*)     Neutrophils Absolute 9.4 (*)     All other components within normal limits   COMPREHENSIVE METABOLIC PANEL W/ REFLEX TO MG FOR LOW K - Abnormal; Notable for the following components:    Sodium 133 (*)     Chloride 97 (*)     All other components within normal limits   URINALYSIS WITH REFLEX TO CULTURE - Abnormal; Notable for the following components:    Color, UA RED (*)     Clarity, UA SL CLOUDY (*)     Blood, Urine LARGE (*)     Protein, UA TRACE (*)     Nitrite, Urine POSITIVE (*)     Leukocyte Esterase, Urine MODERATE (*)     All other components within normal limits   MICROSCOPIC URINALYSIS - Abnormal; Notable for the following components:    RBC, UA >100 (*)     All other components within normal limits   CULTURE, URINE      When ordered only abnormal lab results are displayed. All other labs were within normal range or not returned as of this dictation.     IMAGING  Non-plain film images such as CT, Ultrasound and MRI are read by the radiologist. Plain radiographic images are visualized and preliminarily interpreted by the ED Provider with the below findings:      No orders to display     PROCEDURES  Unless otherwise noted below, none.      MDM  Pt presents with hematuria concerning for UTI, pyelonephritis, hemorrhagic cystitis, nephrolithiasis.  Vital signs are stable and the patient is not in any acute distress.  Physical exam was negative for any CVA tenderness bilaterally, diminishing suspicion for pyelonephritis and nephrolithiasis.  Basic labs and urinalysis have been ordered.    Urinalysis was positive for UTI.  Patient has a slightly elevated white blood cell count of 12.7, but does not meet sepsis criteria.  Patient will be discharged with a week's course of Macrobid.  Urine culture has been ordered.  She  continues to be in stable condition at the time of discharge.     Is this patient to be included in the SEP-1 core measure? No Exclusion criteria - the patient is NOT to be included for SEP-1 Core Measure due to: 2+ SIRS criteria are not met    Patient was given the following medications in the ED:   Medications   nitrofurantoin (macrocrystal-monohydrate) (MACROBID) capsule 100 mg (100 mg Oral Given 12/22/23 0542)        CONSULTS  None    FINAL IMPRESSION    1. Acute cystitis with hematuria        DISPOSITION/PLAN     PATIENT REFERRED TO:   No follow-up provider specified.    DISCHARGE MEDICATIONS:   Discharge Medication List as of 12/22/2023  5:32 AM        START taking these medications    Details   nitrofurantoin, macrocrystal-monohydrate, (MACROBID) 100 MG capsule Take 1 capsule by mouth 2 times daily for 5 days, Disp-10 capsule, R-0Normal              DISCONTINUED MEDICATIONS:   Discharge Medication List as of 12/22/2023  5:32 AM                 (Please note that portions of this note were completed with a voice recognition program.  Efforts were made to edit the dictations but occasionally words are mis-transcribed.)       Fairy Dee, DO (electronically signed)

## 2023-12-25 ENCOUNTER — Ambulatory Visit
Admit: 2023-12-25 | Discharge: 2023-12-25 | Payer: PRIVATE HEALTH INSURANCE | Attending: Student in an Organized Health Care Education/Training Program

## 2023-12-25 VITALS — Ht 62.5 in | Wt 137.0 lb

## 2023-12-25 DIAGNOSIS — M654 Radial styloid tenosynovitis [de Quervain]: Principal | ICD-10-CM
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
## 2023-12-29 ENCOUNTER — Ambulatory Visit: Admit: 2023-12-29 | Discharge: 2023-12-29 | Payer: PRIVATE HEALTH INSURANCE

## 2023-12-29 VITALS — BP 132/78 | HR 70 | Ht 62.5 in | Wt 140.0 lb

## 2023-12-29 DIAGNOSIS — N12 Tubulo-interstitial nephritis, not specified as acute or chronic: Principal | ICD-10-CM

## 2023-12-29 LAB — POCT URINALYSIS DIPSTICK
Bilirubin, UA: NEGATIVE
Glucose, UA POC: NEGATIVE mg/dL
Ketones, UA: NEGATIVE mg/dL
Nitrite, UA: POSITIVE
Protein, UA POC: 30 mg/dL
Spec Grav, UA: 1.025
Urobilinogen, UA: 0.2 mg/dL
pH, UA: 6.5

## 2023-12-29 MED ORDER — CEFPODOXIME PROXETIL 200 MG PO TABS
200 | ORAL_TABLET | Freq: Two times a day (BID) | ORAL | 0 refills | Status: AC
Start: 2023-12-29 — End: 2024-01-05

## 2023-12-29 NOTE — Progress Notes (Signed)
 "    Susan [name] (DOB:  30-May-1965) is a 58 y.o. female,Established patient, here for evaluation of the following chief complaint(s):  Follow-Up from Hospital      Assessment & Plan  Pyelonephritis   Acute condition, new, will send out in office urine sample for culture and will start patient on cefpodoxime  for 7 days (Hx of sulfa allergy and listed intolerance to cipro). PmHX of SIADH limits pt's ability to tolerate increased fluid intake. POC urinalysis showing signs of ongoing infection as well as some trace proteinuria. Pt with new symptoms of abdominal discomfort and left sided flank pain in the setting of a recently diagnosed UTI is concerning for development of pyelonephritis. Labs obtained in ED were reviewed. Physical exam and vitals reassuring against infectious process, and cannot currently exclude kidney stone as possible cause for patient's symptoms. Patient has plans to travel out of the country at the end of this week; pending results of urine culture may adjust antibiotics. Should patients symptoms fail to improve over the next two days, would consider obtaining CT ABD/pelvis w/o contrast to evaluate for nephrolithiasis or other possible nephropathy.     Orders:    POCT Urinalysis no Micro    Culture, Urine    cefpodoxime  (VANTIN ) 200 MG tablet; Take 1 tablet by mouth 2 times daily for 7 days    Hospital discharge follow-up   Patient seen in the ED on 9/30 and diagnosed with UTI and discharged from ED with macrobid  x 5 days. Lab work and medications reviewed. See Pyelonephritis above for additional details.     Orders:    PR DISCHARGE MEDS RECONCILED W/ CURRENT OUTPATIENT MED LIST    Urine frequency   See above, under diagnosis of Pyelonephritis for additional details.     Orders:    POCT Urinalysis no Micro    Culture, Urine    cefpodoxime  (VANTIN ) 200 MG tablet; Take 1 tablet by mouth 2 times daily for 7 days    Abdominal pressure   See above, under diagnosis of Pyelonephritis for additional  details. Should patients symptoms fail to improve by 10/9, would plan on obtaining non-contrast CT of abdomen and pelvis for further evaluation.     Orders:    POCT Urinalysis no Micro    Culture, Urine    cefpodoxime  (VANTIN ) 200 MG tablet; Take 1 tablet by mouth 2 times daily for 7 days      Return if symptoms worsen or fail to improve.    Subjective       Patient is presenting to the office for a follow up after being seen in the ED on 12/22/23. Pt was diagnosed with a UTI at the time, and started on macrobid  for 5 days. Patient states that her symptoms began with urinary urgency and incontinence, with polyuria and hematuria. The hematuria resolved as the patient started antibiotics. Now, patient is reporting some lower abdominal discomfort and left sided back discomfort, as well as sensation of incomplete voiding. Pt reports that she is post-menopause, and that she has not had any recent UTI this year. PmHX is significant for SIADH.      Urinary Tract Infection  This is a new problem. The current episode started 1 to 4 weeks ago (8 days ago). The problem has been gradually worsening since onset. Associated symptoms include hematuria and pain. The pain is present in the suprapubic region and back.     Review of Systems   Constitutional:  Negative for chills and  fever.   Respiratory:  Negative for shortness of breath.    Cardiovascular:  Negative for chest pain.   Gastrointestinal:  Positive for abdominal pain. Negative for nausea and vomiting.   Genitourinary:  Positive for difficulty urinating, flank pain, hematuria and urgency.               Objective     BP 132/78 (BP Site: Left Upper Arm, Patient Position: Sitting)   Pulse 70   Ht 1.588 m (5' 2.5)   Wt 63.5 kg (140 lb)   LMP 07/19/2016   SpO2 98%   BMI 25.20 kg/m      Physical Exam  Constitutional:       General: She is not in acute distress.     Appearance: Normal appearance. She is not ill-appearing or toxic-appearing.   HENT:      Head:  Normocephalic and atraumatic.      Right Ear: External ear normal.      Left Ear: External ear normal.      Nose: Nose normal.   Eyes:      Extraocular Movements: Extraocular movements intact.      Conjunctiva/sclera: Conjunctivae normal.   Cardiovascular:      Rate and Rhythm: Normal rate and regular rhythm.      Pulses: Normal pulses.      Heart sounds: Normal heart sounds. No murmur heard.     No friction rub. No gallop.   Pulmonary:      Effort: Pulmonary effort is normal.      Breath sounds: Normal breath sounds. No wheezing, rhonchi or rales.   Abdominal:      General: Abdomen is flat. Bowel sounds are normal.      Palpations: Abdomen is soft.      Tenderness: There is abdominal tenderness (mildly in LLQ). There is left CVA tenderness.   Skin:     General: Skin is warm and dry.   Neurological:      General: No focal deficit present.      Mental Status: She is alert and oriented to person, place, and time.   Psychiatric:         Mood and Affect: Mood normal.         Behavior: Behavior normal.                      An electronic signature was used to authenticate this note.    --Calla Fox, DO   "

## 2023-12-29 NOTE — Patient Instructions (Signed)
"  Patient Education        Quitting Tobacco: Care Instructions  Quitting tobacco is much harder than simply changing a habit. Nicotine  cravings make it hard to quit, but you can do it. Your doctor will help you set up the plan that best meets your needs.    You will miss the nicotine  at first. You may feel short-tempered and grumpy. You may have trouble sleeping or thinking clearly. The urge to use tobacco may continue for a time.   Combining tools can raise your chances of success. You can use medicine along with counseling. And you can join a quit-tobacco program, such as the American Lung Association's Freedom from Smoking program.     Get support.  Reach out to family and friends, and find a counselor to help you quit. Join a support group, such as Nicotine  Anonymous. Go to www.smokefree.gov to sign up for text messaging support.     Talk to your doctor or pharmacist about medicines that can help you quit.  Medicines can help with cravings and withdrawal symptoms. There are several over-the-counter choices, such as nicotine  patches, gum, and lozenges.     After you quit, do not use tobacco again, not even once.  Get rid of all tobacco products and anything that reminds you of tobacco, such as ashtrays.     Avoid things that make you reach for tobacco.  Change your daily routine. Take a different route to work, or eat a meal in a different place.     Try to cut down on stress.  Find ways to calm yourself, such as taking a hot bath or doing deep breathing exercises.     Eat a healthy diet, and get regular exercise.  Having healthy habits may help you quit using tobacco.     Don't give up on quitting if you use tobacco again.  Most people quit and restart a few times before they quit for good.   Follow-up care is a key part of your treatment and safety. Be sure to make and go to all appointments, and call your doctor if you are having problems. It's also a good idea to know your test results and keep a list of the  medicines you take.  Where can you learn more?  Go to [employer] and enter Y522 to learn more about Quitting Tobacco: Care Instructions.  Current as of: November 11, 2022  Content Version: 14.6   2024-2025 [place], [place].   Care instructions adapted under license by [facility]. If you have questions about a medical condition or this instruction, always ask your healthcare professional. Romayne Alderman, [facility], disclaims any warranty or liability for your use of this information.       "

## 2023-12-31 NOTE — Telephone Encounter (Signed)
"  Called patient at listed mobile phone number to discuss urine culture results and check in with patient; phone call not answered and VM left. Depending on patients symptoms and pending antibiotic sensitivity report, may consider switching from cefpodoxime  to levaquin or fosfomycin.   "

## 2024-01-01 LAB — CULTURE, URINE: Urine Culture, Routine: >100000

## 2024-01-01 NOTE — Telephone Encounter (Signed)
"  Patient is calling back she said she will turn on her phone an be waiting for someone to call her about her test results and she asking if she is going to be on any new medication     Please advise   Patient name: Susan [name]   [phone]  "

## 2024-01-01 NOTE — Telephone Encounter (Signed)
"  Patient is calling and want her test results and she was asking if you leave it on her voicemail and she will be out of the country and so she would like a phone call back before she leaves .    Please advise   Patient name: Susan [name]   Ey:[phone]  "

## 2024-01-19 ENCOUNTER — Ambulatory Visit: Admit: 2024-01-19 | Discharge: 2024-01-19 | Payer: PRIVATE HEALTH INSURANCE

## 2024-01-19 ENCOUNTER — Inpatient Hospital Stay: Admit: 2024-01-19 | Payer: PRIVATE HEALTH INSURANCE

## 2024-01-19 VITALS — BP 128/80 | HR 76 | Temp 97.50000°F | Resp 18 | Ht 62.5 in | Wt 140.2 lb

## 2024-01-19 DIAGNOSIS — R10A2 Flank pain, left side: Principal | ICD-10-CM

## 2024-01-19 DIAGNOSIS — E222 Syndrome of inappropriate secretion of antidiuretic hormone: Principal | ICD-10-CM

## 2024-01-19 LAB — POCT URINALYSIS DIPSTICK
Bilirubin, UA: NEGATIVE
Glucose, UA POC: NEGATIVE mg/dL
Ketones, UA: NEGATIVE mg/dL
Leukocytes, UA: NEGATIVE
Nitrite, UA: NEGATIVE
Protein, UA POC: NEGATIVE mg/dL
Spec Grav, UA: 1.015
Urobilinogen, UA: 0.2 mg/dL
pH, UA: 0

## 2024-01-19 NOTE — Progress Notes (Signed)
 "PROGRESS NOTE   [facility]                                  [address], [place] [address]         Phone: [phone]    Date of Service:  01/19/2024     Patient ID: .Susan [name] is a 58 y.o. female      Subjective:     CC: expieriencing symptom of previous condition. lower back pain patient would also like to get flu shot     HPI  The patient presents today for recurrent urinary symptoms following prior evaluation and treatment for a urinary tract infection. She was initially seen in the ED on 12/22/23, where she was diagnosed with a UTI and prescribed a 5-day course of nitrofurantoin  (Macrobid ). At that time, symptoms included urinary urgency, incontinence, polyuria, and hematuria; the hematuria resolved shortly after initiating antibiotics.    Approximately 10 days later, she followed up in the clinic with persistent lower abdominal discomfort, left-sided back pain, and a sensation of incomplete bladder emptying. Office urinalysis demonstrated ongoing signs of infection with trace proteinuria, and she was prescribed cefpodoxime  for 7 days (given her sulfa allergy and ciprofloxacin intolerance). Given her new flank pain and recent UTI, there was concern for developing pyelonephritis vs. nephrolithiasis, though she remained afebrile with reassuring vitals.    The patient returned from her trip on/around October 18th. Patient states her symptoms improved while on the trip. Since then no hematuria, with no increase in frequency. The patient states she continues to have left lower back pain, despite the improvement in urinary frequency, urgency, suprapubic pressure. She denies fever, chills, nausea, vomiting, or visible hematuria. She is postmenopausal and has no vaginal discharge or bleeding. Her past medical history includes SIADH, which limits her ability to increase fluid intake.     Patient recently had a CT of abdomen  in [place] (2012) for hematuria which showed mild to moderate abdominal aortic calcification. Patient is a smoker    ROS  Negative unless stated otherwise        Vitals:    01/19/24 1557   BP: 128/80   BP Site: Right Upper Arm   Patient Position: Sitting   BP Cuff Size: Small Adult   Pulse: 76   Resp: 18   Temp: 97.5 F (36.4 C)   TempSrc: Oral   SpO2: 98%   Weight: 63.6 kg (140 lb 3.2 oz)   Height: 1.588 m (5' 2.5)       Allergies:  Food, Other, Prednazoline, Cipro xr, and Sulfa antibiotics    Outpatient Medications Marked as Taking for the 01/19/24 encounter (Office Visit) with Geralene Meter, MD   Medication Sig Dispense Refill    metoprolol  succinate (TOPROL  XL) 25 MG extended release tablet Take 1.5 tablets by mouth daily Take 1-1/2 (1.5) tablets a day. 135 tablet 3    azelastine  (ASTELIN ) 0.1 % nasal spray 2 sprays by Nasal route 2 times daily Use in each nostril as directed 120 mL 1    EPINEPHrine  (EPIPEN ) 0.3 MG/0.3ML SOAJ injection USE 1 PEN INTRAMUSCULARLY AR NEEDED ONLY FOR SEVERE REACTION FOR 1 DAY 2 each 0    levocetirizine (XYZAL ) 5 MG tablet Take 1 tablet by mouth nightly 90 tablet 3    mometasone  (NASONEX ) 50 MCG/ACT nasal spray 2 sprays by  Nasal route daily 3 each 3    montelukast  (SINGULAIR ) 10 MG tablet Take 1 tablet by mouth nightly 90 tablet 3    olmesartan  (BENICAR ) 40 MG tablet Take 1 tablet by mouth daily 90 tablet 3    pantoprazole  (PROTONIX ) 40 MG tablet Take 1 tablet by mouth daily 90 tablet 3    rosuvastatin  (CRESTOR ) 20 MG tablet Take 1 tablet by mouth nightly 90 tablet 3    sodium chloride  1 g tablet Take 1 tablet by mouth 2 times daily (with meals) 180 tablet 3    aspirin  81 MG EC tablet Take 1 tablet by mouth daily           Objective:   Constitutional:   Reviewed vitals above  Well Nourished, well developed, no distress       HENT:  Normal external nose without lesions  Resp:  Normal effort  Clear to auscultation bilaterally without rhonchi, wheezing or crackles  Cardiovascular:  On  auscultation, normal S1 and S2 without murmurs, rubs or gallops  Gastrointestinal:  Nontender, nondistended, and no masses  Musculoskeletal:  Normal Gait  All extremities without clubbing, cyanosis or edema  CVA tenderness present on the left side  Skin:  No rashes on inspection  No areas of increased heat or induration on palpation  Psych:  Normal mood and affect  Normal insight and judgement       Assessment / Plan:     1. 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 To assess for any kidney dysfunction or electrolyte imbalances (especially Sodium)   - Basic Metabolic Panel; Future    2. Recurrent urinary tract infection  - The patient reports improvement in urinary frequency, urgency, and suprapubic pressure, with no increase in frequency and no visible hematuria.  - The left CVA tenderness persists, but it seems isolated to the back, and she denies any signs of infection or fever.  - POC UA positive for blood, negative for leukocytes and nitrites.   Plan:  - Send urine for culture.  - Encourage hydration as tolerated within SIADH restrictions.  - Renal/bladder ultrasound to evaluate for incomplete bladder emptying or structural abnormalities.    3. Left flank pain  - Chronic, ongoing  - Not at goal  -The persistent left lower back pain without other systemic symptoms (fever, chills) could be due to nephrolithiasis (stones) or musculoskeletal pain. Concerning for possibly pyelonephritis, considering presence of blood on POC UA  Plan:  - Ultrasound of the kidneys and bladder to evaluate for nephrolithiasis or other renal issues (e.g., structural abnormalities).  - If stones are detected, treatment could range from conservative management (hydration, pain control) to possible intervention if stones are large or symptomatic.  Orders placed:  - US  RENAL COMPLETE; Future  - Culture, Urine  - POCT Urinalysis no Micro    4. Healthcare maintenance  - Influenza, FLUCELVAX  Trivalent, (age 79 mo+) IM, Preservative Free, 0.5mL      EMR Dragon/transcription disclaimer:  Much of this encounter note is electronic transcription/translation of spoken language to printed texts.  The electronic translation of spoken language may be erroneous, or at times, nonsensical words or phrases may be inadvertently transcribed.  Although I have reviewed the note for such errors, some may still exist.   "

## 2024-01-20 LAB — BASIC METABOLIC PANEL
Anion Gap: 6 (ref 3–16)
BUN: 6 mg/dL — ABNORMAL LOW (ref 7–20)
CO2: 30 mmol/L (ref 21–32)
Calcium: 9.3 mg/dL (ref 8.3–10.6)
Chloride: 98 mmol/L — ABNORMAL LOW (ref 99–110)
Creatinine: 0.8 mg/dL (ref 0.6–1.1)
Est, Glom Filt Rate: 86
Glucose: 92 mg/dL (ref 70–99)
Potassium: 4.5 mmol/L (ref 3.5–5.1)
Sodium: 134 mmol/L — ABNORMAL LOW (ref 136–145)

## 2024-01-20 NOTE — Result Encounter Note (Signed)
"  Your electrolytes results came back. Your kidney function looks good. The rest of the electrolytes, including sodium looks stable/slightly improved from your previous last 2-3 labs.   "

## 2024-01-21 LAB — CULTURE, URINE: Urine Culture, Routine: <10000

## 2024-01-26 ENCOUNTER — Inpatient Hospital Stay: Admit: 2024-01-26 | Payer: PRIVATE HEALTH INSURANCE

## 2024-01-26 DIAGNOSIS — R10A2 Flank pain, left side: Secondary | ICD-10-CM

## 2024-01-27 ENCOUNTER — Encounter

## 2024-01-27 MED ORDER — SODIUM CHLORIDE 1 G PO TABS
1 | ORAL_TABLET | Freq: Two times a day (BID) | ORAL | 3 refills | Status: AC
Start: 2024-01-27 — End: ?

## 2024-01-27 NOTE — Telephone Encounter (Signed)
"  Refill Request       Last Seen: Last Seen Department: 01/19/2024  Last Seen by PCP: 01/19/2024    Last Written: 01/09/23 180 3 refills    Next Appointment:   No future appointments.          Requested Prescriptions     Pending Prescriptions Disp Refills    sodium chloride  1 g tablet 180 tablet 3     Sig: Take 1 tablet by mouth 2 times daily (with meals)     "

## 2024-01-28 NOTE — Result Encounter Note (Signed)
"  Your ultrasound of the kidney are normal per report. No kidney stones noted on the imaging.   "

## 2024-03-04 ENCOUNTER — Encounter

## 2024-03-04 MED ORDER — LEVOCETIRIZINE DIHYDROCHLORIDE 5 MG PO TABS
5 | ORAL_TABLET | Freq: Every evening | ORAL | 0 refills | 90.00000 days | Status: DC
Start: 2024-03-04 — End: 2024-03-21

## 2024-03-04 MED ORDER — MONTELUKAST SODIUM 10 MG PO TABS
10 | ORAL_TABLET | Freq: Every evening | ORAL | 0 refills | 90.00000 days | Status: DC
Start: 2024-03-04 — End: 2024-03-21

## 2024-03-04 MED ORDER — ROSUVASTATIN CALCIUM 20 MG PO TABS
20 | ORAL_TABLET | Freq: Every evening | ORAL | 0 refills | 90.00000 days | Status: DC
Start: 2024-03-04 — End: 2024-03-21

## 2024-03-04 MED ORDER — OLMESARTAN MEDOXOMIL 40 MG PO TABS
40 | ORAL_TABLET | Freq: Every day | ORAL | 0 refills | 90.00000 days | Status: DC
Start: 2024-03-04 — End: 2024-03-21

## 2024-03-04 MED ORDER — PANTOPRAZOLE SODIUM 40 MG PO TBEC
40 | ORAL_TABLET | Freq: Every day | ORAL | 0 refills | 30.00000 days | Status: DC
Start: 2024-03-04 — End: 2024-03-21

## 2024-03-04 NOTE — Telephone Encounter (Signed)
"  Refill for 30 days.  Patient needs follow-up for further refills.  "

## 2024-03-04 NOTE — Telephone Encounter (Signed)
"  Lvm for patient to call office back  "

## 2024-03-04 NOTE — Telephone Encounter (Signed)
"  Refill Request       Last Seen: Last Seen Department: 01/19/2024  Last Seen by PCP: 01/19/2024    Last Written:     levocetirizine (XYZAL ) 5 MG tablet   -01/09/23 90 3 refills   montelukast  (SINGULAIR ) 10 MG tablet -01/09/23 90 3 refills   olmesartan  (BENICAR ) 40 MG tablet -01/09/23 90 3 refills     pantoprazole  (PROTONIX ) 40 MG tablet   -01/09/23 90 3 refills   rosuvastatin  (CRESTOR ) 20 MG tablet -01/09/23 90 3 refills   Next Appointment:   No future appointments.          Requested Prescriptions     Pending Prescriptions Disp Refills    rosuvastatin  (CRESTOR ) 20 MG tablet 90 tablet 3     Sig: Take 1 tablet by mouth nightly    pantoprazole  (PROTONIX ) 40 MG tablet 90 tablet 3     Sig: Take 1 tablet by mouth daily    olmesartan  (BENICAR ) 40 MG tablet 90 tablet 3     Sig: Take 1 tablet by mouth daily    montelukast  (SINGULAIR ) 10 MG tablet 90 tablet 3     Sig: Take 1 tablet by mouth nightly    levocetirizine (XYZAL ) 5 MG tablet 90 tablet 3     Sig: Take 1 tablet by mouth nightly     "

## 2024-03-07 NOTE — Telephone Encounter (Signed)
"  Spoke with pt !    "

## 2024-03-07 NOTE — Telephone Encounter (Signed)
"  LVM requesting pt to return call.   "

## 2024-03-21 ENCOUNTER — Ambulatory Visit: Admit: 2024-03-21 | Discharge: 2024-03-21 | Payer: PRIVATE HEALTH INSURANCE

## 2024-03-21 ENCOUNTER — Inpatient Hospital Stay: Admit: 2024-03-21 | Payer: PRIVATE HEALTH INSURANCE

## 2024-03-21 VITALS — BP 132/70 | HR 80 | Temp 98.00000°F | Ht 62.5 in | Wt 138.0 lb

## 2024-03-21 DIAGNOSIS — E782 Mixed hyperlipidemia: Secondary | ICD-10-CM

## 2024-03-21 DIAGNOSIS — M549 Dorsalgia, unspecified: Principal | ICD-10-CM

## 2024-03-21 MED ORDER — METOPROLOL SUCCINATE ER 25 MG PO TB24
25 | ORAL_TABLET | Freq: Every day | ORAL | 0 refills | 90.00000 days | Status: AC
Start: 2024-03-21 — End: 2024-06-19

## 2024-03-21 MED ORDER — PANTOPRAZOLE SODIUM 40 MG PO TBEC
40 | ORAL_TABLET | Freq: Every day | ORAL | 2 refills | 30.00000 days | Status: AC
Start: 2024-03-21 — End: ?

## 2024-03-21 MED ORDER — LEVOCETIRIZINE DIHYDROCHLORIDE 5 MG PO TABS
5 | ORAL_TABLET | Freq: Every evening | ORAL | 2 refills | 90.00000 days | Status: AC
Start: 2024-03-21 — End: 2024-06-19

## 2024-03-21 MED ORDER — MONTELUKAST SODIUM 10 MG PO TABS
10 | ORAL_TABLET | Freq: Every evening | ORAL | 0 refills | 90.00000 days | Status: DC
Start: 2024-03-21 — End: 2024-04-27

## 2024-03-21 MED ORDER — BENZONATATE 100 MG PO CAPS
100 | ORAL_CAPSULE | Freq: Three times a day (TID) | ORAL | 0 refills | 10.00000 days | Status: AC | PRN
Start: 2024-03-21 — End: 2024-03-31

## 2024-03-21 MED ORDER — MOMETASONE FUROATE 50 MCG/ACT NA SUSP
50 | Freq: Every day | NASAL | 3 refills | 30.00000 days | Status: AC
Start: 2024-03-21 — End: 2025-03-21

## 2024-03-21 MED ORDER — OLMESARTAN MEDOXOMIL 40 MG PO TABS
40 | ORAL_TABLET | Freq: Every day | ORAL | 0 refills | 90.00000 days | Status: AC
Start: 2024-03-21 — End: 2024-06-19

## 2024-03-21 MED ORDER — ROSUVASTATIN CALCIUM 20 MG PO TABS
20 | ORAL_TABLET | Freq: Every evening | ORAL | 0 refills | 90.00000 days | Status: AC
Start: 2024-03-21 — End: 2024-06-19

## 2024-03-21 NOTE — Progress Notes (Unsigned)
"  PROGRESS NOTE   [facility]                                  [address], [address], [place] [address]         Phone: [phone]    Date of Service:  03/21/2024     Patient ID: .Susan [name] is a 58 y.o. female      Subjective:     CC: ***    HPI  1. 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 To assess for any kidney dysfunction or electrolyte imbalances (especially Sodium)   - Basic Metabolic Panel; Future     2. Recurrent urinary tract infection  - The patient reports improvement in urinary frequency, urgency, and suprapubic pressure, with no increase in frequency and no visible hematuria.  - The left CVA tenderness persists, but it seems isolated to the back, and she denies any signs of infection or fever.  - POC UA positive for blood, negative for leukocytes and nitrites.   Plan:  - Send urine for culture.  - Encourage hydration as tolerated within SIADH restrictions.  - Renal/bladder ultrasound to evaluate for incomplete bladder emptying or structural abnormalities.     3. Left flank pain  - Chronic, ongoing  - Not at goal  -The persistent left lower back pain without other systemic symptoms (fever, chills) could be due to nephrolithiasis (stones) or musculoskeletal pain. Concerning for possibly pyelonephritis, considering presence of blood on POC UA  Plan:  - Ultrasound of the kidneys and bladder to evaluate for nephrolithiasis or other renal issues (e.g., structural abnormalities).  - If stones are detected, treatment could range from conservative management (hydration, pain control) to possible intervention if stones are large or symptomatic.  Orders placed:  - US  RENAL COMPLETE; Future  - Culture, Urine  - POCT Urinalysis no Micro    Neck pain - few years - asking for PT referral    ROS:    {ROS FCMRP:62406}        There were no vitals filed for this visit.    Allergies:  Food,  Other, Prednazoline, Cipro xr, and Sulfa antibiotics    No outpatient medications have been marked as taking for the 03/21/24 encounter (Appointment) with Cloria Lamar Jerilynn Arthea, MD.         Objective:   {physical exam QRFME:37630}        Assessment / Plan:     There are no diagnoses linked to this encounter.      Health Maitenance: ***        {resident attestation to med student note:[id number]}    EMR Dragon/transcription disclaimer:  Much of this encounter note is electronic transcription/translation of spoken language to printed texts.  The electronic translation of spoken language may be erroneous, or at times, nonsensical words or phrases may be inadvertently transcribed.  Although I have reviewed the note for such errors, some may still exist.     "

## 2024-03-22 LAB — LIPID PANEL
Cholesterol, Total: 121 mg/dL (ref 0–199)
HDL: 47 mg/dL (ref 40–60)
LDL Cholesterol: 39 mg/dL (ref <100)
Triglycerides: 173 mg/dL — ABNORMAL HIGH (ref 0–150)
VLDL Cholesterol Calculated: 35 mg/dL

## 2024-03-22 LAB — HEMOGLOBIN A1C
Estimated Avg Glucose: 119.8 mg/dL
Hemoglobin A1C: 5.8 %

## 2024-04-07 ENCOUNTER — Inpatient Hospital Stay: Admit: 2024-04-07 | Discharge: 2024-04-07 | Payer: PRIVATE HEALTH INSURANCE

## 2024-04-07 DIAGNOSIS — M542 Cervicalgia: Principal | ICD-10-CM

## 2024-04-07 NOTE — Plan of Care (Signed)
 "     [facility]: [address], [place], [place] [address] office: [phone] fax: [phone]     Physical Therapy Initial Evaluation Certification      Dear Susan [name]* ,    We had the pleasure of evaluating the following patient for physical therapy services at [facility].  A summary of our findings can be found in the initial assessment below.  This includes our plan of care.  If you have any questions or concerns regarding these findings, please do not hesitate to contact me at the office phone number listed above.  Thank you for the referral.     Physician Signature:_______________________________Date:__________________  By signing above (or electronic signature), therapists plan is approved by physician       Physical Therapy: TREATMENT/PROGRESS NOTE   Patient: Susan [name] (59 y.o. female)   Examination Date: 04/07/2024   DOB:  18-Jul-1965 MRN: [id number]   Visit #: 1   Hard Visit Max? Yes: see media Visits  Auth Needed? NA  Auth Notes:    Insurance: Payor: AETNA / Plan: AETNA / Product Type: *No Product type* /   Insurance ID: [id number] - [employer])  Secondary Insurance (if applicable):    Primary Treatment Diagnosis (CHOOSE ONLY ONE):  M54.2    NECK PAIN    Secondary Treatment Diagnoses:       Medical Diagnosis:  Mixed hyperlipidemia [E78.2]  Essential hypertension [I10]  Gastroesophageal reflux disease, unspecified whether esophagitis present [K21.9]  Seasonal allergies [J30.2]  Sinus congestion [R09.81]  Chronic midline back pain, unspecified back location [M54.9, G89.29]  Chronic neck pain [M54.2, G89.29]   Referring Physician: Cloria Susan Jerilynn JENEANE*  PCP: Geralene Meter, MD     Date of Patient follow up with Physician:      Plan of Care Report: YES, Date Range for this report: 04/07/24 to 07/06/24  POC update due: (10 visits /OR AUTH LIMITS, whichever is less)  05/06/2024                                              Medical History:  Comorbidities:  Hypertension  Osteoarthritis  Relevant Medical History: na                                         Precautions/ Contra-indications:           Latex allergy:  NO  Pacemaker:    NO  Contraindications for Manipulation: None  Date of Surgery: na  Other:    Red Flags:  None    Suicide Screening:   The patient did not verbalize a primary behavioral concern, suicidal ideation, suicidal intent, or demonstrate suicidal behaviors.    Preferred Language for Healthcare:   [x]  English       []  other:    SUBJECTIVE EXAMINATION     Patient stated complaint: Pt reports chronic neck pain with insidious onset for years. Pt has not received imaging recently but states she does have a history of DDD and nerve impingements. Pt reports she has a physical job requiring heavy lifting. Pt denies n/t at this time. Pt reports her L periscapular area hurts the most.        Test used  Initial score  04/07/24 04/07/2024   Pain Summary VAS 2-8    Functional questionnaire Neck Disability Index 26    Other:              Pain:  Pain location: L sided neck  Patient describes pain to be constant  Pain decreases with: Resting  Pain increases with: Activity and Movement     Living status: na    Current Functional Limitations:    Functional Complaints:  pain with head movement      PLOF:  No functional limitations  Pt's sleep is affected?   NO    Occupation/School:  Work/School Status: Full time  Job Duties/Demands: Heavy lifting  Repetitive lifting    Hand Dominance: NA    Sport/ Recreation/ Leisure/ Hobbies:     Review Of Systems (ROS):  [x]  Performed Review of systems (Integumentary, CardioPulmonary, Neurological) by intake and observation. Intake form is in the medical record. Patient has been instructed to contact their primary care physician regarding ROS issues if not already being addressed at this time.    [x]  Patient history, allergies, meds reviewed. Medical chart reviewed. See  intake form.     OBJECTIVE EXAMINATION     04/07/24  ROM/Strength: (Blank cells denote NT)      Mvmt (norm) AROM L AROM R PROM L PROM R Notes MMT L MMT R Notes     CERVICAL Flex (60) 40        Ext (70) 50        SB(45) 30 45          Rotation (80) 65 70             SHOULDER Flexion (180) WNL all planes WNL all planes    5/5 all planes 5/5 all planes     Abduction (180)            ER -0            ER -90 (90)            IR -0            IR -90 (70)             ELBOW Flex/biceps (140)            Ext/triceps (0)            Pronation (80)            Supination (80)               WRIST Flexion (60)            Extension (60)            RD (20)            UD (20)            Grip N/a N/a N/a N/a           Palpation:   Patient reported tenderness with palpation  Location: L UT    Posture:   flattened cervical lordosis, forward head, and rounded shoulders    Bandages/Dressings/Incisions:  Not Applicable    Dermatomes: Abnormal findings listed below  None, all WNL    Myotomes: Abnormal findings listed below  None, all WNL    Reflexes: Abnormal findings listed below  All reflexes WNL (2+)    Specific Joint Mobility Testing/Accessory Motions:      Cervical: hypomobility of R side L side      Gait:  Pattern: WNL  Assistive Device Used: no AD    Special Tests:  NT    Balance:  NT    Falls Risk Assessment (30 days):   Falls Risk assessed and no intervention required.  Time Up and Go (TUG):   Not Assessed        Exercises/Interventions     Therapeutic Ex (02889)  resistance Sets/time Reps Notes/Cues/Progressions   Chin tuck  2 10    Neck extension with towel  2 10    Levator S  10'' 5    Neck rotations in supine   2 10    Wall slides  2 10    Scap pinches  2 10                                Manual Intervention (97140)  TIME     C-spine PA mobs grade III-IV  Lateral glides   Manual traction   PROM  STM   MFR       10'                                 NMR re-education [phone]) resistance Sets/time Reps CUES NEEDED                                       Therapeutic Activity [phone])  Sets/time                                          Modalities:    No modalities applied this session    Education/Home Exercise Program: Patient HEP program created electronically.  Refer to Medbridge access code:        ASSESSMENT   Assessment:   IYONA PEHRSON is a 59 y.o. female presenting today to Outpatient PT with signs and symptoms consistent with neck pain.    Pt. presents with the functional impairments and activity limitations listed below and would benefit from Outpatient PT to address the below impairments as well as improve pain, and restore function.     Functional Impairments:   Noted cervical/thoracic/Glenohumeral joint hypomobility  Decreased cervical/UE functional ROM  Decreased Rotator Cuff/Scapular/Core strength and neuromuscular control  Decreased UE functional strength    Functional Activity Limitations (from functional questionnaire and intake):   Any of your usual work, housework, or school activities  Your usual hobbies, recreational or sporting activities     Participation Restrictions:   Reduced participation in self care  Reduced participation in home management   Reduced participation in work activities  Reduced participation in social activities  Reduced participation in sports/recreation    Classification :   Signs/symptoms consistent with facet dysfunction of cervical and thoracic spine  Signs/symptoms consistent with discogenic cervical pain  Signs/symptoms consistent with postural dysfunction       Barriers to/and or personal factors that will affect rehab potential:   None noted    Physical Therapy Evaluation Complexity Justification  [x]  A history of present problem and 1-2 personal factors and/or co-morbidities that impact the plan of care [Mod]  [x]  A total of 3 elements [Mod] found upon examination of body systems using standardized tests and measures addressing any  of the following: body structures, functions (impairments),  activity limitations, and/or participation restrictions  [x]  A clinical presentation with stable and/or uncomplicated characteristics [Low]  [x]  Clinical decision making of LOW (02838 - Typically 20 minutes face-to-face) complexity using standardized patient assessment instrument and/or measurable assessment of functional outcome.    Today's Assessment:   See above    Medical Necessity Documentation:  I certify that this patient meets the below criteria necessary for medical necessity for care and/or justification of therapy services:  The patient has functional impairments and/or activity limitations and would benefit from continued outpatient therapy services to address the deficits outlined in the patients goals    Return to Play: NA    Prognosis for POC: [x]  Good []  Fair  []  Poor    Patient requires continued skilled intervention: [x]  Yes  []  No      CHARGE CAPTURE     PT CHARGE GRID   CPT Code (TIMED) minutes # CPT Code (UNTIMED) #     Therex (97110)  15 1  EVAL:LOW (97161 - Typically 20 minutes face-to-face) 1    Neuromusc. Re-ed (02887)    Re-Eval (02835)     Manual (97140) 10 1  Estim Unattended (97014)     Ther. Act (02469)    Mech. Traction [phone])     Gait [phone])    Dry Needle 1-2 muscle (79439)     Aquatic Therex [phone])    Dry Needle 3+ muscle (79438)     Iontophoresis (02966)    VASO (02983)     Ultrasound (02964)    Group Therapy (02849)     Estim Attended (02967)    Canalith Repositioning (04007)     Physical Performance Test [phone])    Custom orthotic (O6979)     Other:    Other:    Total Timed Code Tx Minutes 25        Total Treatment Minutes 45        Charge Justification:  (02889) THERAPEUTIC EXERCISE - Provided verbal/tactile cueing for HEP and/or activities related to strengthening, flexibility, endurance, ROM performed to prevent loss of range of motion, maintain or improve muscular strength or increase flexibility, following either an injury or surgery.   [phone]) MANUAL THERAPY -  Manual  therapy techniques, 1 or more regions, each 15 minutes (Mobilization/manipulation, manual lymphatic drainage, manual traction) for the purpose of modulating pain, promoting relaxation,  increasing ROM, reducing/eliminating soft tissue swelling/inflammation/restriction, improving soft tissue extensibility and allowing for proper ROM for normal function with self care, mobility, lifting and ambulation    GOALS     Patient stated goal: Pt would like to return to pain free recreational activities.   []  Progressing: []  Met: []  Not Met: []  Adjusted    Therapist goals for Patient:   Short Term Goals: To be achieved in: 2 weeks  Independent in HEP and progression per patient tolerance, in order to prevent re-injury.   []  Progressing: []  Met: []  Not Met: []  Adjusted  Patient will have a decrease in pain to <2/10 to facilitate improvement in movement, function, and ADLs as indicated by Functional Deficits.  []  Progressing: []  Met: []  Not Met: []  Adjusted    IF APPLICABLE:  []  Patient to demonstrate independence in wear and care for custom orthotic device. (Only if applicable for orthotic eval)     Long Term Goals: To be achieved in: 12 weeks  Disability index score of 10% or less for the Neck Disability Index to assist with  reaching prior level of function with activities such as driving.  []  Progressing: []  Met: []  Not Met: []  Adjusted  Patient will demonstrate increased AROM of cervical spine to WNL without pain to allow for proper joint functioning to enable patient to improve sleep.   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   []  Progressing: []  Met: []  Not Met: []  Adjusted  Patient will return to ADL's without increased symptoms or restriction.   []  Progressing: []  Met: []  Not Met: []  Adjusted  Pt will demonstrate  the ability to lift 10lbs overhead with 0/10 pain. (patient specific functional goal)    []  Progressing: []  Met: []  Not Met: []  Adjusted       Overall Progression Towards Functional goals/ Treatment Progress Update:  []  Patient is progressing as expected towards functional goals listed.    []  Progression is slowed due to complexities/Impairments listed.  []  Progression has been slowed due to co-morbidities.  [x]  Plan just implemented, too soon (<30days) to assess goals progression   []  Goals require adjustment due to lack of progress  []  Patient is not progressing as expected and requires additional follow up with physician  []  Other:     TREATMENT PLAN     Frequency/Duration: 1-2x/week for 12 weeks for the following treatment interventions:    Interventions:  Therapeutic Exercise (97110) including: strength training, ROM, and functional mobility  Therapeutic Activities (97530) including: functional mobility training and education.  Neuromuscular Re-education [phone]) activation and proprioception, including postural re-education.    Gait Training (02883) for normalization of ambulation patterns and AD training.   Manual Therapy [phone]) as indicated to include: Passive Range of Motion, Gr I-IV mobilizations, Grade V Manipulation, Soft Tissue Mobilization, Dry Needling/IASTM, Trigger Point Release, and Myofascial Release  Modalities as needed that may include: Cryotherapy, Electrical Stimulation, Thermal Agents, and Vasoneumatic Compression  Patient education on joint protection, postural re-education, activity modification, and progression of HEP    Plan: POC initiated as per evaluation    Electronically Signed by Lorrene CHRISTELLA [place], PT  Date: 04/07/2024   Note: Portions of this note have been templated and/or copied from initial evaluation, reassessments and prior notes for documentation efficiency.  Note: If patient does not return for scheduled/recommended follow up visits, this note will serve as a discharge from  care along with the most recent update on progress.    Ortho Evaluation     "

## 2024-04-19 ENCOUNTER — Inpatient Hospital Stay: Payer: PRIVATE HEALTH INSURANCE

## 2024-04-27 ENCOUNTER — Encounter

## 2024-04-27 MED ORDER — MONTELUKAST SODIUM 10 MG PO TABS
10 | ORAL_TABLET | Freq: Every evening | ORAL | 0 refills | Status: AC
Start: 2024-04-27 — End: ?

## 2024-04-27 NOTE — Telephone Encounter (Signed)
 Refill Request       Last Seen: Last Seen Department: 03/21/2024  Last Seen by PCP: 03/21/2024    Last Written: 03/21/24 30 with 0    Next Appointment:   Future Appointments   Date Time Provider Department Center   06/20/2024  3:40 PM Geralene Meter, MD [facility]       Future appointment scheduled      Requested Prescriptions     Pending Prescriptions Disp Refills    montelukast  (SINGULAIR ) 10 MG tablet [Pharmacy Med Name: Montelukast  Sodium 10 MG Oral Tablet] 30 tablet 0     Sig: Take 1 tablet by mouth nightly
